# Patient Record
Sex: Female | Born: 1960 | Race: Black or African American | Hispanic: No | Smoking: Former smoker
Health system: Southern US, Community
[De-identification: ages and names within clinical notes are randomized; demographics above are authoritative.]

## PROBLEM LIST (undated history)

## (undated) DIAGNOSIS — C50919 Malignant neoplasm of unspecified site of unspecified female breast: Principal | ICD-10-CM

## (undated) DIAGNOSIS — H539 Unspecified visual disturbance: Secondary | ICD-10-CM

## (undated) DIAGNOSIS — Z923 Personal history of irradiation: Secondary | ICD-10-CM

## (undated) DIAGNOSIS — R05 Cough: Secondary | ICD-10-CM

## (undated) DIAGNOSIS — L309 Dermatitis, unspecified: Secondary | ICD-10-CM

## (undated) DIAGNOSIS — R252 Cramp and spasm: Secondary | ICD-10-CM

## (undated) DIAGNOSIS — R197 Diarrhea, unspecified: Secondary | ICD-10-CM

## (undated) DIAGNOSIS — R002 Palpitations: Secondary | ICD-10-CM

## (undated) DIAGNOSIS — F419 Anxiety disorder, unspecified: Secondary | ICD-10-CM

## (undated) DIAGNOSIS — F329 Major depressive disorder, single episode, unspecified: Secondary | ICD-10-CM

## (undated) DIAGNOSIS — K5790 Diverticulosis of intestine, part unspecified, without perforation or abscess without bleeding: Secondary | ICD-10-CM

## (undated) DIAGNOSIS — R059 Cough, unspecified: Secondary | ICD-10-CM

## (undated) DIAGNOSIS — R062 Wheezing: Secondary | ICD-10-CM

## (undated) DIAGNOSIS — J45909 Unspecified asthma, uncomplicated: Secondary | ICD-10-CM

## (undated) DIAGNOSIS — R7303 Prediabetes: Secondary | ICD-10-CM

## (undated) DIAGNOSIS — F32A Depression, unspecified: Secondary | ICD-10-CM

## (undated) DIAGNOSIS — I10 Essential (primary) hypertension: Secondary | ICD-10-CM

## (undated) HISTORY — DX: Essential (primary) hypertension: I10

## (undated) HISTORY — DX: Malignant neoplasm of unspecified site of unspecified female breast: C50.919

## (undated) HISTORY — DX: Anxiety disorder, unspecified: F41.9

## (undated) HISTORY — DX: Diverticulosis of intestine, part unspecified, without perforation or abscess without bleeding: K57.90

## (undated) HISTORY — DX: Palpitations: R00.2

## (undated) HISTORY — DX: Diarrhea, unspecified: R19.7

## (undated) HISTORY — DX: Cough: R05

## (undated) HISTORY — DX: Unspecified visual disturbance: H53.9

## (undated) HISTORY — DX: Wheezing: R06.2

## (undated) HISTORY — DX: Dermatitis, unspecified: L30.9

## (undated) HISTORY — DX: Cough, unspecified: R05.9

## (undated) HISTORY — DX: Cramp and spasm: R25.2

---

## 2001-08-13 ENCOUNTER — Ambulatory Visit (HOSPITAL_COMMUNITY): Admission: RE | Admit: 2001-08-13 | Discharge: 2001-08-13 | Payer: Self-pay | Admitting: Family Medicine

## 2001-08-13 ENCOUNTER — Encounter: Payer: Self-pay | Admitting: Family Medicine

## 2002-01-13 ENCOUNTER — Encounter: Payer: Self-pay | Admitting: Family Medicine

## 2002-01-13 ENCOUNTER — Ambulatory Visit (HOSPITAL_COMMUNITY): Admission: RE | Admit: 2002-01-13 | Discharge: 2002-01-13 | Payer: Self-pay | Admitting: Family Medicine

## 2003-02-03 ENCOUNTER — Other Ambulatory Visit: Payer: Self-pay

## 2004-08-02 ENCOUNTER — Ambulatory Visit (HOSPITAL_COMMUNITY): Admission: RE | Admit: 2004-08-02 | Discharge: 2004-08-02 | Payer: Self-pay | Admitting: Family Medicine

## 2004-12-12 ENCOUNTER — Ambulatory Visit (HOSPITAL_COMMUNITY): Admission: RE | Admit: 2004-12-12 | Discharge: 2004-12-12 | Payer: Self-pay | Admitting: Family Medicine

## 2004-12-15 ENCOUNTER — Ambulatory Visit (HOSPITAL_COMMUNITY): Admission: RE | Admit: 2004-12-15 | Discharge: 2004-12-15 | Payer: Self-pay | Admitting: Family Medicine

## 2005-01-09 ENCOUNTER — Encounter: Admission: RE | Admit: 2005-01-09 | Discharge: 2005-01-09 | Payer: Self-pay | Admitting: Family Medicine

## 2005-01-14 ENCOUNTER — Encounter: Admission: RE | Admit: 2005-01-14 | Discharge: 2005-01-14 | Payer: Self-pay | Admitting: Interventional Radiology

## 2005-09-05 ENCOUNTER — Inpatient Hospital Stay (HOSPITAL_COMMUNITY): Admission: RE | Admit: 2005-09-05 | Discharge: 2005-09-07 | Payer: Self-pay | Admitting: Obstetrics & Gynecology

## 2005-09-05 ENCOUNTER — Encounter (INDEPENDENT_AMBULATORY_CARE_PROVIDER_SITE_OTHER): Payer: Self-pay | Admitting: *Deleted

## 2005-09-10 HISTORY — PX: ABDOMINAL HYSTERECTOMY: SHX81

## 2006-09-06 ENCOUNTER — Ambulatory Visit (HOSPITAL_COMMUNITY): Admission: RE | Admit: 2006-09-06 | Discharge: 2006-09-06 | Payer: Self-pay | Admitting: Family Medicine

## 2007-03-14 DIAGNOSIS — C50919 Malignant neoplasm of unspecified site of unspecified female breast: Secondary | ICD-10-CM

## 2007-03-14 HISTORY — DX: Malignant neoplasm of unspecified site of unspecified female breast: C50.919

## 2007-07-15 ENCOUNTER — Ambulatory Visit (HOSPITAL_COMMUNITY): Admission: RE | Admit: 2007-07-15 | Discharge: 2007-07-15 | Payer: Self-pay | Admitting: Family Medicine

## 2007-07-29 ENCOUNTER — Encounter: Admission: RE | Admit: 2007-07-29 | Discharge: 2007-07-29 | Payer: Self-pay | Admitting: Family Medicine

## 2007-07-29 ENCOUNTER — Encounter (INDEPENDENT_AMBULATORY_CARE_PROVIDER_SITE_OTHER): Payer: Self-pay | Admitting: Diagnostic Radiology

## 2007-08-14 ENCOUNTER — Encounter: Admission: RE | Admit: 2007-08-14 | Discharge: 2007-08-14 | Payer: Self-pay | Admitting: Family Medicine

## 2007-09-11 HISTORY — PX: BREAST LUMPECTOMY: SHX2

## 2007-09-11 HISTORY — PX: BREAST SURGERY: SHX581

## 2007-09-17 ENCOUNTER — Encounter: Admission: RE | Admit: 2007-09-17 | Discharge: 2007-09-17 | Payer: Self-pay | Admitting: General Surgery

## 2007-09-17 ENCOUNTER — Encounter (INDEPENDENT_AMBULATORY_CARE_PROVIDER_SITE_OTHER): Payer: Self-pay | Admitting: General Surgery

## 2007-09-17 ENCOUNTER — Ambulatory Visit (HOSPITAL_COMMUNITY): Admission: RE | Admit: 2007-09-17 | Discharge: 2007-09-17 | Payer: Self-pay | Admitting: General Surgery

## 2007-10-03 ENCOUNTER — Ambulatory Visit: Payer: Self-pay | Admitting: Oncology

## 2007-10-07 LAB — COMPREHENSIVE METABOLIC PANEL
AST: 22 U/L (ref 0–37)
Albumin: 3.8 g/dL (ref 3.5–5.2)
BUN: 10 mg/dL (ref 6–23)
CO2: 27 mEq/L (ref 19–32)
Calcium: 9.4 mg/dL (ref 8.4–10.5)
Chloride: 100 mEq/L (ref 96–112)
Creatinine, Ser: 0.83 mg/dL (ref 0.40–1.20)
Potassium: 3 mEq/L — ABNORMAL LOW (ref 3.5–5.3)

## 2007-10-07 LAB — CBC WITH DIFFERENTIAL (CANCER CENTER ONLY)
BASO%: 0.5 % (ref 0.0–2.0)
LYMPH%: 39.2 % (ref 14.0–48.0)
MCH: 27.9 pg (ref 26.0–34.0)
MCV: 81 fL (ref 81–101)
MONO#: 0.3 10*3/uL (ref 0.1–0.9)
MONO%: 4.9 % (ref 0.0–13.0)
Platelets: 270 10*3/uL (ref 145–400)
RDW: 13.8 % (ref 10.5–14.6)
WBC: 5.8 10*3/uL (ref 3.9–10.0)

## 2007-10-09 ENCOUNTER — Ambulatory Visit (HOSPITAL_COMMUNITY): Admission: RE | Admit: 2007-10-09 | Discharge: 2007-10-09 | Payer: Self-pay | Admitting: Oncology

## 2007-10-15 ENCOUNTER — Ambulatory Visit: Admission: RE | Admit: 2007-10-15 | Discharge: 2008-01-09 | Payer: Self-pay | Admitting: Radiation Oncology

## 2007-10-29 ENCOUNTER — Ambulatory Visit (HOSPITAL_COMMUNITY): Admission: RE | Admit: 2007-10-29 | Discharge: 2007-10-29 | Payer: Self-pay | Admitting: Family Medicine

## 2007-11-07 LAB — CBC WITH DIFFERENTIAL (CANCER CENTER ONLY)
BASO%: 0.5 % (ref 0.0–2.0)
LYMPH#: 1.8 10*3/uL (ref 0.9–3.3)
MONO#: 0.4 10*3/uL (ref 0.1–0.9)
Platelets: 236 10*3/uL (ref 145–400)
RDW: 13.7 % (ref 10.5–14.6)
WBC: 5.6 10*3/uL (ref 3.9–10.0)

## 2007-11-12 LAB — HYPERCOAGULABLE PANEL, COMPREHENSIVE RET.
AntiThromb III Func: 107 % (ref 76–126)
Anticardiolipin IgA: 10 [APL'U] (ref <13)
Anticardiolipin IgG: <7 [GPL'U] (ref <11)
Anticardiolipin IgM: <7 [MPL'U] (ref <10)
Beta-2 Glyco I IgG: 38 U/mL (ref <20)
DRVVT: 45.9 secs (ref 36.1–47.0)
PTT Lupus Anticoagulant: 70.5 secs — ABNORMAL HIGH (ref 36.3–48.8)
PTTLA 4:1 Mix: 56.2 secs — ABNORMAL HIGH (ref 36.3–48.8)
PTTLA Confirmation: 4.1 secs (ref <8.0)
Protein C Activity: 157 % — ABNORMAL HIGH (ref 75–133)
Protein C, Total: 94 % (ref 70–140)

## 2008-01-20 ENCOUNTER — Ambulatory Visit: Payer: Self-pay | Admitting: Oncology

## 2008-01-21 LAB — CMP (CANCER CENTER ONLY)
ALT(SGPT): 25 U/L (ref 10–47)
BUN, Bld: 13 mg/dL (ref 7–22)
CO2: 32 mEq/L (ref 18–33)
Creat: 0.9 mg/dl (ref 0.6–1.2)
Total Bilirubin: 0.9 mg/dl (ref 0.20–1.60)

## 2008-01-21 LAB — CBC WITH DIFFERENTIAL (CANCER CENTER ONLY)
EOS%: 3.9 % (ref 0.0–7.0)
Eosinophils Absolute: 0.2 10*3/uL (ref 0.0–0.5)
LYMPH%: 33.9 % (ref 14.0–48.0)
MCH: 28 pg (ref 26.0–34.0)
MCHC: 34 g/dL (ref 32.0–36.0)
MCV: 82 fL (ref 81–101)
MONO%: 11 % (ref 0.0–13.0)
Platelets: 266 10*3/uL (ref 145–400)
RBC: 5 10*6/uL (ref 3.70–5.32)

## 2008-01-23 LAB — LUPUS ANTICOAGULANT PANEL
DRVVT 1:1 Mix: 41.9 secs (ref 36.1–47.0)
DRVVT: 73 secs — ABNORMAL HIGH (ref 36.1–47.0)
PTT Lupus Anticoagulant: 106.7 secs — ABNORMAL HIGH (ref 36.3–48.8)
PTTLA Confirmation: 6.3 secs (ref <8.0)

## 2008-01-23 LAB — BETA-2 GLYCOPROTEIN ANTIBODIES: Beta-2 Glyco I IgG: <4 U/mL (ref <20)

## 2008-02-25 LAB — CMP (CANCER CENTER ONLY)
AST: 23 U/L (ref 11–38)
Albumin: 4.1 g/dL (ref 3.3–5.5)
Alkaline Phosphatase: 68 U/L (ref 26–84)
Potassium: 3.6 mEq/L (ref 3.3–4.7)
Sodium: 142 mEq/L (ref 128–145)
Total Bilirubin: 0.7 mg/dl (ref 0.20–1.60)
Total Protein: 7.9 g/dL (ref 6.4–8.1)

## 2008-02-25 LAB — CBC WITH DIFFERENTIAL (CANCER CENTER ONLY)
BASO#: 0 10*3/uL (ref 0.0–0.2)
Eosinophils Absolute: 0.3 10*3/uL (ref 0.0–0.5)
HCT: 39.2 % (ref 34.8–46.6)
HGB: 13.1 g/dL (ref 11.6–15.9)
LYMPH%: 30.1 % (ref 14.0–48.0)
MCH: 27.6 pg (ref 26.0–34.0)
MCHC: 33.5 g/dL (ref 32.0–36.0)
MCV: 82 fL (ref 81–101)
MONO%: 6.9 % (ref 0.0–13.0)
NEUT%: 55.5 % (ref 39.6–80.0)
RBC: 4.76 10*6/uL (ref 3.70–5.32)

## 2008-02-28 ENCOUNTER — Ambulatory Visit: Payer: Self-pay | Admitting: Genetic Counselor

## 2008-03-02 LAB — LUPUS ANTICOAGULANT PANEL
DRVVT: 46.2 secs (ref 36.1–47.0)
PTTLA 4:1 Mix: 59.2 secs — ABNORMAL HIGH (ref 36.3–48.8)
PTTLA Confirmation: 5.3 secs (ref <8.0)

## 2008-03-02 LAB — CARDIOLIPIN ANTIBODIES, IGG, IGM, IGA: Anticardiolipin IgA: 8 [APL'U] (ref <13)

## 2008-03-19 ENCOUNTER — Ambulatory Visit (HOSPITAL_COMMUNITY): Admission: RE | Admit: 2008-03-19 | Discharge: 2008-03-19 | Payer: Self-pay | Admitting: Oncology

## 2008-03-23 ENCOUNTER — Ambulatory Visit: Payer: Self-pay | Admitting: Oncology

## 2008-05-13 ENCOUNTER — Ambulatory Visit: Payer: Self-pay | Admitting: Oncology

## 2008-05-14 LAB — CBC WITH DIFFERENTIAL (CANCER CENTER ONLY)
BASO#: 0 10*3/uL (ref 0.0–0.2)
Eosinophils Absolute: 0.2 10*3/uL (ref 0.0–0.5)
HCT: 39.8 % (ref 34.8–46.6)
HGB: 13.5 g/dL (ref 11.6–15.9)
LYMPH#: 1.5 10*3/uL (ref 0.9–3.3)
NEUT#: 2.8 10*3/uL (ref 1.5–6.5)
NEUT%: 57.8 % (ref 39.6–80.0)
RBC: 4.89 10*6/uL (ref 3.70–5.32)

## 2008-05-14 LAB — CMP (CANCER CENTER ONLY)
Albumin: 3.8 g/dL (ref 3.3–5.5)
CO2: 30 mEq/L (ref 18–33)
Chloride: 99 mEq/L (ref 98–108)
Glucose, Bld: 102 mg/dL (ref 73–118)
Potassium: 3.4 mEq/L (ref 3.3–4.7)
Sodium: 138 mEq/L (ref 128–145)
Total Protein: 8.4 g/dL — ABNORMAL HIGH (ref 6.4–8.1)

## 2008-07-30 ENCOUNTER — Ambulatory Visit (HOSPITAL_COMMUNITY): Admission: RE | Admit: 2008-07-30 | Discharge: 2008-07-30 | Payer: Self-pay | Admitting: Oncology

## 2008-08-21 ENCOUNTER — Emergency Department (HOSPITAL_COMMUNITY): Admission: EM | Admit: 2008-08-21 | Discharge: 2008-08-21 | Payer: Self-pay | Admitting: Emergency Medicine

## 2008-11-10 ENCOUNTER — Ambulatory Visit: Payer: Self-pay | Admitting: Oncology

## 2008-11-18 LAB — CBC WITH DIFFERENTIAL (CANCER CENTER ONLY)
BASO#: 0 10*3/uL (ref 0.0–0.2)
BASO%: 0.8 % (ref 0.0–2.0)
EOS%: 3.9 % (ref 0.0–7.0)
HCT: 39.4 % (ref 34.8–46.6)
HGB: 13.1 g/dL (ref 11.6–15.9)
LYMPH#: 1.9 10*3/uL (ref 0.9–3.3)
MCHC: 33.2 g/dL (ref 32.0–36.0)
MONO#: 0.4 10*3/uL (ref 0.1–0.9)
NEUT#: 2.4 10*3/uL (ref 1.5–6.5)
NEUT%: 49.2 % (ref 39.6–80.0)
RDW: 12.8 % (ref 10.5–14.6)
WBC: 4.8 10*3/uL (ref 3.9–10.0)

## 2008-11-18 LAB — CMP (CANCER CENTER ONLY)
BUN, Bld: 9 mg/dL (ref 7–22)
CO2: 30 mEq/L (ref 18–33)
Creat: 0.8 mg/dl (ref 0.6–1.2)
Glucose, Bld: 96 mg/dL (ref 73–118)
Total Bilirubin: 0.6 mg/dl (ref 0.20–1.60)

## 2009-05-12 ENCOUNTER — Ambulatory Visit: Payer: Self-pay | Admitting: Oncology

## 2009-05-18 LAB — CBC WITH DIFFERENTIAL (CANCER CENTER ONLY)
BASO#: 0 10*3/uL (ref 0.0–0.2)
BASO%: 0.6 % (ref 0.0–2.0)
EOS%: 3 % (ref 0.0–7.0)
HGB: 13.4 g/dL (ref 11.6–15.9)
LYMPH#: 2.2 10*3/uL (ref 0.9–3.3)
MCHC: 33.5 g/dL (ref 32.0–36.0)
NEUT#: 2.3 10*3/uL (ref 1.5–6.5)

## 2009-05-18 LAB — CMP (CANCER CENTER ONLY)
ALT(SGPT): 22 U/L (ref 10–47)
AST: 24 U/L (ref 11–38)
Albumin: 3.7 g/dL (ref 3.3–5.5)
BUN, Bld: 13 mg/dL (ref 7–22)
Calcium: 9 mg/dL (ref 8.0–10.3)
Chloride: 99 mEq/L (ref 98–108)
Potassium: 3.1 mEq/L — ABNORMAL LOW (ref 3.3–4.7)

## 2009-08-04 ENCOUNTER — Ambulatory Visit (HOSPITAL_COMMUNITY): Admission: RE | Admit: 2009-08-04 | Discharge: 2009-08-04 | Payer: Self-pay | Admitting: Family Medicine

## 2009-11-11 ENCOUNTER — Ambulatory Visit: Payer: Self-pay | Admitting: Oncology

## 2009-11-18 LAB — CMP (CANCER CENTER ONLY)
AST: 20 U/L (ref 11–38)
Albumin: 4 g/dL (ref 3.3–5.5)
BUN, Bld: 11 mg/dL (ref 7–22)
CO2: 29 mEq/L (ref 18–33)
Calcium: 9.3 mg/dL (ref 8.0–10.3)
Chloride: 97 mEq/L — ABNORMAL LOW (ref 98–108)
Glucose, Bld: 90 mg/dL (ref 73–118)
Potassium: 3.9 mEq/L (ref 3.3–4.7)

## 2009-11-18 LAB — CBC WITH DIFFERENTIAL (CANCER CENTER ONLY)
BASO#: 0 10*3/uL (ref 0.0–0.2)
Eosinophils Absolute: 0.2 10*3/uL (ref 0.0–0.5)
HGB: 13 g/dL (ref 11.6–15.9)
LYMPH#: 1.9 10*3/uL (ref 0.9–3.3)
MONO#: 0.3 10*3/uL (ref 0.1–0.9)
NEUT#: 2.2 10*3/uL (ref 1.5–6.5)
RBC: 4.56 10*6/uL (ref 3.70–5.32)

## 2009-12-06 ENCOUNTER — Emergency Department (HOSPITAL_COMMUNITY): Admission: EM | Admit: 2009-12-06 | Discharge: 2009-12-06 | Payer: Self-pay | Admitting: Emergency Medicine

## 2009-12-08 ENCOUNTER — Ambulatory Visit (HOSPITAL_COMMUNITY): Admission: RE | Admit: 2009-12-08 | Discharge: 2009-12-08 | Payer: Self-pay | Admitting: Cardiology

## 2009-12-08 ENCOUNTER — Ambulatory Visit: Payer: Self-pay | Admitting: Cardiology

## 2009-12-08 ENCOUNTER — Encounter: Payer: Self-pay | Admitting: Cardiology

## 2009-12-13 ENCOUNTER — Ambulatory Visit: Payer: Self-pay | Admitting: Cardiology

## 2009-12-13 DIAGNOSIS — C50512 Malignant neoplasm of lower-outer quadrant of left female breast: Secondary | ICD-10-CM | POA: Insufficient documentation

## 2009-12-13 DIAGNOSIS — F411 Generalized anxiety disorder: Secondary | ICD-10-CM | POA: Insufficient documentation

## 2009-12-13 DIAGNOSIS — R079 Chest pain, unspecified: Secondary | ICD-10-CM | POA: Insufficient documentation

## 2009-12-13 LAB — CONVERTED CEMR LAB
CO2: 28 meq/L
Calcium: 9.2 mg/dL
Glucose, Bld: 96 mg/dL
Hemoglobin: 13.7 g/dL
MCV: 85 fL
Platelets: 200 10*3/uL
WBC: 5.3 10*3/uL

## 2010-04-03 ENCOUNTER — Encounter: Payer: Self-pay | Admitting: Family Medicine

## 2010-04-12 NOTE — Assessment & Plan Note (Signed)
Summary: post ED visit on 12/06/09/ED MD spoke w/Dr.Rothbart/tg   Visit Type:  Initial Consult Primary Provider:  Dr. John Giovanni   History of Present Illness: [name] is seen at the kind request of Dr. Bebe Shaggy for evaluation of chest discomfort.  She was evaluated in the emergency department approximately one week ago for this problem.  A stress echocardiogram was arranged, and yielded normal findings.  She has not had significant symptoms since.  Onset of chest discomfort occurred in the afternoon while the patient was at her place of employment  and at rest.  She noted no specific stressor at that time, but has experienced substantial anxiety of late, primarily due to marital issues.  She developed moderately severe substernal chest burning and pressure without radiation and without associated dyspnea or diaphoresis.  She had some nausea and difficulty swallowing with a sense of fullness in her throat.   Discomfort was not pleuritic.  Symptoms were unaffected by physical activity or movement of the trunk and upper extremities.  She now reports that symptoms lasted for some time, but this was said to be a 10 minute episode when she was seen in the emergency department.  EKG and cardiac markers were negative  She has no history of hypertension, diabetes, tobacco use or hyperlipidemia.  Current Medications (verified): 1)  Hydrochlorothiazide 50 Mg Tabs (Hydrochlorothiazide) .... Take 1/2 Tab Daily 2)  Klor-Con M20 20 Meq Cr-Tabs (Potassium Chloride Crys Cr) .... Take 1 Tab Daily 3)  Tamoxifen Citrate 10 Mg Tabs (Tamoxifen Citrate) .... Take 1 Tab At Bedtime 4)  Lorazepam 2 Mg Tabs (Lorazepam) .... Take 1/2 To 1 Tab Am Then 1 Tab At Bedtime 5)  Magnesium 300 Mg Caps (Magnesium) .... Take 1 Tab Daily 6)  Venlafaxine Hcl 25 Mg Tabs (Venlafaxine Hcl) .... Take 1/2 of Tab Daily 7)  Biotin 300 Mcg Tabs (Biotin) .... Take 1 Tab Daily 8)  Aspir-Low 81 Mg Tbec (Aspirin) .... Take 1 Tab  Daily  Allergies (verified): 1)  ! Erythromycin 2)  ! Penicillin 3)  ! Tetracycline  Comments:  Nurse/Medical Assistant: patient didn't bring meds or list patient stated meds post ed visit per Dr.Rothbarts talk with ed docter.  Past History:  Past Surgical History: Last updated: 12/13/2009 Partial hysterectomy for benign disease-2009 Excisional left breast biopsy for neoplastic disease-09/17/07  Family History: Last updated: 12/13/2009 Mother: diabetes; history of CHF Father: Alive and well Siblings: Sister with diabetes; one of 2 brothers has a history of MI Positive family history among the uncles of CAD and hypertension  Social History: Last updated: 12/13/2009 Tobacco Use - No.  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 No tracing for comparison.   Family History: Mother: diabetes; history of CHF Father: Alive and well Siblings: Sister with diabetes; one of 2 brothers has a history of MI Positive family history among the uncles of CAD and hypertension  Social History: Tobacco Use - No.  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 Marital: married x 2.5 years; has been recently served 10 days in jail, uses illicit substances, and is unemployed  Review of Systems       Corrective lenses required; partial upper dentures; occasional palpitations; previous episode of  colitis; arthritic discomfort in the knees; history of mild asthma.  All other systems reviewed and are negative.  Vital Signs:  Patient profile:   50 year old female Height:      69 inches Weight:      262 pounds BMI:     38.83 Pulse rate:   94 / minute BP sitting:   142 / 89  (right arm)  Vitals Entered By: Dreama Saa, CNA (December 13, 2009 11:43 AM)  Physical Exam  General:  Obese; well-developed; no acute distress: HEENT-[place]/AT; PERRL; EOM intact; conjunctiva and lids nl:  Neck-No JVD; no carotid bruits: Endocrine-No thyromegaly: Lungs-No tachypnea, clear without rales, rhonchi or wheezes: CV-normal PMI; normal S1 and S2:;  Abdomen-BS normal; soft and non-tender without masses or organomegaly: MS-No deformities, cyanosis or clubbing: Neurologic-Nl cranial nerves; symmetric strength and tone: Skin- Warm, no sig. lesions: Extremities-Nl distal pulses; no edema  Eyes:  cataract OD.     Impression & Recommendations:  Problem # 1:  CHEST PAIN (ICD-786.50) Chest pain is atypical, and risk of coronary disease is extremely low, particularly after a negative stress echocardiogram.  No further diagnostic testing is necessary.  I suggested treatment with lorazepam on a p.r.n. basis, utilizing a dose of 0.5-1 mg.  Problem # 2:  HYPERTENSION (ICD-401.1) Blood pressure control is good.  Current medication will be continued.  Problem # 3:  ANXIETY (ICD-300.00) Anxiety may well be the etiology for her symptoms.  Use of additional lorazepam as needed was suggested.  I also recommended psychologic counseling, either with Dr. Sudie Bailey or behavioral health.  [name] is self-reliant and not inclined to accept this service at the present time.  I would be happy to see her again should she develop problems in the future with which I can assist.  Other Orders: EKG w/ Interpretation (93000)  Patient Instructions: 1)  Your physician recommends that you schedule a follow-up appointment in:  as needed

## 2010-05-26 LAB — CBC
Platelets: 200 10*3/uL (ref 150–400)
RDW: 13.6 % (ref 11.5–15.5)
WBC: 5.3 10*3/uL (ref 4.0–10.5)

## 2010-05-26 LAB — POCT CARDIAC MARKERS
CKMB, poc: <1 ng/mL — ABNORMAL LOW (ref 1.0–8.0)
Myoglobin, poc: 52.3 ng/mL (ref 12–200)
Troponin i, poc: <0.05 ng/mL (ref 0.00–0.09)
Troponin i, poc: <0.05 ng/mL (ref 0.00–0.09)

## 2010-05-26 LAB — DIFFERENTIAL
Basophils Absolute: 0 10*3/uL (ref 0.0–0.1)
Lymphocytes Relative: 38 % (ref 12–46)
Neutro Abs: 2.9 10*3/uL (ref 1.7–7.7)

## 2010-05-26 LAB — BASIC METABOLIC PANEL
Calcium: 9.2 mg/dL (ref 8.4–10.5)
Creatinine, Ser: 0.72 mg/dL (ref 0.4–1.2)
GFR calc non Af Amer: >60 mL/min (ref >60)

## 2010-06-13 ENCOUNTER — Other Ambulatory Visit (HOSPITAL_COMMUNITY): Payer: Self-pay | Admitting: Family Medicine

## 2010-06-13 DIAGNOSIS — H5704 Mydriasis: Secondary | ICD-10-CM

## 2010-06-14 ENCOUNTER — Other Ambulatory Visit (HOSPITAL_COMMUNITY): Payer: Self-pay | Admitting: Family Medicine

## 2010-06-14 ENCOUNTER — Ambulatory Visit (HOSPITAL_COMMUNITY)
Admission: RE | Admit: 2010-06-14 | Discharge: 2010-06-14 | Disposition: A | Discharge status: Home / Self Care | Payer: 59 | Source: Ambulatory Visit | Attending: Family Medicine | Admitting: Family Medicine

## 2010-06-14 DIAGNOSIS — H5460 Unqualified visual loss, one eye, unspecified: Secondary | ICD-10-CM | POA: Insufficient documentation

## 2010-06-14 DIAGNOSIS — R51 Headache: Secondary | ICD-10-CM | POA: Insufficient documentation

## 2010-06-14 DIAGNOSIS — H5704 Mydriasis: Secondary | ICD-10-CM | POA: Insufficient documentation

## 2010-06-14 DIAGNOSIS — Z853 Personal history of malignant neoplasm of breast: Secondary | ICD-10-CM | POA: Insufficient documentation

## 2010-06-14 MED ORDER — GADOBENATE DIMEGLUMINE 529 MG/ML IV SOLN
20.0000 mL | Freq: Once | INTRAVENOUS | Status: AC | PRN
  Administered 2010-06-14: 20 mL via INTRAVENOUS

## 2010-06-15 ENCOUNTER — Other Ambulatory Visit (HOSPITAL_COMMUNITY): Payer: Self-pay | Admitting: Family Medicine

## 2010-06-15 DIAGNOSIS — Z9889 Other specified postprocedural states: Secondary | ICD-10-CM

## 2010-06-20 LAB — POCT CARDIAC MARKERS
CKMB, poc: <1 ng/mL — ABNORMAL LOW (ref 1.0–8.0)
Myoglobin, poc: 60.8 ng/mL (ref 12–200)
Troponin i, poc: <0.05 ng/mL (ref 0.00–0.09)

## 2010-06-20 LAB — DIFFERENTIAL
Basophils Absolute: 0 10*3/uL (ref 0.0–0.1)
Lymphocytes Relative: 37 % (ref 12–46)
Neutro Abs: 2.5 10*3/uL (ref 1.7–7.7)
Neutrophils Relative %: 53 % (ref 43–77)

## 2010-06-20 LAB — CBC
HCT: 39.7 % (ref 36.0–46.0)
Hemoglobin: 13.9 g/dL (ref 12.0–15.0)
MCV: 85.1 fL (ref 78.0–100.0)
Platelets: 227 10*3/uL (ref 150–400)
RBC: 4.67 MIL/uL (ref 3.87–5.11)
RDW: 13.9 % (ref 11.5–15.5)
WBC: 4.8 10*3/uL (ref 4.0–10.5)

## 2010-06-20 LAB — BASIC METABOLIC PANEL
BUN: 10 mg/dL (ref 6–23)
Chloride: 100 mEq/L (ref 96–112)
Creatinine, Ser: 0.75 mg/dL (ref 0.4–1.2)

## 2010-07-26 NOTE — Op Note (Signed)
NAMEESPERANSA, [name] NO.:  [id number]   MEDICAL RECORD NO.:  [id number]          PATIENT TYPE:  AMB   LOCATION:  SDS                          FACILITY:  [facility]   PHYSICIAN:  Lennie Muckle, MD      DATE OF BIRTH:  [birth year]   DATE OF PROCEDURE:  09/17/2007  DATE OF DISCHARGE:  09/17/2007                               OPERATIVE REPORT   PROCEDURE:  Left breast but needle-localized lumpectomy.   PREOPERATIVE DIAGNOSIS:  Ductal carcinoma in situ.   POSTOPERATIVE DIAGNOSIS:  Ductal carcinoma in situ.   SURGEON:  Amber L. Freida Busman, MD   ASSISTANT:  Marina Gravel   INDICATIONS FOR PROCEDURE:  [name] is a 50 year old female who was  found to have calcifications on screening mammogram.  She has had a core  biopsy which revealed high-grade DCIS.  The area measured 3 x 2.  I  discussed with the patient that this need to be excised.  Due to the  area of not being palpable, I have requested to have needle  localization.  Informed consent was obtained prior to procedure.   PROCEDURE IN DETAILS:  [name] was identified in the preoperative  holding area.  She received vancomycin IV.  She was then taken to the  operating room and placed in supine position.  After administration of  general endotracheal anesthesia, her left breast was prepped and draped  in the usual sterile fashion.  A time-out procedure indicating the  patient and procedure were performed.  I had viewed the mammogram with  the wire in place as well as the MRI for identification of  calcifications.  This was noted to be immediately medial and inferior to  the nipple.  I placed an incision just around the vicinity of the wire.  Then using the wire as a guide, I used both blunt dissection as well as  electrocautery and knife in order to gain entry into the deeper tissues.  I was able to find the distal portion of the wire and performed a  lumpectomy around the distal portion of the wire using  electrocautery  and sharp dissection with a #10 blade.  The specimen was kept oriented  during the procedure.  I placed a suture for long lateral, one was short  superior, and the double short was deep suture and the wire marked in  anterior margin.  The specimen was then sent to radiology.  It did  contain the wire and the calcifications.  There was an area superiorly  which was slightly hard and which I had to excise for permanent just to  get a broader, deeper, and superior portion of the tissue.  The area was  irrigated.  Hemostasis was achieved with electrocautery.  I then  reapproximated the breast tissue with 3-0 Vicryl suture.  The dermis was  closed with 3-0 Vicryl suture and skin was  closed with 4-0 Monocryl.  Lidocaine 0.25% was injected in the  subcutaneous tissues and Dermabond was placed for final dressing.  The  patient was extubated and transferred to postanesthesia care unit in  stable condition.  She will follow up with me in approximately 2-3  weeks.  I will call her as soon as I have the pathology results.      Lennie Muckle, MD  Electronically Signed     ALA/MEDQ  D:  09/17/2007  T:  09/18/2007  Job:  [id number]   cc:   Mila Homer. Sudie Bailey, M.D.

## 2010-07-29 NOTE — Op Note (Signed)
[name], [name] NO.:  [id number]   MEDICAL RECORD NO.:  [id number]          PATIENT TYPE:  INP   LOCATION:  9314                          FACILITY:  [facility]   PHYSICIAN:  Ilda Mori, M.D.   DATE OF BIRTH:  Feb 19, 1961   DATE OF PROCEDURE:  09/05/2005  DATE OF DISCHARGE:                                 OPERATIVE REPORT   PREOPERATIVE DIAGNOSIS:  Myoma uteri.   POSTOPERATIVE DIAGNOSIS:  Myoma uteri.   PROCEDURE:  Subtotal abdominal hysterectomy.   SURGEON:  Ilda Mori, M.D.   ASSISTANT:  Malva Limes, M.D.   ANESTHESIA:  General.   ESTIMATED BLOOD LOSS:  250 mL.   SPECIMENS:  Uterus and myoma weighing approximately 250 grams.   FINDINGS:  Uterine myoma.  There were no pelvic adhesions noted.  The  ovaries and tubes looked normal.   COMPLICATIONS:  None.   INDICATIONS:  This is a 50 year old nulligravida female with known myoma.  The patient stated that the fibroids were causing her pain and discomfort  and requested that they be removed.  The options of myomectomy and uterine  artery embolization were discussed with the patient as possible  alternatives.  The decision was reached to perform a subtotal of abdominal  hysterectomy which would minimize her risks of interoperative or  postoperative complications.   PROCEDURE:  The patient was taken to the operating room and placed in the  supine position.  General anesthesia was induced.  She was then placed in  the frog-leg position.  The abdomen, perineum and vagina were prepped and  draped in a sterile fashion.  The bladder was catheterized.  The patient was  then placed back in the supine position and the abdomen was prepped and  draped in a sterile fashion.  A low transverse incision was made and carried  down to the fascia which was extended transversely.  The rectus muscle was  divided from the overlying rectus fascia.  The midline was identified and  the peritoneum was entered by sharp  and blunt dissection. The peritoneal  incision was extended vertically.  The abdomen was explored and no masses  were palpable.  A self-retaining retractor was then placed and the bowel was  packed with lap pad.  The uterus was grasped with a towel clip through a  fibroid and elevated. The round ligament was cauterized and dissected free.  The anterior serosa was then excised and the bladder was displaced  inferiorly.  The coronary artery was clamped and cut and the uterine ovarian  anastomosis was divided.  The uterines were then identified, clamped, and  ligated. At this point, the fundus and myoma were removed sharply.  The  endocervical canal was identified and thoroughly cauterized.  The cervical  stump was then closed with figure-of-eight sutures and hemostasis was noted  present.  The self-retaining retractor and the lap pads were removed.  Sponge count was correct.  The peritoneum was then closed with running 0  Vicryl suture, the rectus muscle was reapposed in the midline with 0 Vicryl  suture, the fascia was closed with running 0  Vicryl suture, the subcutaneous  tissue was greater than 3 cm and closed with interrupted 2-0 plain catgut  suture, the skin was closed with a 4-0 Dexon suture in subcuticular fashion.  The patient tolerated the procedure well and left the operating room in good  condition.      Ilda Mori, M.D.  Electronically Signed     RK/MEDQ  D:  09/05/2005  T:  09/05/2005  Job:  [id number]

## 2010-07-29 NOTE — H&P (Signed)
[name], [name] NO.:  [id number]   MEDICAL RECORD NO.:  [id number]          PATIENT TYPE:  AMB   LOCATION:  SDC                           FACILITY[facility]   PHYSICIAN:  Ilda Mori, M.D.   DATE OF BIRTH:  09/06/1960   DATE OF ADMISSION:  09/05/2005  DATE OF DISCHARGE:                                HISTORY & PHYSICAL   CHIEF COMPLAINT:  Myoma uteri.   This is a 50 year old nulligravid female who presented to the office in  November of 2006, with complaints of abdominal swelling and heaviness.  The  patient had been followed by another physician who diagnosed multiple myoma  on CAT scan and MRI of the pelvis.  The findings were discussed with the  patient and alternative forms of treatment including uterine embolization  were discussed.  The patient presented to my office for a second opinion  where we rediscussed the findings.  The patient requested a hysterectomy be  performed.  She felt this was the best way to decrease the abdominal mass.  She understood that the hysterectomy would prevent her from  having any  children.  The patient elected to cancel the surgery in January of 2007.  She called again in June of 2007, stating that she once again felt that the  symptoms were significant enough that the benefit of surgery in her mind  outweighed the risks.  It was explained to the patient that it could not be  certain that the feelings of swelling and fullness that she had were  necessarily related to the fibroids and that surgery was certainly an option  but not necessary for her.  In addition, because of the patient's concern  over risks, the option of leaving the cervix behind and decreasing potential  bladder and ureteral complications was also discussed.   ALLERGIES:  CECLOR which causes her indigestion.   PAST MEDICAL HISTORY:  1.  She has hypertension for which she is on hydrochlorothiazide and      Anafranil.  2.  She has mild anxiety for which  she takes Lorazepam.   PAST SURGICAL HISTORY:  She has had no previous surgeries.   She has no psychological problems.   SOCIAL HISTORY:  She does not exercise regularly, she does not smoke and  does not drink.  She works in [employer], broadcasting.  She is not married at this  time.   FAMILY HISTORY:  Positive for heart disease in her mother's brothers and  hypertension on her mother's side of the family.  Her mother and sister have  diabetes.  Her paternal grandmother suffered from depression.   REVIEW OF SYSTEMS:  Negative.   PHYSICAL EXAMINATION:  GENERAL APPEARANCE:  She is 5 feet 9 inches tall, 250  pounds.  VITAL SIGNS:  Afebrile.  Her blood pressure is 122/78.  HEENT:  Her ears, nose and throat are normal.  NECK:  Her neck is supple without thyromegaly.  BREASTS:  Without masses.  LUNGS:  Clear to auscultation and percussion.  CARDIOVASCULAR:  Regular sinus rhythm without murmurs or gallops.  ABDOMEN:  Soft.  There was a 14 to 16-week size myoma.  PELVIC:  Vagina and vulva were normal.  Her cervix appeared normal.  Pelvic  exam revealed a 14-week pelvic mass and her adnexa were without enlarged  masses although difficult to palpate secondary to the fibroids.  The risks  and benefits of surgery were discussed with the patient extensively and we  elected to proceed with a abdominal supracervical hysterectomy which was  felt to be the surgery that was least likely to cause complications and most  likely to resolve the patient's issues of the symptoms from her fibroid  tumors.   PLAN:  Proceed with supracervical abdominal hysterectomy.      Ilda Mori, M.D.  Electronically Signed     RK/MEDQ  D:  09/04/2005  T:  09/04/2005  Job:  [id number]

## 2010-08-10 ENCOUNTER — Other Ambulatory Visit: Payer: Self-pay | Admitting: Oncology

## 2010-08-10 ENCOUNTER — Ambulatory Visit (HOSPITAL_COMMUNITY)
Admission: RE | Admit: 2010-08-10 | Discharge: 2010-08-10 | Disposition: A | Discharge status: Home / Self Care | Payer: 59 | Source: Ambulatory Visit | Attending: Family Medicine | Admitting: Family Medicine

## 2010-08-10 ENCOUNTER — Encounter (HOSPITAL_BASED_OUTPATIENT_CLINIC_OR_DEPARTMENT_OTHER): Payer: 59 | Admitting: Oncology

## 2010-08-10 DIAGNOSIS — Z853 Personal history of malignant neoplasm of breast: Secondary | ICD-10-CM | POA: Insufficient documentation

## 2010-08-10 DIAGNOSIS — Z9889 Other specified postprocedural states: Secondary | ICD-10-CM

## 2010-08-10 DIAGNOSIS — C50919 Malignant neoplasm of unspecified site of unspecified female breast: Secondary | ICD-10-CM

## 2010-08-10 DIAGNOSIS — D0590 Unspecified type of carcinoma in situ of unspecified breast: Secondary | ICD-10-CM

## 2010-08-10 LAB — CBC WITH DIFFERENTIAL/PLATELET
BASO%: 0.4 % (ref 0.0–2.0)
EOS%: 2.9 % (ref 0.0–7.0)
HCT: 39.1 % (ref 34.8–46.6)
MCH: 28.6 pg (ref 25.1–34.0)
MCHC: 33.8 g/dL (ref 31.5–36.0)
NEUT%: 49.2 % (ref 38.4–76.8)
RDW: 13.3 % (ref 11.2–14.5)
lymph#: 2.1 10*3/uL (ref 0.9–3.3)

## 2010-08-10 LAB — COMPREHENSIVE METABOLIC PANEL
ALT: 8 U/L (ref 0–35)
AST: 14 U/L (ref 0–37)
Calcium: 9.4 mg/dL (ref 8.4–10.5)
Chloride: 104 mEq/L (ref 96–112)
Creatinine, Ser: 0.76 mg/dL (ref 0.40–1.20)

## 2010-11-23 ENCOUNTER — Other Ambulatory Visit (HOSPITAL_COMMUNITY): Payer: Self-pay | Admitting: Family Medicine

## 2010-11-23 DIAGNOSIS — C50919 Malignant neoplasm of unspecified site of unspecified female breast: Secondary | ICD-10-CM

## 2010-12-07 ENCOUNTER — Other Ambulatory Visit (HOSPITAL_COMMUNITY): Payer: Self-pay | Admitting: Family Medicine

## 2010-12-07 ENCOUNTER — Ambulatory Visit (HOSPITAL_COMMUNITY)
Admission: RE | Admit: 2010-12-07 | Discharge: 2010-12-07 | Disposition: A | Discharge status: Home / Self Care | Payer: 59 | Source: Ambulatory Visit | Attending: Family Medicine | Admitting: Family Medicine

## 2010-12-07 DIAGNOSIS — C50919 Malignant neoplasm of unspecified site of unspecified female breast: Secondary | ICD-10-CM

## 2010-12-07 DIAGNOSIS — N6459 Other signs and symptoms in breast: Secondary | ICD-10-CM | POA: Insufficient documentation

## 2010-12-08 LAB — CBC
HCT: 37.8
Hemoglobin: 13.3
WBC: 5.7

## 2010-12-08 LAB — DIFFERENTIAL
Eosinophils Relative: 3
Lymphocytes Relative: 37
Lymphs Abs: 2.1
Monocytes Absolute: 0.4

## 2010-12-08 LAB — BASIC METABOLIC PANEL
BUN: 10
CO2: 26
Chloride: 100
Creatinine, Ser: 0.7
GFR calc Af Amer: >60

## 2011-02-08 ENCOUNTER — Encounter: Payer: Self-pay | Admitting: Oncology

## 2011-02-08 ENCOUNTER — Ambulatory Visit (HOSPITAL_BASED_OUTPATIENT_CLINIC_OR_DEPARTMENT_OTHER): Payer: BC Managed Care – PPO | Admitting: Oncology

## 2011-02-08 ENCOUNTER — Other Ambulatory Visit: Payer: Self-pay | Admitting: *Deleted

## 2011-02-08 ENCOUNTER — Other Ambulatory Visit: Payer: Self-pay

## 2011-02-08 ENCOUNTER — Other Ambulatory Visit (HOSPITAL_BASED_OUTPATIENT_CLINIC_OR_DEPARTMENT_OTHER): Payer: BC Managed Care – PPO | Admitting: Lab

## 2011-02-08 ENCOUNTER — Telehealth: Payer: Self-pay | Admitting: Oncology

## 2011-02-08 VITALS — BP 136/87 | HR 92 | Temp 98.2°F | Ht 67.5 in | Wt 282.1 lb

## 2011-02-08 DIAGNOSIS — Z170 Estrogen receptor positive status [ER+]: Secondary | ICD-10-CM

## 2011-02-08 DIAGNOSIS — L298 Other pruritus: Secondary | ICD-10-CM

## 2011-02-08 DIAGNOSIS — D0590 Unspecified type of carcinoma in situ of unspecified breast: Secondary | ICD-10-CM

## 2011-02-08 DIAGNOSIS — C50919 Malignant neoplasm of unspecified site of unspecified female breast: Secondary | ICD-10-CM

## 2011-02-08 DIAGNOSIS — Z79899 Other long term (current) drug therapy: Secondary | ICD-10-CM

## 2011-02-08 LAB — LACTATE DEHYDROGENASE: LDH: 162 U/L (ref 94–250)

## 2011-02-08 LAB — CBC WITH DIFFERENTIAL/PLATELET
Basophils Absolute: 0 10*3/uL (ref 0.0–0.1)
Eosinophils Absolute: 0.2 10*3/uL (ref 0.0–0.5)
LYMPH%: 42.6 % (ref 14.0–49.7)
MCV: 84.8 fL (ref 79.5–101.0)
MONO%: 10.7 % (ref 0.0–14.0)
NEUT#: 2.1 10*3/uL (ref 1.5–6.5)
NEUT%: 42.4 % (ref 38.4–76.8)
Platelets: 213 10*3/uL (ref 145–400)
RBC: 4.83 10*6/uL (ref 3.70–5.45)

## 2011-02-08 LAB — COMPREHENSIVE METABOLIC PANEL
ALT: 11 U/L (ref 0–35)
Alkaline Phosphatase: 56 U/L (ref 39–117)
CO2: 27 mEq/L (ref 19–32)
Sodium: 140 mEq/L (ref 135–145)
Total Bilirubin: 0.3 mg/dL (ref 0.3–1.2)
Total Protein: 7 g/dL (ref 6.0–8.3)

## 2011-02-08 NOTE — Telephone Encounter (Signed)
Gv pt appt for [id number].  scheduled pt for appt with Dr. Donell Beers for 03/03/2011 @ 8:30am

## 2011-02-08 NOTE — Progress Notes (Signed)
OFFICE PROGRESS NOTE  CC  Theresa Obey, MD [address] [place] [address]  Dr. Almond Lint  DIAGNOSIS: 50 year old female with history of ductal carcinoma in situ of the left breast status post lumpectomy in July 2009. The tumor was estrogen receptor positive and progesterone receptor positive.  PRIOR THERAPY:  #1 status post left lumpectomy followed by radiation therapy to all remaining left breast tissue from 11/04/2007 through 12/17/2007.  #2 patient has been on tamoxifen 20 mg on a daily basis since 03/26/2008.  CURRENT THERAPY: Tamoxifen 20 mg daily.  INTERVAL HISTORY: [name] 49 y.o. female returns for followup visit today. She was last seen in May 2012. Patient's main concern is left itching of the breast. He is at that is located around the nipple areola complex. She noticed is a rash as well in that area. He has had a mammogram and an ultrasound done in about 6 months ago which was negative. She then tells me the rash went away and has not reappeared. She has not noticed any underlying lumps or bumps or masses. She has not noticed any nipple discharge.  Otherwise patient has been doing well. She does have some form of eye problem going on and she is followed by an ophthalmologist. Her vision is not as good as it had been previously.  MEDICAL HISTORY: Past Medical History  Diagnosis Date  . Breast cancer   . Anxiety     ALLERGIES:  is allergic to ceclor; erythromycin; penicillins; and tetracycline.  MEDICATIONS:  Current Outpatient Prescriptions  Medication Sig Dispense Refill  . hydrochlorothiazide (HYDRODIURIL) 25 MG tablet Take 25 mg by mouth daily.        . potassium chloride (K-DUR,KLOR-CON) 10 MEQ tablet Take 10 mEq by mouth 2 (two) times daily.        . tamoxifen (NOLVADEX) 20 MG tablet Take 20 mg by mouth daily.          SURGICAL HISTORY: History reviewed. No pertinent past surgical history.  REVIEW OF SYSTEMS:   Constitutional: negative Eyes: positive for visual disturbance Ears, nose, mouth, throat, and face: negative Respiratory: negative Cardiovascular: negative Gastrointestinal: negative Genitourinary:negative Integument/breast: positive for pruritus and rash Musculoskeletal:negative Neurological: negative   PHYSICAL EXAMINATION: General appearance: alert, cooperative, appears stated age and no distress Head: Normocephalic, without obvious abnormality, atraumatic Neck: no adenopathy, no carotid bruit, no JVD, supple, symmetrical, trachea midline and thyroid not enlarged, symmetric, no tenderness/mass/nodules Lymph nodes: Cervical, supraclavicular, and axillary nodes normal. Resp: clear to auscultation bilaterally and normal percussion bilaterally Back: symmetric, no curvature. ROM normal. No CVA tenderness. Cardio: regular rate and rhythm, S1, S2 normal, no murmur, click, rub or gallop GI: soft, non-tender; bowel sounds normal; no masses,  no organomegaly Extremities: extremities normal, atraumatic, no cyanosis or edema Neurologic: Alert and oriented X 3, normal strength and tone. Normal symmetric reflexes. Normal coordination and gait Sensory: normal Motor: grossly normal Reflexes: normal 2+ and symmetric Bilateral breasts are examined. Right breast without any masses nipple discharge or skin changes. Left breast does reveal well-healed surgical scar it is smaller than the right breast. She is noted to have a little bit of area of what looks like a eczema, and right off of the nipple area LOC complex. There are no palpable masses underneath this area there is no nipple discharge retraction or other skin changes. There is no palpable skin nodularities.  ECOG PERFORMANCE STATUS: 0 - Asymptomatic  Blood pressure 136/87, pulse 92, temperature  98.2 F (36.8 C), temperature source Oral, height 5' 7.5" (1.715 m), weight 282 lb 1.6 oz (127.96 kg).  LABORATORY DATA: Lab Results  Component Value  Date   WBC 4.9 02/08/2011   HGB 13.9 02/08/2011   HCT 41.0 02/08/2011   MCV 84.8 02/08/2011   PLT 213 02/08/2011      Chemistry      Component Value Date/Time   NA 142 08/10/2010 1422   NA 139 11/18/2009 1321   K 3.9 08/10/2010 1422   K 3.9 11/18/2009 1321   CL 104 08/10/2010 1422   CL 97* 11/18/2009 1321   CO2 26 08/10/2010 1422   CO2 29 11/18/2009 1321   BUN 12 08/10/2010 1422   BUN 11 11/18/2009 1321   CREATININE 0.76 08/10/2010 1422   CREATININE 0.8 11/18/2009 1321      Component Value Date/Time   CALCIUM 9.4 08/10/2010 1422   CALCIUM 9.3 11/18/2009 1321   ALKPHOS 53 08/10/2010 1422   ALKPHOS 68 11/18/2009 1321   AST 14 08/10/2010 1422   AST 20 11/18/2009 1321   ALT 8 08/10/2010 1422   BILITOT 0.3 08/10/2010 1422   BILITOT 0.60 11/18/2009 1321       RADIOGRAPHIC STUDIES:  No results found.  ASSESSMENT: 50 year old female with history of DCIS of the left breast originally diagnosed in July 2009 she underwent a lumpectomy followed by radiation therapy and now she is on tamoxifen 20 mg daily since January 2010. Overall she is tolerating the tamoxifen well without any significant complaints. She denies any vaginal bleeding she has no swelling in her extremities. She however has been experiencing persistent pruritus of the left breast as well as skin changes what looks like a rash. She's had mammograms and ultrasounds performed but in spite of that she continues to have problems.   PLAN: Recommendation at this time is for patient to be seen by Dr. Everardo Beals for evaluation for possibly a biopsy of this region to rule out a local recurrence. She also will continue to receive and take her tamoxifen 20 mg daily.  I will set her up to see me back in 6 months time in the survivor clinic. However she knows to call sooner if need arises.   All questions were answered. The patient knows to call the clinic with any problems, questions or concerns. We can certainly see the patient much sooner if  necessary.  I spent 25 minutes counseling the patient face to face. The total time spent in the appointment was 40 minutes.    Drue Second, MD Medical/Oncology [facility] [phone] (beeper) [phone] (Office)  02/08/2011, 2:09 PM

## 2011-02-22 ENCOUNTER — Encounter: Payer: Self-pay | Admitting: Cardiology

## 2011-03-03 ENCOUNTER — Ambulatory Visit (INDEPENDENT_AMBULATORY_CARE_PROVIDER_SITE_OTHER): Payer: BC Managed Care – PPO | Admitting: General Surgery

## 2011-03-03 ENCOUNTER — Encounter (INDEPENDENT_AMBULATORY_CARE_PROVIDER_SITE_OTHER): Payer: Self-pay | Admitting: General Surgery

## 2011-03-03 ENCOUNTER — Encounter (INDEPENDENT_AMBULATORY_CARE_PROVIDER_SITE_OTHER): Payer: Self-pay

## 2011-03-03 VITALS — BP 136/90 | HR 86 | Temp 98.2°F | Resp 18 | Ht 69.0 in | Wt 279.4 lb

## 2011-03-03 DIAGNOSIS — D0590 Unspecified type of carcinoma in situ of unspecified breast: Secondary | ICD-10-CM

## 2011-03-03 DIAGNOSIS — C50919 Malignant neoplasm of unspecified site of unspecified female breast: Secondary | ICD-10-CM

## 2011-03-03 DIAGNOSIS — D0510 Intraductal carcinoma in situ of unspecified breast: Secondary | ICD-10-CM | POA: Insufficient documentation

## 2011-03-03 NOTE — Assessment & Plan Note (Signed)
Punch biopsy of L breast near nipple. Follow up in 2 weeks for suture removal.   If Bx negative, could try prednisone cream or antifungal cream.

## 2011-03-03 NOTE — Patient Instructions (Signed)
Keep wound dry for 24 hours.   Follow up for stitch removal.

## 2011-03-03 NOTE — Progress Notes (Signed)
Chief Complaint  Patient presents with  . 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  Her mammograms have been negative, but she developed an area of scaling near her left nipple.  She has been placing Nivea lotion on it, and it has started to look and feel better.  She states that the iching has been horrible.  The skin was also reddened before, but this has eased off.  She denies feeling any masses in her breast.  She is on her Tamoxifen, and has not had any blood clots.  She had significant hot flashes when she first started it, but these are better.    Past Medical History  Diagnosis Date  . Breast cancer   . Anxiety   . Hypertension   . Cough   . Visual disturbance   . Wheezing   . Palpitation   . Diarrhea   . Cramps, muscle, general     Past Surgical History  Procedure Date  . Abdominal hysterectomy 09/2005  . Breast surgery 09/2007    lumpectomy    Current Outpatient Prescriptions  Medication Sig Dispense Refill  . aspirin 81 MG tablet Take 81 mg by mouth daily.        . fish oil-omega-3 fatty acids 1000 MG capsule Take 2 g by mouth daily.        . hydrochlorothiazide (HYDRODIURIL) 25 MG tablet Take 25 mg by mouth daily.        Marland Kitchen LORazepam (ATIVAN) 0.5 MG tablet Take 0.5 mg by mouth every 8 (eight) hours.        . magnesium 30 MG tablet Take 100 mg by mouth once.        . potassium chloride (K-DUR,KLOR-CON) 10 MEQ tablet Take 10 mEq by mouth 2 (two) times daily.        . potassium chloride (KLOR-CON) 10 MEQ CR tablet Take 10 mEq by mouth daily.        . tamoxifen (NOLVADEX) 20 MG tablet Take 20 mg by mouth daily.        Marland Kitchen venlafaxine (EFFEXOR) 50 MG tablet Take 50 mg by mouth 2 (two) times daily.           Allergies  Allergen Reactions  . Ceclor (Cefaclor) Other (See Comments)    Causes pain in esophagus  . Erythromycin Hives and Itching  . Penicillins Hives and Itching  . Tetracycline Hives and Itching     Family  History  Problem Relation Age of Onset  . Diabetes Mother   . Stroke Mother   . Hypertension Mother   . Diabetes Father   . Hypertension Sister   . Hypertension Brother   . Cancer Paternal Aunt     ovarian  . Cancer Cousin     colon     History   Social History  . Marital Status: Married    Spouse Name: N/A    Number of Children: N/A  . Years of Education: N/A   Social History Main Topics  . Smoking status: Former Smoker    Quit date: 03/02/1990  . Smokeless tobacco: Never Used  . Alcohol Use: No  . Drug Use: No  . Sexually Active: Yes   Other Topics Concern  . None   Social History Narrative  . None     REVIEW OF SYSTEMS - PERTINENT POSITIVES ONLY: 12 point review of systems negative other than HPI and PMH  except for cough, wheezing, palpitations, diarrhea, pupil changes.    EXAMCeasar Mons Vitals:   03/03/11 0918  BP: 136/90  Pulse: 86  Temp: 98.2 F (36.8 C)  Resp: 18    Gen:  No acute distress.  Well nourished and well groomed.   Neurological: Alert and oriented to person, place, and time. Coordination normal.  Head: Normocephalic and atraumatic.  Eyes: Conjunctivae are normal. Pupils are equal, round, and reactive to light. No scleral icterus.  Neck: Normal range of motion. Neck supple. No tracheal deviation or thyromegaly present.  Cardiovascular: Normal rate, regular rhythm, normal heart sounds and intact distal pulses.  Exam reveals no gallop and no friction rub.  No murmur heard. Respiratory: Effort normal.  No respiratory distress. No chest wall tenderness. Breath sounds normal.  No wheezes, rales or rhonchi.  Breast exam:  No masses or lymphadenopathy bilaterally.  No nipple retraction or skin dimpling.  The left breast has an area of scaling at 1 o'clock around 1-2 cm.  This is anesthetized with 1% lidocaine and sodium bicarbonate.  A 3 mm punch biopsy is taken in the scaling part.   GI: Soft. Bowel sounds are normal. The abdomen is soft and  nontender.  There is no rebound and no guarding.  Musculoskeletal: Normal range of motion. Extremities are nontender.  Lymphadenopathy: No cervical, preauricular, postauricular or axillary adenopathy is present Skin: Skin is warm and dry. No rash noted. No diaphoresis. No erythema. No pallor. No clubbing, cyanosis, or edema.   Psychiatric: Normal mood and affect. Behavior is normal. Judgment and thought content normal.    RADIOLOGY RESULTS: Mammo and ultrasound from September and October are negative for concerning areas of malignancy.      ASSESSMENT AND PLAN: NEOPLASM, MALIGNANT, LEFT BREAST Punch biopsy of L breast near nipple. Follow up in 2 weeks for suture removal.   If Bx negative, could try prednisone cream or antifungal cream.       Maudry Diego MD Surgical Oncology, General and Endocrine Surgery [facility]   Visit Diagnoses: 1. Breast cancer   2. DCIS (ductal carcinoma in situ)   3. NEOPLASM, MALIGNANT, LEFT BREAST     Primary Care Physician: Milana Obey, MD

## 2011-03-08 ENCOUNTER — Telehealth (INDEPENDENT_AMBULATORY_CARE_PROVIDER_SITE_OTHER): Payer: Self-pay

## 2011-03-08 NOTE — Telephone Encounter (Signed)
Pt called in for pathology results which I gave her.  Will ask a physician here to write a Rx for antifungal/prednisone cream (per Dr. Arita Miss request), for her to start using until her f/u appt January 14th.

## 2011-03-09 ENCOUNTER — Telehealth (INDEPENDENT_AMBULATORY_CARE_PROVIDER_SITE_OTHER): Payer: Self-pay

## 2011-03-09 NOTE — Telephone Encounter (Signed)
Pt returning call.  Told her per Dr. Biagio Quint that she could use Hydrocortisone cream .1% OTC on her breast.  Will re-evaluate Rx when she comes in to see Dr. Donell Beers in January.

## 2011-03-16 ENCOUNTER — Other Ambulatory Visit: Payer: Self-pay | Admitting: Oncology

## 2011-03-16 DIAGNOSIS — C50919 Malignant neoplasm of unspecified site of unspecified female breast: Secondary | ICD-10-CM

## 2011-03-27 ENCOUNTER — Encounter (INDEPENDENT_AMBULATORY_CARE_PROVIDER_SITE_OTHER): Payer: BC Managed Care – PPO | Admitting: General Surgery

## 2011-06-14 ENCOUNTER — Telehealth: Payer: Self-pay | Admitting: Oncology

## 2011-06-14 NOTE — Telephone Encounter (Signed)
lmonvm adviisng the pt of her r/s appt time on 08/10/2011 from 3:15pm to 2:30pm

## 2011-07-10 ENCOUNTER — Other Ambulatory Visit: Payer: Self-pay | Admitting: Oncology

## 2011-07-10 DIAGNOSIS — Z9889 Other specified postprocedural states: Secondary | ICD-10-CM

## 2011-08-08 ENCOUNTER — Telehealth: Payer: Self-pay | Admitting: *Deleted

## 2011-08-08 NOTE — Telephone Encounter (Signed)
Pt called to r/s appt  thurs 5/30 with [facility]. Transferred call to scheduling per pt request.

## 2011-08-10 ENCOUNTER — Ambulatory Visit: Payer: BC Managed Care – PPO | Admitting: Family

## 2011-08-10 ENCOUNTER — Ambulatory Visit (HOSPITAL_BASED_OUTPATIENT_CLINIC_OR_DEPARTMENT_OTHER): Payer: BC Managed Care – PPO | Admitting: Family

## 2011-08-10 ENCOUNTER — Ambulatory Visit: Payer: BC Managed Care – PPO

## 2011-08-10 ENCOUNTER — Telehealth: Payer: Self-pay | Admitting: *Deleted

## 2011-08-10 VITALS — BP 138/88 | HR 92 | Temp 98.5°F | Ht 69.0 in | Wt 285.4 lb

## 2011-08-10 DIAGNOSIS — F3289 Other specified depressive episodes: Secondary | ICD-10-CM

## 2011-08-10 DIAGNOSIS — D0590 Unspecified type of carcinoma in situ of unspecified breast: Secondary | ICD-10-CM

## 2011-08-10 DIAGNOSIS — F329 Major depressive disorder, single episode, unspecified: Secondary | ICD-10-CM

## 2011-08-10 DIAGNOSIS — C50919 Malignant neoplasm of unspecified site of unspecified female breast: Secondary | ICD-10-CM

## 2011-08-10 LAB — COMPREHENSIVE METABOLIC PANEL
Albumin: 3.6 g/dL (ref 3.5–5.2)
BUN: 11 mg/dL (ref 6–23)
Calcium: 9.1 mg/dL (ref 8.4–10.5)
Chloride: 100 mEq/L (ref 96–112)
Creatinine, Ser: 0.82 mg/dL (ref 0.50–1.10)
Glucose, Bld: 91 mg/dL (ref 70–99)
Potassium: 3.5 mEq/L (ref 3.5–5.3)

## 2011-08-10 MED ORDER — CITALOPRAM HYDROBROMIDE 20 MG PO TABS: 40.0000 mg | ORAL_TABLET | Freq: Every day | ORAL | Status: DC

## 2011-08-10 NOTE — Telephone Encounter (Signed)
gave patient appointment for 02-12-2012 starting at 3:00pm with labs and md  printed out calendar and gave to the patient sent patient back to the lab on 08-10-2011

## 2011-08-10 NOTE — Patient Instructions (Signed)
Take 1/2 dose of Effexor for 7 days, then begin Celexa daily.

## 2011-08-11 ENCOUNTER — Ambulatory Visit: Payer: BC Managed Care – PPO | Admitting: Family

## 2011-08-11 ENCOUNTER — Encounter: Payer: Self-pay | Admitting: Family

## 2011-08-11 NOTE — Progress Notes (Signed)
[facility]  Name: [name]                  DATE: 08/11/2011 MRN: [id number]                      DOB: 12-Feb-1961  REFERRING PHYSICIAN: Milana Obey, MD  DIAGNOSIS: Patient Active Problem List  Diagnoses Date Noted  . 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 NEOPLASM, MALIGNANT, LEFT BREAST 12/13/2009  . ANXIETY 12/13/2009  . CHEST PAIN 12/13/2009     Encounter Diagnosis  Name Primary?  . Breast cancer Yes  Left DCIS, July 2009  PREVIOUS THERAPY:  1. Left lumpectomy and radiation therapy from 8/09 - 10/09.  CURRENT THERAPY: Tamoxifen since 03/26/2008.   INTERIM HISTORY:  Overall, doing well. Chief complaint is leg cramps at night. Also experiencing depression, has been on Effexor for years. She wonders if it is no longer effective. No self-detected breast complaints.   PHYSICAL EXAM: BP 138/88  Pulse 92  Temp(Src) 98.5 F (36.9 C) (Oral)  Ht 5\' 9"  (1.753 m)  Wt 285 lb 6.4 oz (129.457 kg)  BMI 42.15 kg/m2 General: Well developed, well nourished, in no acute distress.  EENT: No ocular or oral lesions. No stomatitis.  Respiratory: Lungs are clear to auscultation bilaterally with normal respiratory movement and no accessory muscle use. Cardiac: No murmur, rub or tachycardia. No upper or lower extremity edema.  GI: Abdomen is soft, no palpable hepatosplenomegaly. No fluid wave. No tenderness. Musculoskeletal: No kyphosis, no tenderness over the spine, ribs or hips. Lymph: No cervical, infraclavicular, axillary or inguinal adenopathy. Neuro: No focal neurological deficits. Psych: Alert and oriented X 3, appropriate mood and affect.  BREAST EXAM: In the supine position, with the right arm over the head, the right nipple is everted. No periareolar edema or nipple discharge. No mass in any quadrant or subareolar region. No redness of the skin. No right axillary adenopathy. With the left arm over the head, the left nipple is everted. No periareolar  edema or nipple discharge. No mass in any quadrant or subareolar region. No redness of the skin. No left axillary adenopathy. Breasts are large in size and diameter. Glandular and difficult to examine.  LABORATORY STUDIES:   Results for orders placed in visit on 02/08/11  COMPREHENSIVE METABOLIC PANEL      Component Value Range   Sodium 140  135 - 145 (mEq/L)   Potassium 3.9  3.5 - 5.3 (mEq/L)   Chloride 103  96 - 112 (mEq/L)   CO2 27  19 - 32 (mEq/L)   Glucose, Bld 91  70 - 99 (mg/dL)   BUN 17  6 - 23 (mg/dL)   Creatinine, Ser 8.29  0.50 - 1.10 (mg/dL)   Total Bilirubin 0.3  0.3 - 1.2 (mg/dL)   Alkaline Phosphatase 56  39 - 117 (U/L)   AST 17  0 - 37 (U/L)   ALT 11  0 - 35 (U/L)   Total Protein 7.0  6.0 - 8.3 (g/dL)   Albumin 4.1  3.5 - 5.2 (g/dL)   Calcium 9.3  8.4 - 56.2 (mg/dL)  LACTATE DEHYDROGENASE      Component Value Range   LDH 162  94 - 250 (U/L)   SELF-ASSESSMENT CONCERNS:  Physical: Weight gain, Pain, (elbows to fingers), takes herbs, legs cramps.  Practical: Trouble at work Relationship: Trouble with relationship with partner Emotional: Depression  IMPRESSION:  51 y/o female with: 1.  History of left DCIS, no evidence of recurrence.  2. Depression, while on Effexor 3. Leg cramps   PLAN:   1. Check CMET 2. Discontinue Effexor, begin Celexa.  3. Return in 6 months to see Dr. Welton Flakes.  4. Vit D level pending.   DISCUSSION:

## 2011-08-16 ENCOUNTER — Ambulatory Visit (HOSPITAL_COMMUNITY)
Admission: RE | Admit: 2011-08-16 | Discharge: 2011-08-16 | Disposition: A | Discharge status: Home / Self Care | Payer: BC Managed Care – PPO | Source: Ambulatory Visit | Attending: Oncology | Admitting: Oncology

## 2011-08-16 DIAGNOSIS — Z853 Personal history of malignant neoplasm of breast: Secondary | ICD-10-CM | POA: Insufficient documentation

## 2011-08-16 DIAGNOSIS — Z9889 Other specified postprocedural states: Secondary | ICD-10-CM

## 2012-02-13 ENCOUNTER — Telehealth: Payer: Self-pay | Admitting: *Deleted

## 2012-02-13 NOTE — Telephone Encounter (Signed)
per pt request. Please r/s 02/14/12 lab/md appt. Please call pt with new date/time.  Left voice message to inform the patient of the new date and time on 02-21-2012 starting at 9:30am

## 2012-02-13 NOTE — Telephone Encounter (Signed)
Pt called LMOVM states " I'm not going to make it to my appt tomorrow 02/14/12 lab/MD.I need to reschedule." Returned pt's call lmovm message to r/s 12/4 appt received and to expect a call from scheduling with a new appt date/time. Pt call back with any concerns. Onc Tx Sent

## 2012-02-14 ENCOUNTER — Other Ambulatory Visit: Payer: BC Managed Care – PPO | Admitting: Lab

## 2012-02-14 ENCOUNTER — Ambulatory Visit: Payer: BC Managed Care – PPO | Admitting: Oncology

## 2012-02-21 ENCOUNTER — Other Ambulatory Visit (HOSPITAL_BASED_OUTPATIENT_CLINIC_OR_DEPARTMENT_OTHER): Payer: BC Managed Care – PPO | Admitting: Lab

## 2012-02-21 ENCOUNTER — Telehealth: Payer: Self-pay | Admitting: Oncology

## 2012-02-21 ENCOUNTER — Ambulatory Visit (HOSPITAL_BASED_OUTPATIENT_CLINIC_OR_DEPARTMENT_OTHER): Payer: BC Managed Care – PPO | Admitting: Oncology

## 2012-02-21 ENCOUNTER — Encounter: Payer: Self-pay | Admitting: Oncology

## 2012-02-21 VITALS — BP 147/97 | HR 111 | Temp 98.5°F | Resp 20 | Ht 69.0 in | Wt 292.4 lb

## 2012-02-21 DIAGNOSIS — D0510 Intraductal carcinoma in situ of unspecified breast: Secondary | ICD-10-CM

## 2012-02-21 DIAGNOSIS — C50919 Malignant neoplasm of unspecified site of unspecified female breast: Secondary | ICD-10-CM

## 2012-02-21 DIAGNOSIS — D0590 Unspecified type of carcinoma in situ of unspecified breast: Secondary | ICD-10-CM

## 2012-02-21 DIAGNOSIS — Z170 Estrogen receptor positive status [ER+]: Secondary | ICD-10-CM

## 2012-02-21 NOTE — Progress Notes (Signed)
OFFICE PROGRESS NOTE  CC  Milana Obey, MD [address] [place] [address]  Dr. Almond Lint  DIAGNOSIS: 51 year old female with history of ductal carcinoma in situ of the left breast status post lumpectomy in July 2009. The tumor was estrogen receptor positive and progesterone receptor positive.  PRIOR THERAPY:  #1 status post left lumpectomy followed by radiation therapy to all remaining left breast tissue from 11/04/2007 through 12/17/2007.  #2 patient has been on tamoxifen 20 mg on a daily basis since 03/26/2008.  CURRENT THERAPY: Tamoxifen 20 mg daily.  INTERVAL HISTORY: [name] 51 y.o. female returns for followup visit today. Clinically she seems to be doing well she is tolerating the tamoxifen well. She has minimal side effects from it only hot flashes no vaginal bleeding or discharge. She has had a hysterectomy but has not been seen by her gynecologist for about 2-3 years and I have recommended that she be seen by them soon as she does need a cervical exams should she does have her cervix. Remainder of the 10 point review of systems is negative.   MEDICAL HISTORY: Past Medical History  Diagnosis Date  . Breast cancer   . Anxiety   . Hypertension   . Cough   . Visual disturbance   . Wheezing   . Palpitation   . Diarrhea   . Cramps, muscle, general     ALLERGIES:  is allergic to ceclor; erythromycin; penicillins; and tetracycline.  MEDICATIONS:  Current Outpatient Prescriptions  Medication Sig Dispense Refill  . aspirin 81 MG tablet Take 81 mg by mouth daily.        . B COMPLEX-BIOTIN-FA ER PO Take 1 tablet by mouth daily. Takes Complete B-Compled/M.S.M/ Silicon (healthy hair skin and nails)      . Cholecalciferol (VITAMIN D-3) 1000 UNITS CAPS Take 1 Units by mouth daily.      . Chromium-Cinnamon (CINNAMON PLUS CHROMIUM) [phone] MCG-MG CAPS Take 1 tablet by mouth daily.      . hydrochlorothiazide (HYDRODIURIL) 25 MG tablet  Take 25 mg by mouth daily.        Marland Kitchen LORazepam (ATIVAN) 0.5 MG tablet Take 0.5 mg by mouth every 8 (eight) hours.        . Multiple Vitamin (MULTIVITAMIN) capsule Take 1 capsule by mouth daily.      . potassium chloride (K-DUR,KLOR-CON) 10 MEQ tablet Take 10 mEq by mouth 2 (two) times daily.        . tamoxifen (NOLVADEX) 20 MG tablet TAKE ONE TABLET BY MOUTH EVERY DAY  30 tablet  12  . venlafaxine (EFFEXOR) 50 MG tablet Take 50 mg by mouth 2 (two) times daily.        . citalopram (CELEXA) 20 MG tablet Take 2 tablets (40 mg total) by mouth daily. Take one pill by mouth daily for 7 days, then increase to 2 pills daily.  60 tablet  3  . fish oil-omega-3 fatty acids 1000 MG capsule Take 2 g by mouth daily.        . magnesium 30 MG tablet Take 100 mg by mouth once.        . potassium chloride (KLOR-CON) 10 MEQ CR tablet Take 10 mEq by mouth daily.          SURGICAL HISTORY:  Past Surgical History  Procedure Date  . Abdominal hysterectomy 09/2005  . Breast surgery 09/2007    lumpectomy    REVIEW OF SYSTEMS:  Constitutional: negative  Eyes: positive for visual disturbance Ears, nose, mouth, throat, and face: negative Respiratory: negative Cardiovascular: negative Gastrointestinal: negative Genitourinary:negative Integument/breast: positive for pruritus and rash Musculoskeletal:negative Neurological: negative   PHYSICAL EXAMINATION: General appearance: alert, cooperative, appears stated age and no distress Head: Normocephalic, without obvious abnormality, atraumatic Neck: no adenopathy, no carotid bruit, no JVD, supple, symmetrical, trachea midline and thyroid not enlarged, symmetric, no tenderness/mass/nodules Lymph nodes: Cervical, supraclavicular, and axillary nodes normal. Resp: clear to auscultation bilaterally and normal percussion bilaterally Back: symmetric, no curvature. ROM normal. No CVA tenderness. Cardio: regular rate and rhythm, S1, S2 normal, no murmur, click, rub or  gallop GI: soft, non-tender; bowel sounds normal; no masses,  no organomegaly Extremities: extremities normal, atraumatic, no cyanosis or edema Neurologic: Alert and oriented X 3, normal strength and tone. Normal symmetric reflexes. Normal coordination and gait Sensory: normal Motor: grossly normal Reflexes: normal 2+ and symmetric Bilateral breasts are examined. Right breast without any masses nipple discharge or skin changes. Left breast does reveal well-healed surgical scar it is smaller than the right breast. She is noted to have a little bit of area of what looks like a eczema, and right off of the nipple area LOC complex. There are no palpable masses underneath this area there is no nipple discharge retraction or other skin changes. There is no palpable skin nodularities.  ECOG PERFORMANCE STATUS: 0 - Asymptomatic  Blood pressure 147/97, pulse 111, temperature 98.5 F (36.9 C), resp. rate 20, height 5\' 9"  (1.753 m), weight 292 lb 6.4 oz (132.632 kg).  LABORATORY DATA: Lab Results  Component Value Date   WBC 4.9 02/08/2011   HGB 13.9 02/08/2011   HCT 41.0 02/08/2011   MCV 84.8 02/08/2011   PLT 213 02/08/2011      Chemistry      Component Value Date/Time   NA 142 08/10/2011 1608   NA 139 11/18/2009 1321   K 3.5 08/10/2011 1608   K 3.9 11/18/2009 1321   CL 100 08/10/2011 1608   CL 97* 11/18/2009 1321   CO2 32 08/10/2011 1608   CO2 29 11/18/2009 1321   BUN 11 08/10/2011 1608   BUN 11 11/18/2009 1321   CREATININE 0.82 08/10/2011 1608   CREATININE 0.8 11/18/2009 1321      Component Value Date/Time   CALCIUM 9.1 08/10/2011 1608   CALCIUM 9.3 11/18/2009 1321   ALKPHOS 58 08/10/2011 1608   ALKPHOS 68 11/18/2009 1321   AST 14 08/10/2011 1608   AST 20 11/18/2009 1321   ALT 12 08/10/2011 1608   BILITOT 0.3 08/10/2011 1608   BILITOT 0.60 11/18/2009 1321       RADIOGRAPHIC STUDIES:  No results found.  ASSESSMENT: 51 year old female with history of DCIS of the left breast originally diagnosed in  July 2009 she underwent a lumpectomy followed by radiation therapy and now she is on tamoxifen 20 mg daily since January 2010. Overall she is tolerating the tamoxifen well without any significant complaints.    PLAN:   #1 patient will continue tamoxifen 20 mg on a daily basis.  #2 she will return in one years time for followup visit.  All questions were answered. The patient knows to call the clinic with any problems, questions or concerns. We can certainly see the patient much sooner if necessary.  I spent 25 minutes counseling the patient face to face. The total time spent in the appointment was 40 minutes.    Drue Second, MD Medical/Oncology [facility] [phone] (beeper) [phone] (Office)  02/21/2012, 11:58 AM

## 2012-02-21 NOTE — Patient Instructions (Addendum)
Continue to take tamoxifen daily.  I will see you back in 1 year for follow up

## 2012-02-21 NOTE — Telephone Encounter (Signed)
gve the pt her dec 2014 appt calendar °

## 2012-02-22 ENCOUNTER — Telehealth: Payer: Self-pay | Admitting: Medical Oncology

## 2012-02-22 ENCOUNTER — Encounter: Payer: Self-pay | Admitting: Oncology

## 2012-02-22 NOTE — Telephone Encounter (Signed)
Patient LVMOM asking for the lab results drawn yesterday. Will review with MD. Last app with lab/MD 02/21/12.

## 2012-02-22 NOTE — Telephone Encounter (Signed)
Attempted to call patient regarding her lab results. No voicemail option available.

## 2012-02-22 NOTE — Telephone Encounter (Signed)
vitamin D level is excellent

## 2012-02-23 ENCOUNTER — Telehealth: Payer: Self-pay | Admitting: Medical Oncology

## 2012-02-23 NOTE — Telephone Encounter (Signed)
Error

## 2012-02-23 NOTE — Telephone Encounter (Signed)
Attempted to reach patient again regarding Vit D lab results. Called pt's work number left message for patient to call back at her convience.

## 2012-04-14 ENCOUNTER — Other Ambulatory Visit: Payer: Self-pay | Admitting: Oncology

## 2012-04-27 ENCOUNTER — Other Ambulatory Visit: Payer: Self-pay

## 2012-06-21 ENCOUNTER — Other Ambulatory Visit: Payer: Self-pay | Admitting: Oncology

## 2012-06-21 ENCOUNTER — Other Ambulatory Visit (HOSPITAL_COMMUNITY): Payer: Self-pay | Admitting: *Deleted

## 2012-06-21 DIAGNOSIS — Z9889 Other specified postprocedural states: Secondary | ICD-10-CM

## 2012-08-12 ENCOUNTER — Encounter: Payer: Self-pay | Admitting: Oncology

## 2012-08-12 NOTE — Progress Notes (Signed)
Tried calling the patient to advise her that an EPP was sent to her. Per recording I could not leave a message, because vmail had not been set up.

## 2012-08-21 ENCOUNTER — Ambulatory Visit (HOSPITAL_COMMUNITY)
Admission: RE | Admit: 2012-08-21 | Discharge: 2012-08-21 | Disposition: A | Discharge status: Home / Self Care | Payer: BC Managed Care – PPO | Source: Ambulatory Visit | Attending: Oncology | Admitting: Oncology

## 2012-08-21 DIAGNOSIS — Z9889 Other specified postprocedural states: Secondary | ICD-10-CM

## 2012-08-21 DIAGNOSIS — Z853 Personal history of malignant neoplasm of breast: Secondary | ICD-10-CM | POA: Insufficient documentation

## 2012-12-25 ENCOUNTER — Emergency Department (HOSPITAL_COMMUNITY)
Admission: EM | Admit: 2012-12-25 | Discharge: 2012-12-26 | Disposition: A | Discharge status: Home / Self Care | Payer: BC Managed Care – PPO | Attending: Emergency Medicine | Admitting: Emergency Medicine

## 2012-12-25 ENCOUNTER — Encounter (HOSPITAL_COMMUNITY): Payer: Self-pay | Admitting: Emergency Medicine

## 2012-12-25 ENCOUNTER — Emergency Department (HOSPITAL_COMMUNITY): Payer: BC Managed Care – PPO

## 2012-12-25 DIAGNOSIS — Z79899 Other long term (current) drug therapy: Secondary | ICD-10-CM | POA: Insufficient documentation

## 2012-12-25 DIAGNOSIS — Z880 Allergy status to penicillin: Secondary | ICD-10-CM | POA: Insufficient documentation

## 2012-12-25 DIAGNOSIS — Z8739 Personal history of other diseases of the musculoskeletal system and connective tissue: Secondary | ICD-10-CM | POA: Insufficient documentation

## 2012-12-25 DIAGNOSIS — R000 Tachycardia, unspecified: Secondary | ICD-10-CM | POA: Insufficient documentation

## 2012-12-25 DIAGNOSIS — I10 Essential (primary) hypertension: Secondary | ICD-10-CM | POA: Insufficient documentation

## 2012-12-25 DIAGNOSIS — R509 Fever, unspecified: Secondary | ICD-10-CM | POA: Insufficient documentation

## 2012-12-25 DIAGNOSIS — F411 Generalized anxiety disorder: Secondary | ICD-10-CM | POA: Insufficient documentation

## 2012-12-25 DIAGNOSIS — R059 Cough, unspecified: Secondary | ICD-10-CM | POA: Insufficient documentation

## 2012-12-25 DIAGNOSIS — R05 Cough: Secondary | ICD-10-CM | POA: Insufficient documentation

## 2012-12-25 DIAGNOSIS — Z8669 Personal history of other diseases of the nervous system and sense organs: Secondary | ICD-10-CM | POA: Insufficient documentation

## 2012-12-25 DIAGNOSIS — Z853 Personal history of malignant neoplasm of breast: Secondary | ICD-10-CM | POA: Insufficient documentation

## 2012-12-25 DIAGNOSIS — Z7982 Long term (current) use of aspirin: Secondary | ICD-10-CM | POA: Insufficient documentation

## 2012-12-25 DIAGNOSIS — Z87891 Personal history of nicotine dependence: Secondary | ICD-10-CM | POA: Insufficient documentation

## 2012-12-25 DIAGNOSIS — R062 Wheezing: Secondary | ICD-10-CM | POA: Insufficient documentation

## 2012-12-25 MED ORDER — SODIUM CHLORIDE 0.9 % IV SOLN
INTRAVENOUS | Status: DC
  Administered 2012-12-26: 01:00:00 via INTRAVENOUS

## 2012-12-26 LAB — BASIC METABOLIC PANEL
BUN: 11 mg/dL (ref 6–23)
CO2: 26 mEq/L (ref 19–32)
Chloride: 99 mEq/L (ref 96–112)
GFR calc non Af Amer: >90 mL/min (ref >90)
Glucose, Bld: 143 mg/dL — ABNORMAL HIGH (ref 70–99)
Potassium: 3.6 mEq/L (ref 3.5–5.1)
Sodium: 133 mEq/L — ABNORMAL LOW (ref 135–145)

## 2012-12-26 LAB — CBC WITH DIFFERENTIAL/PLATELET
Basophils Absolute: 0 10*3/uL (ref 0.0–0.1)
Eosinophils Absolute: 0.1 10*3/uL (ref 0.0–0.7)
HCT: 38 % (ref 36.0–46.0)
Lymphocytes Relative: 35 % (ref 12–46)
Lymphs Abs: 1.7 10*3/uL (ref 0.7–4.0)
MCH: 28.3 pg (ref 26.0–34.0)
Neutro Abs: 2.4 10*3/uL (ref 1.7–7.7)
Neutrophils Relative %: 49 % (ref 43–77)
Platelets: 191 10*3/uL (ref 150–400)
RBC: 4.56 MIL/uL (ref 3.87–5.11)
WBC: 4.8 10*3/uL (ref 4.0–10.5)

## 2012-12-26 LAB — CG4 I-STAT (LACTIC ACID): Lactic Acid, Venous: 1.43 mmol/L (ref 0.5–2.2)

## 2012-12-26 LAB — URINE MICROSCOPIC-ADD ON

## 2012-12-26 LAB — URINALYSIS, ROUTINE W REFLEX MICROSCOPIC
Glucose, UA: NEGATIVE mg/dL
Leukocytes, UA: NEGATIVE
Specific Gravity, Urine: 1.023 (ref 1.005–1.030)
Urobilinogen, UA: 0.2 mg/dL (ref 0.0–1.0)

## 2012-12-26 MED ORDER — ACETAMINOPHEN 325 MG PO TABS
650.0000 mg | ORAL_TABLET | Freq: Once | ORAL | Status: AC
  Administered 2012-12-26: 650 mg via ORAL
  Filled 2012-12-26: qty 2

## 2012-12-26 MED ORDER — LEVOFLOXACIN 500 MG PO TABS
750.0000 mg | ORAL_TABLET | Freq: Every day | ORAL | Status: DC
  Administered 2012-12-26: 750 mg via ORAL
  Filled 2012-12-26: qty 2

## 2012-12-26 MED ORDER — LEVOFLOXACIN 750 MG PO TABS: 750.0000 mg | ORAL_TABLET | Freq: Every day | ORAL | Status: DC

## 2012-12-26 NOTE — ED Provider Notes (Signed)
CSN: [id number]     Arrival date & time 12/25/12  2330 History   First MD Initiated Contact with Patient 12/25/12 2358     Chief Complaint  Patient presents with  . Fever  . Cough   (Consider location/radiation/quality/duration/timing/severity/associated sxs/prior Treatment) Patient is a 52 y.o. female presenting with fever and cough. The history is provided by the patient and a relative.  Fever Associated symptoms: cough   Cough Associated symptoms: fever    patient complains of two-day history of fever and cough. No vomiting or diarrhea. Notes possible dysuria without vaginal bleeding or discharge. Denies abdominal pain. Denies any chest pain or chest pressure. Symptoms have been persistent and she is not using medications prior to arrival. Cough has been productive of green sputum and associated with the fever. Denies any rashes to her body.  Past Medical History  Diagnosis Date  . Breast cancer   . Anxiety   . Hypertension   . Cough   . Visual disturbance   . Wheezing   . Palpitation   . Diarrhea   . Cramps, muscle, general    Past Surgical History  Procedure Laterality Date  . Abdominal hysterectomy  09/2005  . Breast surgery  09/2007    lumpectomy   Family History  Problem Relation Age of Onset  . Diabetes Mother   . Stroke Mother   . Hypertension Mother   . Diabetes Father   . Hypertension Sister   . Hypertension Brother   . Cancer Paternal Aunt     ovarian  . Cancer Cousin     colon   History  Substance Use Topics  . Smoking status: Former Smoker    Quit date: 03/02/1990  . Smokeless tobacco: Never Used  . Alcohol Use: No   OB History   Grav Para Term Preterm Abortions TAB SAB Ect Mult Living                 Review of Systems  Constitutional: Positive for fever.  Respiratory: Positive for cough.   All other systems reviewed and are negative.    Allergies  Ceclor; Erythromycin; Penicillins; and Tetracycline  Home Medications   Current  Outpatient Rx  Name  Route  Sig  Dispense  Refill  . aspirin 81 MG tablet   Oral   Take 81 mg by mouth daily.           . B COMPLEX-BIOTIN-FA ER PO   Oral   Take 1 tablet by mouth daily. Takes Complete B-Compled/M.S.M/ Silicon (healthy hair skin and nails)         . Cholecalciferol (VITAMIN D-3) 1000 UNITS CAPS   Oral   Take 1 Units by mouth daily.         . Chromium-Cinnamon (CINNAMON PLUS CHROMIUM) [phone] MCG-MG CAPS   Oral   Take 1 tablet by mouth daily.         Marland Kitchen EXPIRED: citalopram (CELEXA) 20 MG tablet   Oral   Take 2 tablets (40 mg total) by mouth daily. Take one pill by mouth daily for 7 days, then increase to 2 pills daily.   60 tablet   3   . fish oil-omega-3 fatty acids 1000 MG capsule   Oral   Take 2 g by mouth daily.           . hydrochlorothiazide (HYDRODIURIL) 25 MG tablet   Oral   Take 25 mg by mouth daily.           Marland Kitchen  LORazepam (ATIVAN) 0.5 MG tablet   Oral   Take 0.5 mg by mouth every 8 (eight) hours.           . magnesium 30 MG tablet   Oral   Take 100 mg by mouth once.           . Multiple Vitamin (MULTIVITAMIN) capsule   Oral   Take 1 capsule by mouth daily.         . potassium chloride (K-DUR,KLOR-CON) 10 MEQ tablet   Oral   Take 10 mEq by mouth 2 (two) times daily.           . potassium chloride (KLOR-CON) 10 MEQ CR tablet   Oral   Take 10 mEq by mouth daily.           . tamoxifen (NOLVADEX) 20 MG tablet      TAKE ONE TABLET BY MOUTH EVERY DAY   30 tablet   6   . venlafaxine (EFFEXOR) 50 MG tablet   Oral   Take 50 mg by mouth 2 (two) times daily.            BP 132/77  Pulse 105  Temp(Src) 101.2 F (38.4 C) (Oral)  Resp 20  Ht 5\' 9"  (1.753 m)  Wt 250 lb (113.399 kg)  BMI 36.9 kg/m2  SpO2 97% Physical Exam  Nursing note and vitals reviewed. Constitutional: She is oriented to person, place, and time. She appears well-developed and well-nourished.  Non-toxic appearance. No distress.  HENT:  Head:  Normocephalic and atraumatic.  Eyes: Conjunctivae, EOM and lids are normal. Pupils are equal, round, and reactive to light.  Neck: Normal range of motion. Neck supple. No tracheal deviation present. No mass present.  Cardiovascular: Regular rhythm and normal heart sounds.  Tachycardia present.  Exam reveals no gallop.   No murmur heard. Pulmonary/Chest: Effort normal. No stridor. No respiratory distress. She has decreased breath sounds. She has wheezes. She has no rhonchi. She has no rales.  Abdominal: Soft. Normal appearance and bowel sounds are normal. She exhibits no distension. There is no tenderness. There is no rebound and no CVA tenderness.  Musculoskeletal: Normal range of motion. She exhibits no edema and no tenderness.  Neurological: She is alert and oriented to person, place, and time. She has normal strength. No cranial nerve deficit or sensory deficit. GCS eye subscore is 4. GCS verbal subscore is 5. GCS motor subscore is 6.  Skin: Skin is warm and dry. No abrasion and no rash noted.  Psychiatric: She has a normal mood and affect. Her speech is normal and behavior is normal.    ED Course  Procedures (including critical care time) Labs Review Labs Reviewed  CBC WITH DIFFERENTIAL - Abnormal; Notable for the following:    Monocytes Relative 13 (*)    All other components within normal limits  CULTURE, BLOOD (ROUTINE X 2)  CULTURE, BLOOD (ROUTINE X 2)  BASIC METABOLIC PANEL  URINALYSIS, ROUTINE W REFLEX MICROSCOPIC  CG4 I-STAT (LACTIC ACID)   Imaging Review No results found.  EKG Interpretation   None       MDM  No diagnosis found. Patient to be treated empirically for pneumonia. Given Levaquin here by mouth and will be placed on same    Toy Baker, MD 12/26/12 0403

## 2013-01-01 LAB — CULTURE, BLOOD (ROUTINE X 2)
Culture: NO GROWTH
Culture: NO GROWTH

## 2013-01-16 ENCOUNTER — Other Ambulatory Visit: Payer: Self-pay

## 2013-02-21 ENCOUNTER — Other Ambulatory Visit (HOSPITAL_BASED_OUTPATIENT_CLINIC_OR_DEPARTMENT_OTHER): Payer: BC Managed Care – PPO

## 2013-02-21 ENCOUNTER — Ambulatory Visit (HOSPITAL_BASED_OUTPATIENT_CLINIC_OR_DEPARTMENT_OTHER): Payer: BC Managed Care – PPO | Admitting: Oncology

## 2013-02-21 ENCOUNTER — Telehealth: Payer: Self-pay | Admitting: Oncology

## 2013-02-21 ENCOUNTER — Encounter: Payer: Self-pay | Admitting: Oncology

## 2013-02-21 VITALS — BP 113/83 | HR 112 | Temp 98.8°F | Resp 19 | Ht 69.0 in | Wt 264.5 lb

## 2013-02-21 DIAGNOSIS — D0590 Unspecified type of carcinoma in situ of unspecified breast: Secondary | ICD-10-CM

## 2013-02-21 DIAGNOSIS — Z170 Estrogen receptor positive status [ER+]: Secondary | ICD-10-CM

## 2013-02-21 DIAGNOSIS — C50919 Malignant neoplasm of unspecified site of unspecified female breast: Secondary | ICD-10-CM

## 2013-02-21 DIAGNOSIS — D0510 Intraductal carcinoma in situ of unspecified breast: Secondary | ICD-10-CM

## 2013-02-21 DIAGNOSIS — D0512 Intraductal carcinoma in situ of left breast: Secondary | ICD-10-CM

## 2013-02-21 LAB — COMPREHENSIVE METABOLIC PANEL (CC13)
ALT: 10 U/L (ref 0–55)
AST: 16 U/L (ref 5–34)
Albumin: 3.7 g/dL (ref 3.5–5.0)
Alkaline Phosphatase: 68 U/L (ref 40–150)
Anion Gap: 14 mEq/L — ABNORMAL HIGH (ref 3–11)
CO2: 26 mEq/L (ref 22–29)
Chloride: 106 mEq/L (ref 98–109)
Creatinine: 0.8 mg/dL (ref 0.6–1.1)
Potassium: 3.3 mEq/L — ABNORMAL LOW (ref 3.5–5.1)
Sodium: 145 mEq/L (ref 136–145)
Total Bilirubin: 0.26 mg/dL (ref 0.20–1.20)
Total Protein: 7.7 g/dL (ref 6.4–8.3)

## 2013-02-21 LAB — CBC WITH DIFFERENTIAL/PLATELET
BASO%: 1.5 % (ref 0.0–2.0)
EOS%: 3.5 % (ref 0.0–7.0)
Eosinophils Absolute: 0.2 10*3/uL (ref 0.0–0.5)
HCT: 39.7 % (ref 34.8–46.6)
HGB: 13.3 g/dL (ref 11.6–15.9)
LYMPH%: 32.9 % (ref 14.0–49.7)
MCH: 28.4 pg (ref 25.1–34.0)
MCHC: 33.6 g/dL (ref 31.5–36.0)
MCV: 84.7 fL (ref 79.5–101.0)
MONO#: 0.6 10*3/uL (ref 0.1–0.9)
MONO%: 9.9 % (ref 0.0–14.0)
NEUT%: 52.2 % (ref 38.4–76.8)
Platelets: 233 10*3/uL (ref 145–400)
RBC: 4.68 10*6/uL (ref 3.70–5.45)
WBC: 6 10*3/uL (ref 3.9–10.3)

## 2013-02-21 NOTE — Progress Notes (Signed)
OFFICE PROGRESS NOTE  CC  Theresa Obey, MD [address] [place] [address]  Dr. 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 The tumor was estrogen receptor positive and progesterone receptor positive.  PRIOR THERAPY:  #1 status post left lumpectomy followed by radiation therapy to all remaining left breast tissue from 11/04/2007 through 12/17/2007.  #2 patientwill complete tamoxifen 20 mg on a daily basis since 03/26/2008.-to 03/27/13  CURRENT THERAPY: Tamoxifen 20 mg daily.complete in Jan 2015  INTERVAL HISTORY: [name] 52 y.o. female returns for followup visit today. Clinically she seems to be doing well she is tolerating the tamoxifen well. She has minimal side effects from it only hot flashes no vaginal bleeding or discharge. She has had a hysterectomy but has not been seen by her gynecologist for about 2-3 years and I have recommended that she be seen by them soon as she does need a cervical exams should she does have her cervix. Remainder of the 10 point review of systems is negative.   MEDICAL HISTORY: Past Medical History  Diagnosis Date  . Breast cancer   . Anxiety   . Hypertension   . Cough   . Visual disturbance   . Wheezing   . Palpitation   . Diarrhea   . Cramps, muscle, general     ALLERGIES:  is allergic to ceclor; erythromycin; penicillins; and tetracycline.  MEDICATIONS:  Current Outpatient Prescriptions  Medication Sig Dispense Refill  . aspirin 81 MG tablet Take 81 mg by mouth daily.        . Cholecalciferol (VITAMIN D-3) 1000 UNITS CAPS Take 1,000 Units by mouth daily.       . Chromium-Cinnamon (CINNAMON PLUS CHROMIUM) [phone] MCG-MG CAPS Take 1 tablet by mouth daily.      . hydrochlorothiazide (HYDRODIURIL) 25 MG tablet Take 25 mg by mouth daily.        Marland Kitchen levofloxacin (LEVAQUIN) 750 MG tablet Take 1 tablet (750 mg  total) by mouth daily.  6 tablet  0  . LORazepam (ATIVAN) 0.5 MG tablet Take 0.5 mg by mouth every 8 (eight) hours as needed for anxiety.       . Multiple Vitamin (MULTIVITAMIN) capsule Take 1 capsule by mouth daily.      . potassium chloride SA (K-DUR,KLOR-CON) 20 MEQ tablet Take 20 mEq by mouth daily.      . tamoxifen (NOLVADEX) 20 MG tablet TAKE ONE TABLET BY MOUTH EVERY DAY  30 tablet  6  . venlafaxine (EFFEXOR) 50 MG tablet Take 50 mg by mouth 2 (two) times daily.         No current facility-administered medications for this visit.    SURGICAL HISTORY:  Past Surgical History  Procedure Laterality Date  . Abdominal hysterectomy  09/2005  . Breast surgery  09/2007    lumpectomy    REVIEW OF SYSTEMS:  Constitutional: negative Eyes: positive for visual disturbance Ears, nose, mouth, throat, and face: negative Respiratory: negative Cardiovascular: negative Gastrointestinal: negative Genitourinary:negative Integument/breast: positive for pruritus and rash Musculoskeletal:negative Neurological: negative   PHYSICAL EXAMINATION: General appearance: alert, cooperative, appears stated age and no distress Head: Normocephalic, without obvious abnormality, atraumatic Neck: no adenopathy, no carotid bruit, no JVD, supple, symmetrical, trachea midline and thyroid not enlarged, symmetric, no tenderness/mass/nodules Lymph nodes: Cervical, supraclavicular, and axillary nodes normal. Resp: clear to auscultation bilaterally and normal percussion bilaterally Back: symmetric,  no curvature. ROM normal. No CVA tenderness. Cardio: regular rate and rhythm, S1, S2 normal, no murmur, click, rub or gallop GI: soft, non-tender; bowel sounds normal; no masses,  no organomegaly Extremities: extremities normal, atraumatic, no cyanosis or edema Neurologic: Alert and oriented X 3, normal strength and tone. Normal symmetric reflexes. Normal coordination and gait Sensory: normal Motor: grossly normal Reflexes:  normal 2+ and symmetric Bilateral breasts are examined. Right breast without any masses nipple discharge or skin changes. Left breast does reveal well-healed surgical scar it is smaller than the right breast. She is noted to have a little bit of area of what looks like a eczema, and right off of the nipple area LOC complex. There are no palpable masses underneath this area there is no nipple discharge retraction or other skin changes. There is no palpable skin nodularities.  ECOG PERFORMANCE STATUS: 0 - Asymptomatic  Blood pressure 113/83, pulse 112, temperature 98.8 F (37.1 C), temperature source Oral, resp. rate 19, height 5\' 9"  (1.753 m), weight 264 lb 8 oz (119.976 kg).  LABORATORY DATA: Lab Results  Component Value Date   WBC 6.0 02/21/2013   HGB 13.3 02/21/2013   HCT 39.7 02/21/2013   MCV 84.7 02/21/2013   PLT 233 02/21/2013      Chemistry      Component Value Date/Time   NA 145 02/21/2013 1425   NA 133* 12/25/2012 2357   NA 139 11/18/2009 1321   K 3.3* 02/21/2013 1425   K 3.6 12/25/2012 2357   K 3.9 11/18/2009 1321   CL 99 12/25/2012 2357   CL 97* 11/18/2009 1321   CO2 26 02/21/2013 1425   CO2 26 12/25/2012 2357   CO2 29 11/18/2009 1321   BUN 16.4 02/21/2013 1425   BUN 11 12/25/2012 2357   BUN 11 11/18/2009 1321   CREATININE 0.8 02/21/2013 1425   CREATININE 0.71 12/25/2012 2357   CREATININE 0.8 11/18/2009 1321      Component Value Date/Time   CALCIUM 9.8 02/21/2013 1425   CALCIUM 8.9 12/25/2012 2357   CALCIUM 9.3 11/18/2009 1321   ALKPHOS 68 02/21/2013 1425   ALKPHOS 58 08/10/2011 1608   ALKPHOS 68 11/18/2009 1321   AST 16 02/21/2013 1425   AST 14 08/10/2011 1608   AST 20 11/18/2009 1321   ALT 10 02/21/2013 1425   ALT 12 08/10/2011 1608   ALT 13 11/18/2009 1321   BILITOT 0.26 02/21/2013 1425   BILITOT 0.3 08/10/2011 1608   BILITOT 0.60 11/18/2009 1321       RADIOGRAPHIC STUDIES:  No results found.  ASSESSMENT: 52 year old female with history of DCIS of the left breast  originally diagnosed in July 2009 she underwent a lumpectomy followed by radiation therapy and now she is on tamoxifen 20 mg daily since January 2010. Overall she is tolerating the tamoxifen well without any significant complaints.    PLAN:   #1 will complete 5 years of tamoxifen in jan 2015  #2 she will return in one years time for followup visit.  All questions were answered. The patient knows to call the clinic with any problems, questions or concerns. We can certainly see the patient much sooner if necessary.  I spent 25 minutes counseling the patient face to face. The total time spent in the appointment was 40 minutes.    Drue Second, MD Medical/Oncology [facility] [phone] (beeper) [phone] (Office)  02/21/2013, 3:12 PM

## 2013-03-20 ENCOUNTER — Ambulatory Visit
Admission: RE | Admit: 2013-03-20 | Discharge: 2013-03-20 | Disposition: A | Discharge status: Home / Self Care | Payer: BC Managed Care – PPO | Source: Ambulatory Visit | Attending: Oncology | Admitting: Oncology

## 2013-03-20 DIAGNOSIS — D0512 Intraductal carcinoma in situ of left breast: Secondary | ICD-10-CM

## 2013-03-21 ENCOUNTER — Telehealth: Payer: Self-pay | Admitting: *Deleted

## 2013-03-21 NOTE — Telephone Encounter (Signed)
Received call from patient that she needs a letter stating she saw Dr. Humphrey Rolls on 02/21/2013. Please fax to [phone]. This RN faxed a letter to the listed number and patient is aware.

## 2013-05-09 ENCOUNTER — Other Ambulatory Visit (HOSPITAL_COMMUNITY): Payer: Self-pay | Admitting: Family Medicine

## 2013-05-09 DIAGNOSIS — R10813 Right lower quadrant abdominal tenderness: Secondary | ICD-10-CM

## 2013-05-12 ENCOUNTER — Ambulatory Visit (HOSPITAL_COMMUNITY)
Admission: RE | Admit: 2013-05-12 | Discharge: 2013-05-12 | Disposition: A | Discharge status: Home / Self Care | Payer: BC Managed Care – PPO | Source: Ambulatory Visit | Attending: Family Medicine | Admitting: Family Medicine

## 2013-05-12 DIAGNOSIS — R1031 Right lower quadrant pain: Secondary | ICD-10-CM | POA: Insufficient documentation

## 2013-05-12 DIAGNOSIS — R10813 Right lower quadrant abdominal tenderness: Secondary | ICD-10-CM

## 2013-05-12 DIAGNOSIS — Z90710 Acquired absence of both cervix and uterus: Secondary | ICD-10-CM | POA: Insufficient documentation

## 2013-05-14 ENCOUNTER — Encounter: Payer: Self-pay | Admitting: Oncology

## 2013-05-15 ENCOUNTER — Telehealth: Payer: Self-pay | Admitting: *Deleted

## 2013-05-15 NOTE — Telephone Encounter (Signed)
Error

## 2013-06-09 ENCOUNTER — Other Ambulatory Visit (HOSPITAL_COMMUNITY): Payer: Self-pay | Admitting: Family Medicine

## 2013-06-09 DIAGNOSIS — R1031 Right lower quadrant pain: Secondary | ICD-10-CM

## 2013-06-11 ENCOUNTER — Ambulatory Visit (HOSPITAL_COMMUNITY): Payer: BC Managed Care – PPO

## 2013-06-12 ENCOUNTER — Ambulatory Visit (HOSPITAL_COMMUNITY): Payer: BC Managed Care – PPO

## 2013-06-17 ENCOUNTER — Telehealth: Payer: Self-pay | Admitting: Oncology

## 2013-06-17 NOTE — Telephone Encounter (Signed)
Faxed pt medical records to [place]

## 2013-06-19 ENCOUNTER — Ambulatory Visit (HOSPITAL_COMMUNITY): Payer: BC Managed Care – PPO

## 2013-07-16 ENCOUNTER — Other Ambulatory Visit: Payer: Self-pay | Admitting: Oncology

## 2013-07-16 DIAGNOSIS — R928 Other abnormal and inconclusive findings on diagnostic imaging of breast: Secondary | ICD-10-CM

## 2013-08-20 ENCOUNTER — Ambulatory Visit (HOSPITAL_COMMUNITY)
Admission: RE | Admit: 2013-08-20 | Discharge: 2013-08-20 | Disposition: A | Discharge status: Home / Self Care | Payer: BC Managed Care – PPO | Source: Ambulatory Visit | Attending: Oncology | Admitting: Oncology

## 2013-08-20 DIAGNOSIS — Z923 Personal history of irradiation: Secondary | ICD-10-CM | POA: Insufficient documentation

## 2013-08-20 DIAGNOSIS — Z853 Personal history of malignant neoplasm of breast: Secondary | ICD-10-CM | POA: Insufficient documentation

## 2013-08-20 DIAGNOSIS — R928 Other abnormal and inconclusive findings on diagnostic imaging of breast: Secondary | ICD-10-CM

## 2013-10-25 ENCOUNTER — Telehealth: Payer: Self-pay | Admitting: Hematology and Oncology

## 2013-10-25 NOTE — Telephone Encounter (Signed)
, °

## 2013-12-18 ENCOUNTER — Encounter (INDEPENDENT_AMBULATORY_CARE_PROVIDER_SITE_OTHER): Payer: Self-pay | Admitting: *Deleted

## 2013-12-26 ENCOUNTER — Telehealth: Payer: Self-pay | Admitting: Hematology and Oncology

## 2013-12-26 ENCOUNTER — Other Ambulatory Visit: Payer: Self-pay

## 2013-12-26 NOTE — Telephone Encounter (Signed)
pt cld to r/s appt-r/s & gave pt new time & date

## 2014-01-12 ENCOUNTER — Encounter (INDEPENDENT_AMBULATORY_CARE_PROVIDER_SITE_OTHER): Payer: Self-pay | Admitting: Internal Medicine

## 2014-01-19 ENCOUNTER — Ambulatory Visit (INDEPENDENT_AMBULATORY_CARE_PROVIDER_SITE_OTHER): Payer: BC Managed Care – PPO | Admitting: Internal Medicine

## 2014-01-22 ENCOUNTER — Ambulatory Visit: Payer: BC Managed Care – PPO | Admitting: Hematology and Oncology

## 2014-01-22 NOTE — Assessment & Plan Note (Signed)
Left breast DCIS ER/PR positive status post lumpectomy and radiation and completed 5 years of tamoxifen by 03/27/2013  Surveillance: Breast exam today does not reveal any abnormalities. Mammograms done on 08/20/2013 were normal. Patient is asymptomatic and there is no clinical suspicion for disease relapse.  Survivorship: Discussed the importance of physical exercise in decreasing the likelihood of breast cancer recurrence. Recommended 30 mins daily 6 days a week of either brisk walking or cycling or swimming. Encouraged patient to eat more fruits and vegetables and decrease red meat.   Return to clinic once a year for physical exam and followup.

## 2014-01-26 ENCOUNTER — Telehealth: Payer: Self-pay | Admitting: Hematology and Oncology

## 2014-01-26 NOTE — Telephone Encounter (Signed)
, °

## 2014-02-03 ENCOUNTER — Ambulatory Visit: Payer: BC Managed Care – PPO | Admitting: Hematology and Oncology

## 2014-02-04 ENCOUNTER — Telehealth: Payer: Self-pay | Admitting: Hematology and Oncology

## 2014-02-04 NOTE — Telephone Encounter (Signed)
, °

## 2014-02-18 ENCOUNTER — Ambulatory Visit: Payer: BC Managed Care – PPO | Admitting: Hematology and Oncology

## 2014-02-18 ENCOUNTER — Other Ambulatory Visit: Payer: BC Managed Care – PPO

## 2014-02-20 ENCOUNTER — Ambulatory Visit: Payer: BC Managed Care – PPO | Admitting: Oncology

## 2014-02-20 ENCOUNTER — Other Ambulatory Visit: Payer: BC Managed Care – PPO

## 2014-03-02 ENCOUNTER — Telehealth: Payer: Self-pay | Admitting: Oncology

## 2014-03-02 ENCOUNTER — Ambulatory Visit (HOSPITAL_BASED_OUTPATIENT_CLINIC_OR_DEPARTMENT_OTHER): Payer: BC Managed Care – PPO | Admitting: Hematology and Oncology

## 2014-03-02 VITALS — BP 135/98 | HR 97 | Temp 98.1°F | Resp 20 | Ht 69.0 in | Wt 271.3 lb

## 2014-03-02 DIAGNOSIS — Z853 Personal history of malignant neoplasm of breast: Secondary | ICD-10-CM

## 2014-03-02 DIAGNOSIS — C50512 Malignant neoplasm of lower-outer quadrant of left female breast: Secondary | ICD-10-CM

## 2014-03-02 NOTE — Assessment & Plan Note (Addendum)
Left breast DCIS status post lumpectomy July 2009 ER positive PR positive status post lumpectomy followed by radiation therapy from 11/04/2007 through 12/17/2007, currently on tamoxifen 20 mg daily started January 2015.  Breast cancer surveillance: 1. Breast exam done 03/02/2014 normal 2.  Mammogram 08/20/2013 normal  Return to clinic once a year with mammograms and follow-up into the survivorship clinic.  Survivorship: Discussed the importance of physical exercise in decreasing the likelihood of breast cancer recurrence. Recommended 30 mins daily 6 days a week of either brisk walking or cycling or swimming. Encouraged patient to eat more fruits and vegetables and decrease red meat.

## 2014-03-02 NOTE — Progress Notes (Signed)
Patient Care Team: Robert Bellow, MD as PCP - General (Family Medicine)  DIAGNOSIS: Breast cancer of lower-outer quadrant of left female breast   Staging form: Breast, AJCC 7th Edition     Clinical: Stage 0 (Tis (DCIS), N0, M0) - Signed by Rulon Eisenmenger, MD on 03/02/2014   SUMMARY OF ONCOLOGIC HISTORY:   Breast cancer of lower-outer quadrant of left female breast   09/25/2007 Surgery Left breast lumpectomy: DCIS ER/PR positive   11/04/2007 - 12/17/2007 Radiation Therapy Adjuvant radiation therapy   03/26/2008 - 03/27/2013 Anti-estrogen oral therapy Tamoxifen 20 mg daily    CHIEF COMPLIANT: Follow-up on tamoxifen therapy  INTERVAL HISTORY: [name] is a 53 year old African-American lady with above-mentioned history of DCIS treated with lumpectomy followed by adjuvant radiation therapy and completed tamoxifen therapy as of February 2015. She denies any new problems or concerns. She has episodes where she does not have much energy. But she is doing a lot of work at home and at work. She is taking care of her mother and father as well as staying very busy at work place. She is a case Engineer, production.  REVIEW OF SYSTEMS:   Constitutional: Denies fevers, chills or abnormal weight loss Eyes: Denies blurriness of vision Ears, nose, mouth, throat, and face: Denies mucositis or sore throat Respiratory: Denies cough, dyspnea or wheezes Cardiovascular: Denies palpitation, chest discomfort or lower extremity swelling Gastrointestinal:  Denies nausea, heartburn or change in bowel habits Skin: Denies abnormal skin rashes Lymphatics: Denies new lymphadenopathy or easy bruising Neurological:Denies numbness, tingling or new weaknesses Behavioral/Psych: Mood is stable, no new changes  Breast:  denies any pain or lumps or nodules in either breasts All other systems were reviewed with the patient and are negative.  I have reviewed the past medical history, past surgical history,  social history and family history with the patient and they are unchanged from previous note.  ALLERGIES:  is allergic to ceclor; erythromycin; penicillins; and tetracycline.  MEDICATIONS:  Current Outpatient Prescriptions  Medication Sig Dispense Refill  . Cholecalciferol (VITAMIN D-3) 1000 UNITS CAPS Take 1,000 Units by mouth daily.     . Chromium-Cinnamon (CINNAMON PLUS CHROMIUM) [phone] MCG-MG CAPS Take 1 tablet by mouth daily.    . hydrochlorothiazide (HYDRODIURIL) 25 MG tablet Take 25 mg by mouth daily.      Marland Kitchen LORazepam (ATIVAN) 0.5 MG tablet Take 0.5 mg by mouth every 8 (eight) hours as needed for anxiety.     . Methylsulfonylmethane (MSM PO) Take 4,500 mg by mouth daily.    . Nutritional Supplements (GRAPESEED EXTRACT PO) Take by mouth daily.    . potassium chloride SA (K-DUR,KLOR-CON) 20 MEQ tablet Take 20 mEq by mouth daily.    Marland Kitchen venlafaxine (EFFEXOR) 50 MG tablet Take 50 mg by mouth 2 (two) times daily.       No current facility-administered medications for this visit.    PHYSICAL EXAMINATION: ECOG PERFORMANCE STATUS: 0 - Asymptomatic  Filed Vitals:   03/02/14 1557  BP: 135/98  Pulse: 97  Temp: 98.1 F (36.7 C)  Resp: 20   Filed Weights   03/02/14 1557  Weight: 271 lb 4.8 oz (123.061 kg)    GENERAL:alert, no distress and comfortable SKIN: skin color, texture, turgor are normal, no rashes or significant lesions EYES: normal, Conjunctiva are pink and non-injected, sclera clear OROPHARYNX:no exudate, no erythema and lips, buccal mucosa, and tongue normal  NECK: supple, thyroid normal size, non-tender, without nodularity LYMPH:  no palpable lymphadenopathy  in the cervical, axillary or inguinal LUNGS: clear to auscultation and percussion with normal breathing effort HEART: regular rate & rhythm and no murmurs and no lower extremity edema ABDOMEN:abdomen soft, non-tender and normal bowel sounds Musculoskeletal:no cyanosis of digits and no clubbing  NEURO: alert &  oriented x 3 with fluent speech, no focal motor/sensory deficits BREAST: No palpable masses or nodules in either right or left breasts. No palpable axillary supraclavicular or infraclavicular adenopathy no breast tenderness or nipple discharge. Slightly tender breasts   LABORATORY DATA:  I have reviewed the data as listed   Chemistry      Component Value Date/Time   NA 145 02/21/2013 1425   NA 133* 12/25/2012 2357   NA 139 11/18/2009 1321   K 3.3* 02/21/2013 1425   K 3.6 12/25/2012 2357   K 3.9 11/18/2009 1321   CL 99 12/25/2012 2357   CL 97* 11/18/2009 1321   CO2 26 02/21/2013 1425   CO2 26 12/25/2012 2357   CO2 29 11/18/2009 1321   BUN 16.4 02/21/2013 1425   BUN 11 12/25/2012 2357   BUN 11 11/18/2009 1321   CREATININE 0.8 02/21/2013 1425   CREATININE 0.71 12/25/2012 2357   CREATININE 0.8 11/18/2009 1321      Component Value Date/Time   CALCIUM 9.8 02/21/2013 1425   CALCIUM 8.9 12/25/2012 2357   CALCIUM 9.3 11/18/2009 1321   ALKPHOS 68 02/21/2013 1425   ALKPHOS 58 08/10/2011 1608   ALKPHOS 68 11/18/2009 1321   AST 16 02/21/2013 1425   AST 14 08/10/2011 1608   AST 20 11/18/2009 1321   ALT 10 02/21/2013 1425   ALT 12 08/10/2011 1608   ALT 13 11/18/2009 1321   BILITOT 0.26 02/21/2013 1425   BILITOT 0.3 08/10/2011 1608   BILITOT 0.60 11/18/2009 1321       Lab Results  Component Value Date   WBC 6.0 02/21/2013   HGB 13.3 02/21/2013   HCT 39.7 02/21/2013   MCV 84.7 02/21/2013   PLT 233 02/21/2013   NEUTROABS 3.1 02/21/2013   ASSESSMENT & PLAN:  Breast cancer of lower-outer quadrant of left female breast Left breast DCIS status post lumpectomy July 2009 ER positive PR positive status post lumpectomy followed by radiation therapy from 11/04/2007 through 12/17/2007, currently on tamoxifen 20 mg daily started January 2015.  Breast cancer surveillance: 1. Breast exam done 03/02/2014 normal 2.  Mammogram 08/20/2013 normal  Return to clinic once a year with  mammograms and follow-up into the survivorship clinic.  Survivorship: Discussed the importance of physical exercise in decreasing the likelihood of breast cancer recurrence. Recommended 30 mins daily 6 days a week of either brisk walking or cycling or swimming. Encouraged patient to eat more fruits and vegetables and decrease red meat.        No orders of the defined types were placed in this encounter.   The patient has a good understanding of the overall plan. she agrees with it. She will call with any problems that may develop before her next visit here.   Rulon Eisenmenger, MD 03/02/2014 4:54 PM

## 2014-03-02 NOTE — Telephone Encounter (Signed)
per pof to sch pt appt-gave pt copy of sch °

## 2014-06-30 ENCOUNTER — Other Ambulatory Visit (HOSPITAL_COMMUNITY): Payer: Self-pay | Admitting: Family Medicine

## 2014-06-30 DIAGNOSIS — R1031 Right lower quadrant pain: Secondary | ICD-10-CM

## 2014-07-07 ENCOUNTER — Encounter (HOSPITAL_COMMUNITY): Payer: Self-pay

## 2014-07-07 ENCOUNTER — Ambulatory Visit (HOSPITAL_COMMUNITY)
Admission: RE | Admit: 2014-07-07 | Discharge: 2014-07-07 | Disposition: A | Discharge status: Home / Self Care | Payer: BLUE CROSS/BLUE SHIELD | Source: Ambulatory Visit | Attending: Family Medicine | Admitting: Family Medicine

## 2014-07-07 DIAGNOSIS — R1031 Right lower quadrant pain: Secondary | ICD-10-CM | POA: Diagnosis not present

## 2014-07-07 MED ORDER — IOHEXOL 300 MG/ML  SOLN
100.0000 mL | Freq: Once | INTRAMUSCULAR | Status: AC | PRN
  Administered 2014-07-07: 100 mL via INTRAVENOUS

## 2014-08-11 ENCOUNTER — Other Ambulatory Visit (HOSPITAL_COMMUNITY): Payer: Self-pay | Admitting: Family Medicine

## 2014-08-11 DIAGNOSIS — R921 Mammographic calcification found on diagnostic imaging of breast: Secondary | ICD-10-CM

## 2014-08-12 ENCOUNTER — Telehealth: Payer: Self-pay

## 2014-08-12 NOTE — Telephone Encounter (Signed)
Pt was referred by Dr. Karie Kirks for screening colonoscopy. She said her last colonoscopy was done 10-12 years ago by Dr. Vira Agar at [facility] in [place]. Her father had lots of polyps and some precancerous polyps was the reason for her early colonoscopy. She is having some weird feelings in her stomach and some bloating along with change in BM's, with more loose bowels. Scheduled for OV with Laban Emperor, NP, on 08/21/2014 at 8:30 AM.

## 2014-08-21 ENCOUNTER — Ambulatory Visit (INDEPENDENT_AMBULATORY_CARE_PROVIDER_SITE_OTHER): Payer: BLUE CROSS/BLUE SHIELD | Admitting: Gastroenterology

## 2014-08-21 ENCOUNTER — Encounter: Payer: Self-pay | Admitting: Gastroenterology

## 2014-08-21 ENCOUNTER — Other Ambulatory Visit: Payer: Self-pay

## 2014-08-21 VITALS — BP 155/97 | HR 92 | Temp 97.2°F | Ht 69.0 in | Wt 269.8 lb

## 2014-08-21 DIAGNOSIS — Z1211 Encounter for screening for malignant neoplasm of colon: Secondary | ICD-10-CM | POA: Insufficient documentation

## 2014-08-21 DIAGNOSIS — R197 Diarrhea, unspecified: Secondary | ICD-10-CM | POA: Insufficient documentation

## 2014-08-21 MED ORDER — HYOSCYAMINE SULFATE 0.125 MG SL SUBL: 0.1250 mg | SUBLINGUAL_TABLET | SUBLINGUAL | Status: DC | PRN

## 2014-08-21 MED ORDER — PEG-KCL-NACL-NASULF-NA ASC-C 100 G PO SOLR: 1.0000 | ORAL | Status: DC

## 2014-08-21 NOTE — Patient Instructions (Signed)
You may use Levsin as needed for abdominal cramping and loose stool.  We have scheduled you for a colonoscopy with Dr. Oneida Alar in the near future!

## 2014-08-21 NOTE — Progress Notes (Signed)
Referring Provider: Lemmie Evens, MD Primary Care Physician:  Robert Bellow, MD Primary Gastroenterologist:  Dr. Oneida Alar   Chief Complaint  Patient presents with  . check up TCS    HPI:   [name] is a 54 y.o. female presenting today at the request of Lemmie Evens, MD secondary to need for colonoscopy.   At least 10 years ago colonoscopy at [facility]. Father with history of multiple adenomas in the past. States foul smelling stool. Abdominal bloating. Lower abdominal cramping almost like a "bad period". Lower GI symptoms X 3 months with loose stool, abdominal cramping, intermittent. No rectal bleeding.    Has been stricter with diet, infused water with sliced cucumbers, tumeric powder, celery, sliced ginger root. Lost 4 lbs purposefully. Feels improved with abdominal symptoms. BMs have improved since doing this. Some "normal". Pain is episodic. Loose stool now 60% of the time. Difficulty in targeting any triggers. Lactose intolerance. Stays away from milk.   Stool studies completed through PCP with negative Cdiff, stool culture, and Giardia.   Past Medical History  Diagnosis Date  . Breast cancer 2009    Tamoxifen  . Anxiety   . Hypertension   . Cough   . Visual disturbance   . Wheezing   . Palpitation   . Diarrhea   . Cramps, muscle, general     Past Surgical History  Procedure Laterality Date  . Abdominal hysterectomy  09/2005    still have ovaries  . Breast surgery  09/2007    lumpectomy    Current Outpatient Prescriptions  Medication Sig Dispense Refill  . Cholecalciferol (VITAMIN D-3) 1000 UNITS CAPS Take 1,000 Units by mouth daily.     . Chromium-Cinnamon (CINNAMON PLUS CHROMIUM) [phone] MCG-MG CAPS Take 1 tablet by mouth daily.    . hydrochlorothiazide (HYDRODIURIL) 25 MG tablet Take 25 mg by mouth daily.      Marland Kitchen LORazepam (ATIVAN) 0.5 MG tablet Take 0.5 mg by mouth every 8 (eight) hours as needed for anxiety.     . Methylsulfonylmethane  (MSM PO) Take 4,500 mg by mouth daily.    . Nutritional Supplements (GRAPESEED EXTRACT PO) Take by mouth daily.    . potassium chloride SA (K-DUR,KLOR-CON) 20 MEQ tablet Take 20 mEq by mouth daily.    Marland Kitchen venlafaxine (EFFEXOR) 50 MG tablet Take 50 mg by mouth 2 (two) times daily.       No current facility-administered medications for this visit.    Allergies as of 08/21/2014 - Review Complete 08/21/2014  Allergen Reaction Noted  . Ceclor [cefaclor] Other (See Comments) 02/08/2011  . Erythromycin Hives and Itching 12/10/2009  . Penicillins Hives and Itching 12/10/2009  . Tetracycline Hives and Itching 12/10/2009  . Other Itching 08/21/2014    Family History  Problem Relation Age of Onset  . Diabetes Mother   . Stroke Mother   . Hypertension Mother   . Diabetes Father   . Hypertension Sister   . Hypertension Brother   . Cancer Paternal Aunt     ovarian  . Cancer Cousin     colon  . Colon cancer      no first degree relatives    History   Social History  . Marital Status: Divorced    Spouse Name: N/A  . Number of Children: N/A  . Years of Education: N/A   Occupational History  . Social Services    Social History Main Topics  . Smoking status: Former Smoker  Quit date: 03/02/1990  . Smokeless tobacco: Never Used  . Alcohol Use: No  . Drug Use: No  . Sexual Activity: Yes   Other Topics Concern  . Not on file   Social History Narrative    Review of Systems: Gen: Denies any fever, chills, loss of appetite, fatigue, weight loss. CV: +palpitations Resp: Denies shortness of breath with rest, cough, wheezing GI: see HPI GU : Denies urinary burning, urinary frequency, urinary incontinence.  MS: Denies joint pain, muscle weakness, cramps, limited movement Derm: Denies rash, itching, dry skin Psych: Denies depression, anxiety, confusion or memory loss  Heme: Denies bruising, bleeding, and enlarged lymph nodes.  Physical Exam: BP 155/97 mmHg  Pulse 92   Temp(Src) 97.2 F (36.2 C)  Ht 5\' 9"  (1.753 m)  Wt 269 lb 12.8 oz (122.38 kg)  BMI 39.82 kg/m2 General:   Alert and oriented. Well-developed, well-nourished, pleasant and cooperative. Head:  Normocephalic and atraumatic. Eyes:  Conjunctiva pink, sclera clear, no icterus.   Conjunctiva pink. Ears:  Normal auditory acuity. Nose:  No deformity, discharge,  or lesions. Lungs:  Clear to auscultation bilaterally, without wheezing, rales, or rhonchi.  Heart:  S1, S2 present without murmurs noted.  Abdomen:  +BS, soft, non-tender and non-distended. Without mass or HSM. No rebound or guarding. No hernias noted. Rectal:  Deferred  Msk:  Symmetrical without gross deformities. Normal posture. Extremities:  Without  edema. Neurologic:  Alert and  oriented x4;  grossly normal neurologically. Skin:  Intact, warm and dry without significant lesions or rashes Psych:  Alert and cooperative. Normal mood and affect.

## 2014-08-24 NOTE — Assessment & Plan Note (Signed)
54 year old female with recent change in bowel habits to include abdominal bloating, cramping, and intermittent loose stool. Stool studies from PCP normal (negative Cdiff, Giardia, and cultures). Improvement noted with diet. Query element of IBS, dietary and behavior as large contributors. As she has loose stool only "60%" of the time, will hold on Viberzi for now and trial Levsin prn. If colonoscopy negative and persistent/worsening loose stool/cramping, would consider Viberzi. As of note, at time of consultation, symptoms significantly improved with dietary modification.

## 2014-08-24 NOTE — Assessment & Plan Note (Signed)
Needs routine screening colonoscopy, with last colonoscopy in remote past at [place]. No family history of colon cancer but father with history of multiple adenomas.   Proceed with colonoscopy with Dr. Oneida Alar in the near future. The risks, benefits, and alternatives have been discussed in detail with the patient. They state understanding and desire to proceed.  Propofol due to polypharmacy

## 2014-08-25 ENCOUNTER — Encounter (HOSPITAL_COMMUNITY): Payer: BLUE CROSS/BLUE SHIELD

## 2014-08-26 NOTE — Progress Notes (Signed)
cc'd to pcp 

## 2014-08-31 NOTE — Patient Instructions (Signed)
Your procedure is scheduled on:  09/08/14  Report to [facility] at  7:15 AM.  Call this number if you have problems the morning of surgery: [phone]   Remember:   May have clear liquids:until Midnight .  Clear liquids include soda, tea, black coffee, apple or grape juice, broth.  Take these medicines the morning of surgery with A SIP OF WATER: Venlafaxine. You make take your Ativan if needed.   Do not wear jewelry, make-up or nail polish.  Do not wear lotions, powders, or perfumes. You may wear deodorant.  Do not bring valuables to the hospital.  Contacts, dentures or bridgework may not be worn into surgery.  Leave suitcase in the car. After surgery it may be brought to your room.  For patients admitted to the hospital, checkout time is 11:00 AM the day of discharge.   Patients discharged the day of surgery will not be allowed to drive home.  Special Instructions: Use your bowel prep as directed by your doctor.   Please read over the following fact sheets that you were given: Pain Booklet, Anesthesia Post-op Instructions and Care and Recovery After Surgery    Colonoscopy A colonoscopy is an exam to evaluate your entire colon. In this exam, your colon is cleansed. A long fiberoptic tube is inserted through your rectum and into your colon. The fiberoptic scope (endoscope) is a long bundle of enclosed and very flexible fibers. These fibers transmit light to the area examined and send images from that area to your caregiver. Discomfort is usually minimal. You may be given a drug to help you sleep (sedative) during or prior to the procedure. This exam helps to detect lumps (tumors), polyps, inflammation, and areas of bleeding. Your caregiver may also take a small piece of tissue (biopsy) that will be examined under a microscope. LET YOUR CAREGIVER KNOW ABOUT:   Allergies to food or medicine.   Medicines taken, including vitamins, herbs, eyedrops, over-the-counter medicines, and creams.    Use of steroids (by mouth or creams).   Previous problems with anesthetics or numbing medicines.   History of bleeding problems or blood clots.   Previous surgery.   Other health problems, including diabetes and kidney problems.   Possibility of pregnancy, if this applies.  BEFORE THE PROCEDURE   A clear liquid diet may be required for 2 days before the exam.   Ask your caregiver about changing or stopping your regular medications.   Liquid injections (enemas) or laxatives may be required.   A large amount of electrolyte solution may be given to you to drink over a short period of time. This solution is used to clean out your colon.   You should be present 60 minutes prior to your procedure or as directed by your caregiver.  AFTER THE PROCEDURE   If you received a sedative or pain relieving medication, you will need to arrange for someone to drive you home.   Occasionally, there is a little blood passed with the first bowel movement. Do not be concerned.  FINDING OUT THE RESULTS OF YOUR TEST Not all test results are available during your visit. If your test results are not back during the visit, make an appointment with your caregiver to find out the results. Do not assume everything is normal if you have not heard from your caregiver or the medical facility. It is important for you to follow up on all of your test results. HOME CARE INSTRUCTIONS   It is not unusual  to pass moderate amounts of gas and experience mild abdominal cramping following the procedure. This is due to air being used to inflate your colon during the exam. Walking or a warm pack on your belly (abdomen) may help.   You may resume all normal meals and activities after sedatives and medicines have worn off.   Only take over-the-counter or prescription medicines for pain, discomfort, or fever as directed by your caregiver. Do not use aspirin or blood thinners if a biopsy was taken. Consult your caregiver for  medicine usage if biopsies were taken.  SEEK IMMEDIATE MEDICAL CARE IF:   You have a fever.   You pass large blood clots or fill a toilet with blood following the procedure. This may also occur 10 to 14 days following the procedure. This is more likely if a biopsy was taken.   You develop abdominal pain that keeps getting worse and cannot be relieved with medicine.  Document Released: 02/25/2000 Document Revised: 02/16/2011 Document Reviewed: 10/10/2007 [facility] Patient Information 2012 [place].   PATIENT INSTRUCTIONS POST-ANESTHESIA  IMMEDIATELY FOLLOWING SURGERY:  Do not drive or operate machinery for the first twenty four hours after surgery.  Do not make any important decisions for twenty four hours after surgery or while taking narcotic pain medications or sedatives.  If you develop intractable nausea and vomiting or a severe headache please notify your doctor immediately.  FOLLOW-UP:  Please make an appointment with your surgeon as instructed. You do not need to follow up with anesthesia unless specifically instructed to do so.  WOUND CARE INSTRUCTIONS (if applicable):  Keep a dry clean dressing on the anesthesia/puncture wound site if there is drainage.  Once the wound has quit draining you may leave it open to air.  Generally you should leave the bandage intact for twenty four hours unless there is drainage.  If the epidural site drains for more than 36-48 hours please call the anesthesia department.  QUESTIONS?:  Please feel free to call your physician or the hospital operator if you have any questions, and they will be happy to assist you.

## 2014-09-03 NOTE — Patient Instructions (Signed)
[name]  09/03/2014     @PREFPERIOPPHARMACY @   Your procedure is scheduled on 09/08/2014  Report to [facility] at  70  A.M.  Call this number if you have problems the morning of surgery:  [phone]   Remember:  Do not eat food or drink liquids after midnight.  Take these medicines the morning of surgery with A SIP OF WATER  Hydrodiuril, ativan, effexor.   Do not wear jewelry, make-up or nail polish.  Do not wear lotions, powders, or perfumes.    Do not shave 48 hours prior to surgery.  Men may shave face and neck.  Do not bring valuables to the hospital.  [facility] is not responsible for any belongings or valuables.  Contacts, dentures or bridgework may not be worn into surgery.  Leave your suitcase in the car.  After surgery it may be brought to your room.  For patients admitted to the hospital, discharge time will be determined by your treatment team.  Patients discharged the day of surgery will not be allowed to drive home.   Name and phone number of your driver:  family Special instructions: Follow your prep and diet instructions from Dr Oneida Alar office.  Please read over the following fact sheets that you were given. Pain Booklet, Coughing and Deep Breathing, Surgical Site Infection Prevention, Anesthesia Post-op Instructions and Care and Recovery After Surgery      Colonoscopy A colonoscopy is an exam to look at the entire large intestine (colon). This exam can help find problems such as tumors, polyps, inflammation, and areas of bleeding. The exam takes about 1 hour.  LET [facility] CARE PROVIDER KNOW ABOUT:   Any allergies you have.  All medicines you are taking, including vitamins, herbs, eye drops, creams, and over-the-counter medicines.  Previous problems you or members of your family have had with the use of anesthetics.  Any blood disorders you have.  Previous surgeries you have had.  Medical conditions you have. RISKS AND  COMPLICATIONS  Generally, this is a safe procedure. However, as with any procedure, complications can occur. Possible complications include:  Bleeding.  Tearing or rupture of the colon wall.  Reaction to medicines given during the exam.  Infection (rare). BEFORE THE PROCEDURE   Ask your health care provider about changing or stopping your regular medicines.  You may be prescribed an oral bowel prep. This involves drinking a large amount of medicated liquid, starting the day before your procedure. The liquid will cause you to have multiple loose stools until your stool is almost clear or light green. This cleans out your colon in preparation for the procedure.  Do not eat or drink anything else once you have started the bowel prep, unless your health care provider tells you it is safe to do so.  Arrange for someone to drive you home after the procedure. PROCEDURE   You will be given medicine to help you relax (sedative).  You will lie on your side with your knees bent.  A long, flexible tube with a light and camera on the end (colonoscope) will be inserted through the rectum and into the colon. The camera sends video back to a computer screen as it moves through the colon. The colonoscope also releases carbon dioxide gas to inflate the colon. This helps your health care provider see the area better.  During the exam, your health care provider may take a small tissue sample (biopsy) to be  examined under a microscope if any abnormalities are found.  The exam is finished when the entire colon has been viewed. AFTER THE PROCEDURE   Do not drive for 24 hours after the exam.  You may have a small amount of blood in your stool.  You may pass moderate amounts of gas and have mild abdominal cramping or bloating. This is caused by the gas used to inflate your colon during the exam.  Ask when your test results will be ready and how you will get your results. Make sure you get your test  results. Document Released: 02/25/2000 Document Revised: 12/18/2012 Document Reviewed: 11/04/2012 [facility] Patient Information 2015 [place], [place]. This information is not intended to replace advice given to you by your health care provider. Make sure you discuss any questions you have with your health care provider. PATIENT INSTRUCTIONS POST-ANESTHESIA  IMMEDIATELY FOLLOWING SURGERY:  Do not drive or operate machinery for the first twenty four hours after surgery.  Do not make any important decisions for twenty four hours after surgery or while taking narcotic pain medications or sedatives.  If you develop intractable nausea and vomiting or a severe headache please notify your doctor immediately.  FOLLOW-UP:  Please make an appointment with your surgeon as instructed. You do not need to follow up with anesthesia unless specifically instructed to do so.  WOUND CARE INSTRUCTIONS (if applicable):  Keep a dry clean dressing on the anesthesia/puncture wound site if there is drainage.  Once the wound has quit draining you may leave it open to air.  Generally you should leave the bandage intact for twenty four hours unless there is drainage.  If the epidural site drains for more than 36-48 hours please call the anesthesia department.  QUESTIONS?:  Please feel free to call your physician or the hospital operator if you have any questions, and they will be happy to assist you.

## 2014-09-04 ENCOUNTER — Encounter (HOSPITAL_COMMUNITY)
Admission: RE | Admit: 2014-09-04 | Discharge: 2014-09-04 | Disposition: A | Discharge status: Home / Self Care | Payer: BLUE CROSS/BLUE SHIELD | Source: Ambulatory Visit | Attending: Gastroenterology | Admitting: Gastroenterology

## 2014-09-04 ENCOUNTER — Encounter (HOSPITAL_COMMUNITY): Payer: Self-pay

## 2014-09-04 DIAGNOSIS — Z01818 Encounter for other preprocedural examination: Secondary | ICD-10-CM | POA: Diagnosis not present

## 2014-09-04 HISTORY — DX: Depression, unspecified: F32.A

## 2014-09-04 HISTORY — DX: Major depressive disorder, single episode, unspecified: F32.9

## 2014-09-04 HISTORY — DX: Unspecified asthma, uncomplicated: J45.909

## 2014-09-04 LAB — CBC
HCT: 41.6 % (ref 36.0–46.0)
Hemoglobin: 13.8 g/dL (ref 12.0–15.0)
MCH: 28.5 pg (ref 26.0–34.0)
MCHC: 33.2 g/dL (ref 30.0–36.0)
MCV: 86 fL (ref 78.0–100.0)
PLATELETS: 228 10*3/uL (ref 150–400)
RBC: 4.84 MIL/uL (ref 3.87–5.11)
RDW: 13.4 % (ref 11.5–15.5)
WBC: 5.7 10*3/uL (ref 4.0–10.5)

## 2014-09-04 LAB — BASIC METABOLIC PANEL
Anion gap: 11 (ref 5–15)
BUN: 11 mg/dL (ref 6–20)
CO2: 29 mmol/L (ref 22–32)
Calcium: 9 mg/dL (ref 8.9–10.3)
Chloride: 102 mmol/L (ref 101–111)
Creatinine, Ser: 0.7 mg/dL (ref 0.44–1.00)
GFR calc Af Amer: >60 mL/min (ref >60)
GFR calc non Af Amer: >60 mL/min (ref >60)
Glucose, Bld: 94 mg/dL (ref 65–99)
POTASSIUM: 4 mmol/L (ref 3.5–5.1)
Sodium: 142 mmol/L (ref 135–145)

## 2014-09-04 NOTE — Progress Notes (Signed)
   09/04/14 1027  [place]  Have you ever been diagnosed with sleep apnea through a sleep study? No  Do you snore loudly (loud enough to be heard through closed doors)?  1  Do you often feel tired, fatigued, or sleepy during the daytime? 1  Has anyone observed you stop breathing during your sleep? 0  Do you have, or are you being treated for high blood pressure? 1  BMI more than 35 kg/m2? 1  Age over 52 years old? 1  Neck circumference greater than 40 cm/16 inches? 0  Gender: 0

## 2014-09-07 ENCOUNTER — Other Ambulatory Visit: Payer: Self-pay

## 2014-09-07 ENCOUNTER — Telehealth: Payer: Self-pay | Admitting: Gastroenterology

## 2014-09-07 MED ORDER — PEG-KCL-NACL-NASULF-NA ASC-C 100 G PO SOLR: 1.0000 | ORAL | Status: DC

## 2014-09-07 NOTE — Telephone Encounter (Signed)
Patient called to say that she is having her procedure in the morning and her prep prescription needs to be sent to [facility] instead (I believe she said it had been sent to [facility]).

## 2014-09-07 NOTE — Telephone Encounter (Signed)
I have sent it to the correct drug store for her. She is aware

## 2014-09-08 ENCOUNTER — Ambulatory Visit (HOSPITAL_COMMUNITY): Payer: BLUE CROSS/BLUE SHIELD | Admitting: Anesthesiology

## 2014-09-08 ENCOUNTER — Ambulatory Visit (HOSPITAL_COMMUNITY)
Admission: RE | Admit: 2014-09-08 | Discharge: 2014-09-08 | Disposition: A | Discharge status: Home / Self Care | Payer: BLUE CROSS/BLUE SHIELD | Source: Ambulatory Visit | Attending: Gastroenterology | Admitting: Gastroenterology

## 2014-09-08 ENCOUNTER — Encounter (HOSPITAL_COMMUNITY): Payer: Self-pay | Admitting: *Deleted

## 2014-09-08 ENCOUNTER — Ambulatory Visit (HOSPITAL_COMMUNITY)
Admission: RE | Admit: 2014-09-08 | Discharge: 2014-09-08 | Disposition: A | Discharge status: Home / Self Care | Payer: BLUE CROSS/BLUE SHIELD | Source: Ambulatory Visit | Attending: Family Medicine | Admitting: Family Medicine

## 2014-09-08 ENCOUNTER — Encounter (HOSPITAL_COMMUNITY)
Admission: RE | Disposition: A | Discharge status: Home / Self Care | Payer: Self-pay | Source: Ambulatory Visit | Attending: Gastroenterology

## 2014-09-08 DIAGNOSIS — Z853 Personal history of malignant neoplasm of breast: Secondary | ICD-10-CM | POA: Insufficient documentation

## 2014-09-08 DIAGNOSIS — K648 Other hemorrhoids: Secondary | ICD-10-CM | POA: Insufficient documentation

## 2014-09-08 DIAGNOSIS — R921 Mammographic calcification found on diagnostic imaging of breast: Secondary | ICD-10-CM

## 2014-09-08 DIAGNOSIS — R194 Change in bowel habit: Secondary | ICD-10-CM | POA: Diagnosis not present

## 2014-09-08 DIAGNOSIS — K5730 Diverticulosis of large intestine without perforation or abscess without bleeding: Secondary | ICD-10-CM | POA: Diagnosis not present

## 2014-09-08 DIAGNOSIS — Z1231 Encounter for screening mammogram for malignant neoplasm of breast: Secondary | ICD-10-CM | POA: Insufficient documentation

## 2014-09-08 DIAGNOSIS — Z923 Personal history of irradiation: Secondary | ICD-10-CM | POA: Insufficient documentation

## 2014-09-08 HISTORY — PX: BIOPSY: SHX5522

## 2014-09-08 HISTORY — PX: COLONOSCOPY WITH PROPOFOL: SHX5780

## 2014-09-08 SURGERY — COLONOSCOPY WITH PROPOFOL
Anesthesia: Monitor Anesthesia Care | Site: Rectum

## 2014-09-08 MED ORDER — FENTANYL CITRATE (PF) 100 MCG/2ML IJ SOLN
INTRAMUSCULAR | Status: AC
  Filled 2014-09-08: qty 2

## 2014-09-08 MED ORDER — LACTATED RINGERS IV SOLN
INTRAVENOUS | Status: DC
  Administered 2014-09-08: 08:00:00 via INTRAVENOUS

## 2014-09-08 MED ORDER — MIDAZOLAM HCL 2 MG/2ML IJ SOLN
1.0000 mg | INTRAMUSCULAR | Status: DC | PRN
  Administered 2014-09-08 (×2): 1 mg via INTRAVENOUS

## 2014-09-08 MED ORDER — STERILE WATER FOR IRRIGATION IR SOLN
Status: DC | PRN
  Administered 2014-09-08: 1000 mL

## 2014-09-08 MED ORDER — PROPOFOL 10 MG/ML IV BOLUS
INTRAVENOUS | Status: AC
  Filled 2014-09-08: qty 20

## 2014-09-08 MED ORDER — ONDANSETRON HCL 4 MG/2ML IJ SOLN
INTRAMUSCULAR | Status: AC
  Filled 2014-09-08: qty 2

## 2014-09-08 MED ORDER — PROPOFOL INFUSION 10 MG/ML OPTIME
INTRAVENOUS | Status: DC | PRN
  Administered 2014-09-08: 110 ug/kg/min via INTRAVENOUS
  Administered 2014-09-08: 90 ug/kg/min via INTRAVENOUS
  Administered 2014-09-08: 125 ug/kg/min via INTRAVENOUS

## 2014-09-08 MED ORDER — MIDAZOLAM HCL 2 MG/2ML IJ SOLN
INTRAMUSCULAR | Status: AC
  Filled 2014-09-08: qty 2

## 2014-09-08 MED ORDER — ONDANSETRON HCL 4 MG/2ML IJ SOLN: 4.0000 mg | Freq: Once | INTRAMUSCULAR | Status: DC | PRN

## 2014-09-08 MED ORDER — LIDOCAINE HCL (PF) 1 % IJ SOLN
INTRAMUSCULAR | Status: AC
  Filled 2014-09-08: qty 5

## 2014-09-08 MED ORDER — ONDANSETRON HCL 4 MG/2ML IJ SOLN
4.0000 mg | Freq: Once | INTRAMUSCULAR | Status: AC
  Administered 2014-09-08: 4 mg via INTRAVENOUS

## 2014-09-08 MED ORDER — WATER FOR IRRIGATION, STERILE IR SOLN
Status: DC | PRN
  Administered 2014-09-08: 1000 mL

## 2014-09-08 MED ORDER — LIDOCAINE HCL (CARDIAC) 10 MG/ML IV SOLN
INTRAVENOUS | Status: DC | PRN
  Administered 2014-09-08: 50 mg via INTRAVENOUS

## 2014-09-08 MED ORDER — FENTANYL CITRATE (PF) 100 MCG/2ML IJ SOLN: 25.0000 ug | INTRAMUSCULAR | Status: DC | PRN

## 2014-09-08 MED ORDER — FENTANYL CITRATE (PF) 100 MCG/2ML IJ SOLN
25.0000 ug | INTRAMUSCULAR | Status: AC
  Administered 2014-09-08 (×2): 25 ug via INTRAVENOUS

## 2014-09-08 SURGICAL SUPPLY — 22 items
ELECT REM PT RETURN 9FT ADLT (ELECTROSURGICAL)
ELECTRODE REM PT RTRN 9FT ADLT (ELECTROSURGICAL) IMPLANT
FCP BXJMBJMB 240X2.8X (CUTTING FORCEPS)
FLOOR PAD 36X40 (MISCELLANEOUS) ×4
FORCEPS BIOP RAD 4 LRG CAP 4 (CUTTING FORCEPS) ×2 IMPLANT
FORCEPS BIOP RJ4 240 W/NDL (CUTTING FORCEPS)
FORCEPS BXJMBJMB 240X2.8X (CUTTING FORCEPS) IMPLANT
FORMALIN 10 PREFIL 20ML (MISCELLANEOUS) ×2 IMPLANT
INJECTOR/SNARE I SNARE (MISCELLANEOUS) IMPLANT
KIT ENDO PROCEDURE PEN (KITS) ×4 IMPLANT
MANIFOLD NEPTUNE II (INSTRUMENTS) ×2 IMPLANT
NDL SCLEROTHERAPY 25GX240 (NEEDLE) IMPLANT
NEEDLE SCLEROTHERAPY 25GX240 (NEEDLE) IMPLANT
OVERTUBE ENDOCUFF GREEN (MISCELLANEOUS) ×2 IMPLANT
PAD FLOOR 36X40 (MISCELLANEOUS) ×2 IMPLANT
PROBE APC STR FIRE (PROBE) IMPLANT
PROBE INJECTION GOLD (MISCELLANEOUS)
PROBE INJECTION GOLD 7FR (MISCELLANEOUS) IMPLANT
SNARE SHORT THROW 13M SML OVAL (MISCELLANEOUS) ×4 IMPLANT
TRAP SPECIMEN MUCOUS 40CC (MISCELLANEOUS) IMPLANT
TUBING IRRIGATION ENDOGATOR (MISCELLANEOUS) ×2 IMPLANT
WATER STERILE IRR 1000ML POUR (IV SOLUTION) ×2 IMPLANT

## 2014-09-08 NOTE — Discharge Instructions (Signed)
YOU DID NOT HAVE ANY POLYPS. You have small internal hemorrhoids.  YOU HAVE diverticulosis IN YOUR LEFT COLON.  I BIOPSIED YOUR COLON.    CONTINUE YOUR WEIGHT LOSS EFFORTS. OBESITY IS ASSOCIATED WITH AN INCREASE RISK OF ALL CANCERS, ESPECIALLY COLON CANCER.  Follow a HIGH FIBER/LOW FAT DIET. AVOID ITEMS THAT CAUSE BLOATING. See info below.  USE PREPARATION H FOUR TIMES A DAY FOR 7 DAYS IF YOU HAVE RECTAL BLEEDING OR PAIN.   YOUR BIOPSY RESULTS WILL BE AVAILABLE IN MY CHART AFTER JUN 30 AND MY OFFICE WILL CONTACT YOU IN 10-14 DAYS WITH YOUR RESULTS.   Next colonoscopy in 10 years.  Colonoscopy Care After Read the instructions outlined below and refer to this sheet in the next week. These discharge instructions provide you with general information on caring for yourself after you leave the hospital. While your treatment has been planned according to the most current medical practices available, unavoidable complications occasionally occur. If you have any problems or questions after discharge, call DR. Elita Dame, [phone].  ACTIVITY  You may resume your regular activity, but move at a slower pace for the next 24 hours.   Take frequent rest periods for the next 24 hours.   Walking will help get rid of the air and reduce the bloated feeling in your belly (abdomen).   No driving for 24 hours (because of the medicine (anesthesia) used during the test).   You may shower.   Do not sign any important legal documents or operate any machinery for 24 hours (because of the anesthesia used during the test).    NUTRITION  Drink plenty of fluids.   You may resume your normal diet as instructed by your doctor.   Begin with a light meal and progress to your normal diet. Heavy or fried foods are harder to digest and may make you feel sick to your stomach (nauseated).   Avoid alcoholic beverages for 24 hours or as instructed.    MEDICATIONS  You may resume your normal  medications.   WHAT YOU CAN EXPECT TODAY  Some feelings of bloating in the abdomen.   Passage of more gas than usual.   Spotting of blood in your stool or on the toilet paper  .  IF YOU HAD POLYPS REMOVED DURING THE COLONOSCOPY:  Eat a soft diet IF YOU HAVE NAUSEA, BLOATING, ABDOMINAL PAIN, OR VOMITING.    FINDING OUT THE RESULTS OF YOUR TEST Not all test results are available during your visit. DR. Oneida Alar WILL CALL YOU WITHIN 14 DAYS OF YOUR PROCEDUE WITH YOUR RESULTS. Do not assume everything is normal if you have not heard from DR. Callahan Wild, CALL HER OFFICE AT [phone].  SEEK IMMEDIATE MEDICAL ATTENTION AND CALL THE OFFICE: [phone] IF:  You have more than a spotting of blood in your stool.   Your belly is swollen (abdominal distention).   You are nauseated or vomiting.   You have a temperature over 101F.   You have abdominal pain or discomfort that is severe or gets worse throughout the day.  High-Fiber Diet A high-fiber diet changes your normal diet to include more whole grains, legumes, fruits, and vegetables. Changes in the diet involve replacing refined carbohydrates with unrefined foods. The calorie level of the diet is essentially unchanged. The Dietary Reference Intake (recommended amount) for adult males is 38 grams per day. For adult females, it is 25 grams per day. Pregnant and lactating women should consume 28 grams of fiber per day. Fiber is  the intact part of a plant that is not broken down during digestion. Functional fiber is fiber that has been isolated from the plant to provide a beneficial effect in the body. PURPOSE  Increase stool bulk.   Ease and regulate bowel movements.   Lower cholesterol.  INDICATIONS THAT YOU NEED MORE FIBER  Constipation and hemorrhoids.   Uncomplicated diverticulosis (intestine condition) and irritable bowel syndrome.   Weight management.   As a protective measure against hardening of the arteries  (atherosclerosis), diabetes, and cancer.   GUIDELINES FOR INCREASING FIBER IN THE DIET  Start adding fiber to the diet slowly. A gradual increase of about 5 more grams (2 slices of whole-wheat bread, 2 servings of most fruits or vegetables, or 1 bowl of high-fiber cereal) per day is best. Too rapid an increase in fiber may result in constipation, flatulence, and bloating.   Drink enough water and fluids to keep your urine clear or pale yellow. Water, juice, or caffeine-free drinks are recommended. Not drinking enough fluid may cause constipation.   Eat a variety of high-fiber foods rather than one type of fiber.   Try to increase your intake of fiber through using high-fiber foods rather than fiber pills or supplements that contain small amounts of fiber.   The goal is to change the types of food eaten. Do not supplement your present diet with high-fiber foods, but replace foods in your present diet.   INCLUDE A VARIETY OF FIBER SOURCES  Replace refined and processed grains with whole grains, canned fruits with fresh fruits, and incorporate other fiber sources. White rice, white breads, and most bakery goods contain little or no fiber.   Brown whole-grain rice, buckwheat oats, and many fruits and vegetables are all good sources of fiber. These include: broccoli, Brussels sprouts, cabbage, cauliflower, beets, sweet potatoes, white potatoes (skin on), carrots, tomatoes, eggplant, squash, berries, fresh fruits, and dried fruits.   Cereals appear to be the richest source of fiber. Cereal fiber is found in whole grains and bran. Bran is the fiber-rich outer coat of cereal grain, which is largely removed in refining. In whole-grain cereals, the bran remains. In breakfast cereals, the largest amount of fiber is found in those with "bran" in their names. The fiber content is sometimes indicated on the label.   You may need to include additional fruits and vegetables each day.   In baking, for 1 cup  white flour, you may use the following substitutions:   1 cup whole-wheat flour minus 2 tablespoons.   1/2 cup white flour plus 1/2 cup whole-wheat flour.   Low-Fat Diet BREADS, CEREALS, PASTA, RICE, DRIED PEAS, AND BEANS These products are high in carbohydrates and most are low in fat. Therefore, they can be increased in the diet as substitutes for fatty foods. They too, however, contain calories and should not be eaten in excess. Cereals can be eaten for snacks as well as for breakfast.   FRUITS AND VEGETABLES It is good to eat fruits and vegetables. Besides being sources of fiber, both are rich in vitamins and some minerals. They help you get the daily allowances of these nutrients. Fruits and vegetables can be used for snacks and desserts.  MEATS Limit lean meat, chicken, [place], and fish to no more than 6 ounces per day. Beef, Pork, and Lamb Use lean cuts of beef, pork, and lamb. Lean cuts include:  Extra-lean ground beef.  Arm roast.  Sirloin tip.  Center-cut ham.  Round steak.  Loin chops.  Rump roast.  Tenderloin.  Trim all fat off the outside of meats before cooking. It is not necessary to severely decrease the intake of red meat, but lean choices should be made. Lean meat is rich in protein and contains a highly absorbable form of iron. Premenopausal women, in particular, should avoid reducing lean red meat because this could increase the risk for low red blood cells (iron-deficiency anemia).  Chicken and [place] These are good sources of protein. The fat of poultry can be reduced by removing the skin and underlying fat layers before cooking. Chicken and [place] can be substituted for lean red meat in the diet. Poultry should not be fried or covered with high-fat sauces. Fish and Shellfish Fish is a good source of protein. Shellfish contain cholesterol, but they usually are low in saturated fatty acids. The preparation of fish is important. Like chicken and [place], they should  not be fried or covered with high-fat sauces. EGGS Egg whites contain no fat or cholesterol. They can be eaten often. Try 1 to 2 egg whites instead of whole eggs in recipes or use egg substitutes that do not contain yolk. MILK AND DAIRY PRODUCTS Use skim or 1% milk instead of 2% or whole milk. Decrease whole milk, natural, and processed cheeses. Use nonfat or low-fat (2%) cottage cheese or low-fat cheeses made from vegetable oils. Choose nonfat or low-fat (1 to 2%) yogurt. Experiment with evaporated skim milk in recipes that call for heavy cream. Substitute low-fat yogurt or low-fat cottage cheese for sour cream in dips and salad dressings. Have at least 2 servings of low-fat dairy products, such as 2 glasses of skim (or 1%) milk each day to help get your daily calcium intake. FATS AND OILS Reduce the total intake of fats, especially saturated fat. Butterfat, lard, and beef fats are high in saturated fat and cholesterol. These should be avoided as much as possible. Vegetable fats do not contain cholesterol, but certain vegetable fats, such as coconut oil, palm oil, and palm kernel oil are very high in saturated fats. These should be limited. These fats are often used in bakery goods, processed foods, popcorn, oils, and nondairy creamers. Vegetable shortenings and some peanut butters contain hydrogenated oils, which are also saturated fats. Read the labels on these foods and check for saturated vegetable oils. Unsaturated vegetable oils and fats do not raise blood cholesterol. However, they should be limited because they are fats and are high in calories. Total fat should still be limited to 30% of your daily caloric intake. Desirable liquid vegetable oils are corn oil, cottonseed oil, olive oil, canola oil, safflower oil, soybean oil, and sunflower oil. Peanut oil is not as good, but small amounts are acceptable. Buy a heart-healthy tub margarine that has no partially hydrogenated oils in the ingredients.  Mayonnaise and salad dressings often are made from unsaturated fats, but they should also be limited because of their high calorie and fat content. Seeds, nuts, peanut butter, olives, and avocados are high in fat, but the fat is mainly the unsaturated type. These foods should be limited mainly to avoid excess calories and fat. OTHER EATING TIPS Snacks  Most sweets should be limited as snacks. They tend to be rich in calories and fats, and their caloric content outweighs their nutritional value. Some good choices in snacks are graham crackers, melba toast, soda crackers, bagels (no egg), English muffins, fruits, and vegetables. These snacks are preferable to snack crackers, [place] fries, TORTILLA CHIPS, and POTATO chips. Popcorn  should be air-popped or cooked in small amounts of liquid vegetable oil. Desserts Eat fruit, low-fat yogurt, and fruit ices instead of pastries, cake, and cookies. Sherbet, angel food cake, gelatin dessert, frozen low-fat yogurt, or other frozen products that do not contain saturated fat (pure fruit juice bars, frozen ice pops) are also acceptable.  COOKING METHODS Choose those methods that use little or no fat. They include: Poaching.  Braising.  Steaming.  Grilling.  Baking.  Stir-frying.  Broiling.  Microwaving.  Foods can be cooked in a nonstick pan without added fat, or use a nonfat cooking spray in regular cookware. Limit fried foods and avoid frying in saturated fat. Add moisture to lean meats by using water, broth, cooking wines, and other nonfat or low-fat sauces along with the cooking methods mentioned above. Soups and stews should be chilled after cooking. The fat that forms on top after a few hours in the refrigerator should be skimmed off. When preparing meals, avoid using excess salt. Salt can contribute to raising blood pressure in some people.  EATING AWAY FROM HOME Order entres, potatoes, and vegetables without sauces or butter. When meat exceeds the  size of a deck of cards (3 to 4 ounces), the rest can be taken home for another meal. Choose vegetable or fruit salads and ask for low-calorie salad dressings to be served on the side. Use dressings sparingly. Limit high-fat toppings, such as bacon, crumbled eggs, cheese, sunflower seeds, and olives. Ask for heart-healthy tub margarine instead of butter.   Diverticulosis Diverticulosis is a common condition that develops when small pouches (diverticula) form in the wall of the colon. The risk of diverticulosis increases with age. It happens more often in people who eat a low-fiber diet. Most individuals with diverticulosis have no symptoms. Those individuals with symptoms usually experience belly (abdominal) pain, constipation, or loose stools (diarrhea).  HOME CARE INSTRUCTIONS  Increase the amount of fiber in your diet as directed by your caregiver or dietician. This may reduce symptoms of diverticulosis.   Drink at least 6 to 8 glasses of water each day to prevent constipation.   Try not to strain when you have a bowel movement.   Avoiding nuts and seeds to prevent complications is NOT NECESSARY.   FOODS HAVING HIGH FIBER CONTENT INCLUDE:  Fruits. Apple, peach, pear, tangerine, raisins, prunes.   Vegetables. Brussels sprouts, asparagus, broccoli, cabbage, carrot, cauliflower, romaine lettuce, spinach, summer squash, tomato, winter squash, zucchini.   Starchy Vegetables. Baked beans, kidney beans, lima beans, split peas, lentils, potatoes (with skin).   Grains. Whole wheat bread, brown rice, bran flake cereal, plain oatmeal, white rice, shredded wheat, bran muffins.    Hemorrhoids Hemorrhoids are dilated (enlarged) veins around the rectum. Sometimes clots will form in the veins. This makes them swollen and painful. These are called thrombosed hemorrhoids. Causes of hemorrhoids include:  Constipation.   Straining to have a bowel movement.   HEAVY LIFTING  HOME CARE  INSTRUCTIONS  Eat a well balanced diet and drink 6 to 8 glasses of water every day to avoid constipation. You may also use a bulk laxative.   Avoid straining to have bowel movements.   Keep anal area dry and clean.   Do not use a donut shaped pillow or sit on the toilet for long periods. This increases blood pooling and pain.   Move your bowels when your body has the urge; this will require less straining and will decrease pain and pressure.

## 2014-09-08 NOTE — Anesthesia Procedure Notes (Signed)
Procedure Name: MAC Date/Time: 09/08/2014 8:44 AM Performed by: Vista Deck Pre-anesthesia Checklist: Patient identified, Emergency Drugs available, Suction available, Timeout performed and Patient being monitored Patient Re-evaluated:Patient Re-evaluated prior to inductionOxygen Delivery Method: Non-rebreather mask

## 2014-09-08 NOTE — Op Note (Signed)
[place], [id number]   COLONOSCOPY PROCEDURE REPORT  PATIENT: [name], [name]  MR#: [id number] BIRTHDATE: 21-Sep-1960 , 53  yrs. old GENDER: female ENDOSCOPIST: Danie Binder, MD REFERRED NA:TFTDD Karie Kirks, M.D. PROCEDURE DATE:  09-24-14 PROCEDURE:   Colonoscopy with biopsy INDICATIONS:change in bowel habits. MEDICATIONS: Monitored anesthesia care  DESCRIPTION OF PROCEDURE:    Physical exam was performed.  Informed consent was obtained from the patient after explaining the benefits, risks, and alternatives to procedure.  The patient was connected to monitor and placed in left lateral position. Continuous oxygen was provided by nasal cannula and IV medicine administered through an indwelling cannula.  After administration of sedation and rectal exam, the patients rectum was intubated and the     colonoscope was advanced under direct visualization to the ileum.  The scope was removed slowly by carefully examining the color, texture, anatomy, and integrity mucosa on the way out.  The patient was recovered in endoscopy and discharged home in satisfactory condition. Estimated blood loss is zero unless otherwise noted in this procedure report.    COLON FINDINGS: The examined terminal ileum appeared to be normal. , The colon was redundant.  , There was mild diverticulosis noted in the sigmoid colon and descending colon with associated tortuosity.  , and Small internal hemorrhoids were found.  PREP QUALITY: excellent.  CECAL W/D TIME: 13       minutes COMPLICATIONS: None  ENDOSCOPIC IMPRESSION: 1.   NO OBVIOUS SOURCE FOR LOOSE STOOLS IDENTIFIED 2.   The LEFT colon IS SLIGHTLY redundant 3.   Mild diverticulosis in the sigmoid colon and descending colon 4.   Small internal hemorrhoids  RECOMMENDATIONS: CONTINUE WEIGHT LOSS EFFORTS. Follow a HIGH FIBER/LOW FAT DIET. USE PREPARATION H FOUR TIMES A DAY IF needed for RECTAL BLEEDING  OR PAIN. AWAIT BIOPSY. Next colonoscopy in 10 years.    eSigned:  Danie Binder, MD Sep 24, 2014 9:40 AM   CPT CODES: ICD CODES:  The ICD and CPT codes recommended by this software are interpretations from the data that the clinical staff has captured with the software.  The verification of the translation of this report to the ICD and CPT codes and modifiers is the sole responsibility of the health care institution and practicing physician where this report was generated.  [employer] will not be held responsible for the validity of the ICD and CPT codes included on this report.  AMA assumes no liability for data contained or not contained herein. [employer] is a Designer, television/film set of the [employer].

## 2014-09-08 NOTE — Anesthesia Postprocedure Evaluation (Signed)
  Anesthesia Post-op Note  Patient: [name]  Procedure(s) Performed: Procedure(s): COLONOSCOPY WITH PROPOFOL; in cecum at 0904; withdrawal time 16 minutes (N/A) BIOPSY random colon (N/A)  Patient Location: PACU  Anesthesia Type:MAC  Level of Consciousness: awake and alert   Airway and Oxygen Therapy: Patient Spontanous Breathing and Patient connected to nasal cannula oxygen  Post-op Pain: none  Post-op Assessment: Post-op Vital signs reviewed, Patient's Cardiovascular Status Stable, Respiratory Function Stable and Patent Airway              Post-op Vital Signs: Reviewed and stable   Complications: No apparent anesthesia complications

## 2014-09-08 NOTE — Progress Notes (Signed)
REVIEWED-NO ADDITIONAL RECOMMENDATIONS. 

## 2014-09-08 NOTE — Anesthesia Preprocedure Evaluation (Signed)
Anesthesia Evaluation  Patient identified by MRN, date of birth, ID band Patient awake    Reviewed: Allergy & Precautions, NPO status , Patient's Chart, lab work & pertinent test results  Airway Mallampati: II  TM Distance: >3 FB     Dental  (+) Teeth Intact, Partial Upper   Pulmonary asthma , former smoker,  breath sounds clear to auscultation        Cardiovascular hypertension, Pt. on medications Rhythm:Regular Rate:Normal     Neuro/Psych PSYCHIATRIC DISORDERS Anxiety Depression    GI/Hepatic negative GI ROS,   Endo/Other    Renal/GU      Musculoskeletal   Abdominal   Peds  Hematology   Anesthesia Other Findings   Reproductive/Obstetrics                             Anesthesia Physical Anesthesia Plan  ASA: III  Anesthesia Plan: MAC   Post-op Pain Management:    Induction: Intravenous  Airway Management Planned: Simple Face Mask  Additional Equipment:   Intra-op Plan:   Post-operative Plan:   Informed Consent: I have reviewed the patients History and Physical, chart, labs and discussed the procedure including the risks, benefits and alternatives for the proposed anesthesia with the patient or authorized representative who has indicated his/her understanding and acceptance.     Plan Discussed with:   Anesthesia Plan Comments:         Anesthesia Quick Evaluation

## 2014-09-08 NOTE — H&P (Signed)
Primary Care Physician:  Robert Bellow, MD Primary Gastroenterologist:  Dr. Oneida Alar  Pre-Procedure History & Physical: HPI:  [name] is a 54 y.o. female here for Change in bowel habits.  Past Medical History  Diagnosis Date  . Anxiety   . Hypertension   . Cough   . Visual disturbance   . Wheezing   . Palpitation   . Diarrhea   . Cramps, muscle, general   . Breast cancer 2009    Tamoxifen, left lumpectomy  . Asthma   . Depression     Past Surgical History  Procedure Laterality Date  . Abdominal hysterectomy  09/2005    still have ovaries  . Breast surgery Left 09/2007    lumpectomy    Prior to Admission medications   Medication Sig Start Date End Date Taking? Authorizing Provider  Cholecalciferol (VITAMIN D-3) 1000 UNITS CAPS Take 1,000 Units by mouth daily.    Yes Historical Provider, MD  Chromium-Cinnamon (CINNAMON PLUS CHROMIUM) [id number] MCG-MG CAPS Take 1 tablet by mouth daily.   Yes Historical Provider, MD  hydrochlorothiazide (HYDRODIURIL) 25 MG tablet Take 25 mg by mouth daily.     Yes Historical Provider, MD  hyoscyamine (LEVSIN SL) 0.125 MG SL tablet Place 1 tablet (0.125 mg total) under the tongue every 4 (four) hours as needed. 08/21/14  Yes Orvil Feil, NP  LORazepam (ATIVAN) 0.5 MG tablet Take 0.5 mg by mouth every 8 (eight) hours as needed for anxiety.    Yes Historical Provider, MD  Methylsulfonylmethane (MSM PO) Take 4,500 mg by mouth daily.   Yes Historical Provider, MD  Nutritional Supplements (GRAPESEED EXTRACT PO) Take by mouth daily.   Yes Historical Provider, MD  Omega 3 1000 MG CAPS Take 1 capsule by mouth daily.   Yes Historical Provider, MD  peg 3350 powder (MOVIPREP) 100 G SOLR Take 1 kit (200 g total) by mouth as directed. 09/07/14  Yes Daneil Dolin, MD  potassium chloride SA (K-DUR,KLOR-CON) 20 MEQ tablet Take 20 mEq by mouth daily.   Yes Historical Provider, MD  venlafaxine (EFFEXOR) 50 MG tablet Take 50 mg by mouth 2 (two) times  daily.     Yes Historical Provider, MD  vitamin B-12 (CYANOCOBALAMIN) 1000 MCG tablet Take 1,000 mcg by mouth daily.   Yes Historical Provider, MD    Allergies as of 08/21/2014 - Review Complete 08/21/2014  Allergen Reaction Noted  . Ceclor [cefaclor] Other (See Comments) 02/08/2011  . Erythromycin Hives and Itching 12/10/2009  . Penicillins Hives and Itching 12/10/2009  . Tetracycline Hives and Itching 12/10/2009  . Other Itching 08/21/2014    Family History  Problem Relation Age of Onset  . Diabetes Mother   . Stroke Mother   . Hypertension Mother   . Diabetes Father   . Hypertension Sister   . Hypertension Brother   . Cancer Paternal Aunt     ovarian  . Cancer Cousin     colon  . Colon cancer      no first degree relatives    History   Social History  . Marital Status: Divorced    Spouse Name: N/A  . Number of Children: N/A  . Years of Education: N/A   Occupational History  . Social Services    Social History Main Topics  . Smoking status: Former Smoker    Quit date: 03/02/1990  . Smokeless tobacco: Never Used  . Alcohol Use: No  . Drug Use: No  . Sexual Activity: Yes  Other Topics Concern  . Not on file   Social History Narrative    Review of Systems: See HPI, otherwise negative ROS   Physical Exam: BP 130/83 mmHg  Pulse 84  Temp(Src) 98.2 F (36.8 C) (Oral)  Resp 19  Ht '5\' 9"'  (1.753 m)  Wt 269 lb (122.018 kg)  BMI 39.71 kg/m2  SpO2 96% General:   Alert,  pleasant and cooperative in NAD Head:  Normocephalic and atraumatic. Neck:  Supple; Lungs:  Clear throughout to auscultation.    Heart:  Regular rate and rhythm. Abdomen:  Soft, nontender and nondistended. Normal bowel sounds, without guarding, and without rebound.   Neurologic:  Alert and  oriented x4;  grossly normal neurologically.  Impression/Plan:   Change in bowel habits  PLAN:  1. TCS bX TODAY

## 2014-09-08 NOTE — Transfer of Care (Signed)
Immediate Anesthesia Transfer of Care Note  Patient: [name]  Procedure(s) Performed: Procedure(s): COLONOSCOPY WITH PROPOFOL; in cecum at 0904; withdrawal time 16 minutes (N/A) BIOPSY random colon (N/A)  Patient Location: PACU  Anesthesia Type:MAC  Level of Consciousness: awake and patient cooperative  Airway & Oxygen Therapy: Patient Spontanous Breathing and Patient connected to nasal cannula oxygen  Post-op Assessment: Report given to RN, Post -op Vital signs reviewed and stable and Patient moving all extremities  Post vital signs: Reviewed and stable    Complications: No apparent anesthesia complications

## 2014-09-09 ENCOUNTER — Encounter (HOSPITAL_COMMUNITY): Payer: Self-pay | Admitting: Gastroenterology

## 2014-09-24 ENCOUNTER — Telehealth: Payer: Self-pay | Admitting: Gastroenterology

## 2014-09-24 NOTE — Telephone Encounter (Signed)
Called and VM not set up. Mailing a letter to call.

## 2014-09-24 NOTE — Telephone Encounter (Signed)
REMINDERS IN EPIC °

## 2014-09-24 NOTE — Telephone Encounter (Signed)
Please call pt. Her colon bowel biopsies are normal.    CONTINUE YOUR WEIGHT LOSS EFFORTS. OBESITY IS ASSOCIATED WITH AN INCREASE RISK OF ALL CANCERS, ESPECIALLY COLON CANCER.  Follow a HIGH FIBER/LOW FAT DIET. AVOID ITEMS THAT CAUSE BLOATING. USE PREPARATION H FOUR TIMES A DAY FOR 7 DAYS IF YOU HAVE RECTAL BLEEDING OR PAIN.   FOLLOW UP IN 6 MOS IF LOOSE STOOLS CONTINUE.   Next colonoscopy in 10 years.

## 2014-09-24 NOTE — Telephone Encounter (Signed)
Pt called office back and she is aware of results

## 2015-01-08 ENCOUNTER — Telehealth: Payer: Self-pay | Admitting: Hematology and Oncology

## 2015-01-08 NOTE — Telephone Encounter (Signed)
s.w. pt and confirmed appt....pt ok and aware °

## 2015-02-24 ENCOUNTER — Encounter: Payer: Self-pay | Admitting: Gastroenterology

## 2015-03-04 ENCOUNTER — Ambulatory Visit: Payer: BC Managed Care – PPO | Admitting: Hematology and Oncology

## 2015-03-04 NOTE — Assessment & Plan Note (Deleted)
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  Breast cancer surveillance: 1. Breast exam done 03/04/2015 no palpable lumps or nodules. 2. Mammogram 09/08/2014 postsurgical changes. Breast density category C  Return to clinic once a year with mammograms and follow-up with survivorship clinic.

## 2015-08-19 ENCOUNTER — Other Ambulatory Visit (HOSPITAL_COMMUNITY): Payer: Self-pay | Admitting: Family Medicine

## 2015-08-19 DIAGNOSIS — Z1231 Encounter for screening mammogram for malignant neoplasm of breast: Secondary | ICD-10-CM

## 2015-09-06 ENCOUNTER — Other Ambulatory Visit (HOSPITAL_COMMUNITY): Payer: Self-pay | Admitting: Family Medicine

## 2015-09-06 DIAGNOSIS — R1011 Right upper quadrant pain: Secondary | ICD-10-CM

## 2015-09-10 ENCOUNTER — Ambulatory Visit (HOSPITAL_COMMUNITY)
Admission: RE | Admit: 2015-09-10 | Discharge: 2015-09-10 | Disposition: A | Discharge status: Home / Self Care | Payer: BLUE CROSS/BLUE SHIELD | Source: Ambulatory Visit | Attending: Family Medicine | Admitting: Family Medicine

## 2015-09-10 DIAGNOSIS — Z1231 Encounter for screening mammogram for malignant neoplasm of breast: Secondary | ICD-10-CM | POA: Diagnosis present

## 2015-09-16 ENCOUNTER — Ambulatory Visit (HOSPITAL_COMMUNITY)
Admission: RE | Admit: 2015-09-16 | Discharge: 2015-09-16 | Disposition: A | Discharge status: Home / Self Care | Payer: BLUE CROSS/BLUE SHIELD | Source: Ambulatory Visit | Attending: Family Medicine | Admitting: Family Medicine

## 2015-09-16 DIAGNOSIS — R1031 Right lower quadrant pain: Secondary | ICD-10-CM | POA: Insufficient documentation

## 2015-09-16 DIAGNOSIS — R160 Hepatomegaly, not elsewhere classified: Secondary | ICD-10-CM | POA: Diagnosis not present

## 2015-09-16 DIAGNOSIS — R1011 Right upper quadrant pain: Secondary | ICD-10-CM

## 2016-10-06 ENCOUNTER — Other Ambulatory Visit (HOSPITAL_COMMUNITY): Payer: Self-pay | Admitting: Family Medicine

## 2016-10-06 DIAGNOSIS — Z1231 Encounter for screening mammogram for malignant neoplasm of breast: Secondary | ICD-10-CM

## 2016-10-18 ENCOUNTER — Ambulatory Visit (HOSPITAL_COMMUNITY): Payer: BLUE CROSS/BLUE SHIELD

## 2016-10-23 ENCOUNTER — Ambulatory Visit (HOSPITAL_COMMUNITY)
Admission: RE | Admit: 2016-10-23 | Discharge: 2016-10-23 | Disposition: A | Discharge status: Home / Self Care | Payer: Commercial Managed Care - PPO | Source: Ambulatory Visit | Attending: Family Medicine | Admitting: Family Medicine

## 2016-10-23 DIAGNOSIS — Z1231 Encounter for screening mammogram for malignant neoplasm of breast: Secondary | ICD-10-CM | POA: Insufficient documentation

## 2016-11-02 ENCOUNTER — Encounter: Payer: Self-pay | Admitting: Cardiology

## 2016-11-15 ENCOUNTER — Encounter: Payer: Self-pay | Admitting: Cardiology

## 2016-12-07 ENCOUNTER — Ambulatory Visit: Payer: BLUE CROSS/BLUE SHIELD | Admitting: Cardiovascular Disease

## 2016-12-15 NOTE — Progress Notes (Deleted)
Cardiology Office Note   Date:  12/15/2016   ID:  [name], [name], [name]  PCP:  Lemmie Evens, MD  Cardiologist:   Jenkins Rouge, MD   No chief complaint on file.     History of Present Illness: [name] is a 56 y.o. female who presents for consultation regarding chest pain. Referred by Dr Karie Kirks History of HTN, Aniety, palpitations and Asthma Quit smoking in 1991 ***    Past Medical History:  Diagnosis Date  . Anxiety   . Asthma   . Breast cancer 2009   Tamoxifen, left lumpectomy  . Cough   . Cramps, muscle, general   . Depression   . Diarrhea   . Hypertension   . Palpitation   . Visual disturbance   . Wheezing     Past Surgical History:  Procedure Laterality Date  . ABDOMINAL HYSTERECTOMY  09/2005   still have ovaries  . BIOPSY N/A 09/08/2014   Procedure: BIOPSY random colon;  Surgeon: Danie Binder, MD;  Location: [facility];  Service: Endoscopy;  Laterality: N/A;  . BREAST SURGERY Left 09/2007   lumpectomy  . COLONOSCOPY WITH PROPOFOL N/A 09/08/2014   Procedure: COLONOSCOPY WITH PROPOFOL; in cecum at 0904; withdrawal time 16 minutes;  Surgeon: Danie Binder, MD;  Location: [facility];  Service: Endoscopy;  Laterality: N/A;     Current Outpatient Prescriptions  Medication Sig Dispense Refill  . Cholecalciferol (VITAMIN D-3) 1000 UNITS CAPS Take 1,000 Units by mouth daily.     . Chromium-Cinnamon (CINNAMON PLUS CHROMIUM) [phone] MCG-MG CAPS Take 1 tablet by mouth daily.    . hydrochlorothiazide (HYDRODIURIL) 25 MG tablet Take 25 mg by mouth daily.      . hyoscyamine (LEVSIN SL) 0.125 MG SL tablet Place 1 tablet (0.125 mg total) under the tongue every 4 (four) hours as needed. 30 tablet 3  . LORazepam (ATIVAN) 0.5 MG tablet Take 0.5 mg by mouth every 8 (eight) hours as needed for anxiety.     . Methylsulfonylmethane (MSM PO) Take 4,500 mg by mouth daily.    . Nutritional Supplements (GRAPESEED EXTRACT PO) Take by mouth daily.     . Omega 3 1000 MG CAPS Take 1 capsule by mouth daily.    . peg 3350 powder (MOVIPREP) 100 G SOLR Take 1 kit (200 g total) by mouth as directed. 1 kit 0  . potassium chloride SA (K-DUR,KLOR-CON) 20 MEQ tablet Take 20 mEq by mouth daily.    Marland Kitchen venlafaxine (EFFEXOR) 50 MG tablet Take 50 mg by mouth 2 (two) times daily.      . vitamin B-12 (CYANOCOBALAMIN) 1000 MCG tablet Take 1,000 mcg by mouth daily.     No current facility-administered medications for this visit.     Allergies:   Ceclor [cefaclor]; Erythromycin; Latex; Penicillins; Tetracycline; Other; and Pineapple    Social History:  The patient  reports that she quit smoking about 26 years ago. She has never used smokeless tobacco. She reports that she does not drink alcohol or use drugs.   Family History:  The patient's family history includes Cancer in her cousin and paternal aunt; Colon cancer in her unknown relative; Diabetes in her father and mother; Hypertension in her brother, mother, and sister; Stroke in her mother.    ROS:  Please see the history of present illness.   Otherwise, review of systems are positive for {NONE DEFAULTED:18576::"none"}.   All other systems are reviewed and negative.  PHYSICAL EXAM: VS:  There were no vitals taken for this visit. , BMI There is no height or weight on file to calculate BMI. Affect appropriate Healthy:  appears stated age 22: normal Neck supple with no adenopathy JVP normal no bruits no thyromegaly Lungs clear with no wheezing and good diaphragmatic motion Heart:  S1/S2 no murmur, no rub, gallop or click PMI normal Abdomen: benighn, BS positve, no tenderness, no AAA no bruit.  No HSM or HJR Distal pulses intact with no bruits No edema Neuro non-focal Skin warm and dry No muscular weakness    EKG:  2011 SR rate 84 poor R wave progression otherwise normal    Recent Labs: No results found for requested labs within last 8760 hours.    Lipid Panel No results found  for: CHOL, TRIG, HDL, CHOLHDL, VLDL, LDLCALC, LDLDIRECT    Wt Readings from Last 3 Encounters:  09/08/14 269 lb (122 kg)  09/04/14 269 lb (122 kg)  08/21/14 269 lb 12.8 oz (122.4 kg)      Other studies Reviewed: Additional studies/ records that were reviewed today include: ***.    ASSESSMENT AND PLAN:  1.  Chest Pain 2. HTN 3. ***   Current medicines are reviewed at length with the patient today.  The patient {ACTIONS; HAS/DOES NOT HAVE:19233} concerns regarding medicines.  The following changes have been made:  {PLAN; NO CHANGE:13088:s}  Labs/ tests ordered today include: *** No orders of the defined types were placed in this encounter.    Disposition:   FU with ***     Signed, Jenkins Rouge, MD  12/15/2016 1:41 PM    [facility] [address], [place], [place]  [address] Phone: [phone]; Fax: [phone]

## 2016-12-19 ENCOUNTER — Ambulatory Visit: Payer: BLUE CROSS/BLUE SHIELD | Admitting: Cardiovascular Disease

## 2016-12-21 NOTE — Progress Notes (Deleted)
Cardiology Office Note   Date:  12/21/2016   ID:  Ahja, Martello June 07, 1960, MRN [id number]  PCP:  Lemmie Evens, MD  Cardiologist:   Jenkins Rouge, MD   No chief complaint on file.     History of Present Illness: [name] is a 56 y.o. female who presents for consultation regarding chest pain. Referred by Dr Karie Kirks. She takes a diuretic for HTN.  Reviewed notes from [facility] 10/25/16 Described one episode of SSCP At rest in August. Was note exerting herself Reading Pain in middle of chest to back. Radiated to left arm and felt throat Being squeezed Called 911 and paramedics did not seem alarmed. Did not go to ER Brother had MI in his 55's    Past Medical History:  Diagnosis Date  . Anxiety   . Asthma   . Breast cancer 2009   Tamoxifen, left lumpectomy  . Cough   . Cramps, muscle, general   . Depression   . Diarrhea   . Hypertension   . Palpitation   . Visual disturbance   . Wheezing     Past Surgical History:  Procedure Laterality Date  . ABDOMINAL HYSTERECTOMY  09/2005   still have ovaries  . BIOPSY N/A 09/08/2014   Procedure: BIOPSY random colon;  Surgeon: Danie Binder, MD;  Location: [facility];  Service: Endoscopy;  Laterality: N/A;  . BREAST SURGERY Left 09/2007   lumpectomy  . COLONOSCOPY WITH PROPOFOL N/A 09/08/2014   Procedure: COLONOSCOPY WITH PROPOFOL; in cecum at 0904; withdrawal time 16 minutes;  Surgeon: Danie Binder, MD;  Location: [facility];  Service: Endoscopy;  Laterality: N/A;     Current Outpatient Prescriptions  Medication Sig Dispense Refill  . Cholecalciferol (VITAMIN D-3) 1000 UNITS CAPS Take 1,000 Units by mouth daily.     . Chromium-Cinnamon (CINNAMON PLUS CHROMIUM) [phone] MCG-MG CAPS Take 1 tablet by mouth daily.    . hydrochlorothiazide (HYDRODIURIL) 25 MG tablet Take 25 mg by mouth daily.      . hyoscyamine (LEVSIN SL) 0.125 MG SL tablet Place 1 tablet (0.125 mg total) under the tongue every 4 (four) hours  as needed. 30 tablet 3  . LORazepam (ATIVAN) 0.5 MG tablet Take 0.5 mg by mouth every 8 (eight) hours as needed for anxiety.     . Methylsulfonylmethane (MSM PO) Take 4,500 mg by mouth daily.    . Nutritional Supplements (GRAPESEED EXTRACT PO) Take by mouth daily.    . Omega 3 1000 MG CAPS Take 1 capsule by mouth daily.    . peg 3350 powder (MOVIPREP) 100 G SOLR Take 1 kit (200 g total) by mouth as directed. 1 kit 0  . potassium chloride SA (K-DUR,KLOR-CON) 20 MEQ tablet Take 20 mEq by mouth daily.    Marland Kitchen venlafaxine (EFFEXOR) 50 MG tablet Take 50 mg by mouth 2 (two) times daily.      . vitamin B-12 (CYANOCOBALAMIN) 1000 MCG tablet Take 1,000 mcg by mouth daily.     No current facility-administered medications for this visit.     Allergies:   Ceclor [cefaclor]; Erythromycin; Latex; Penicillins; Tetracycline; Other; and Pineapple    Social History:  The patient  reports that she quit smoking about 26 years ago. She has never used smokeless tobacco. She reports that she does not drink alcohol or use drugs.   Family History:  The patient's family history includes Cancer in her cousin and paternal aunt; Colon cancer in her unknown relative;  Diabetes in her father and mother; Hypertension in her brother, mother, and sister; Stroke in her mother.    ROS:  Please see the history of present illness.   Otherwise, review of systems are positive for none.   All other systems are reviewed and negative.    PHYSICAL EXAM: VS:  There were no vitals taken for this visit. , BMI There is no height or weight on file to calculate BMI. Affect appropriate Healthy:  appears stated age HEENT: normal Neck supple with no adenopathy JVP normal no bruits no thyromegaly Lungs clear with no wheezing and good diaphragmatic motion Heart:  S1/S2 no murmur, no rub, gallop or click PMI normal Abdomen: benighn, BS positve, no tenderness, no AAA no bruit.  No HSM or HJR Distal pulses intact with no bruits No  edema Neuro non-focal Skin warm and dry No muscular weakness    EKG:  12/13/09 SR rate 64 normal    Recent Labs: No results found for requested labs within last 8760 hours.    Lipid Panel No results found for: CHOL, TRIG, HDL, CHOLHDL, VLDL, LDLCALC, LDLDIRECT    Wt Readings from Last 3 Encounters:  09/08/14 269 lb (122 kg)  09/04/14 269 lb (122 kg)  08/21/14 269 lb 12.8 oz (122.4 kg)      Other studies Reviewed: Additional studies/ records that were reviewed today include: Notes [facility] .    ASSESSMENT AND PLAN:  1.  Chest Pain 2. HTN 3. Obesity   Current medicines are reviewed at length with the patient today.  The patient does not have concerns regarding medicines.  The following changes have been made:  no change  Labs/ tests ordered today include: *** No orders of the defined types were placed in this encounter.    Disposition:   FU with cardiology PRN      Signed, Peter Nishan, MD  12/21/2016 8:40 AM    [facility] [address], [place], [place]  [address] Phone: [phone]; Fax: [phone]  

## 2016-12-22 ENCOUNTER — Ambulatory Visit: Payer: BLUE CROSS/BLUE SHIELD | Admitting: Cardiovascular Disease

## 2016-12-22 ENCOUNTER — Encounter: Payer: Self-pay | Admitting: Cardiovascular Disease

## 2019-09-10 ENCOUNTER — Ambulatory Visit
Admission: EM | Admit: 2019-09-10 | Discharge: 2019-09-10 | Disposition: A | Discharge status: Home / Self Care | Payer: BLUE CROSS/BLUE SHIELD | Source: Home / Self Care

## 2019-09-10 ENCOUNTER — Encounter (HOSPITAL_COMMUNITY): Payer: Self-pay

## 2019-09-10 ENCOUNTER — Emergency Department (HOSPITAL_COMMUNITY): Payer: BLUE CROSS/BLUE SHIELD

## 2019-09-10 ENCOUNTER — Emergency Department (HOSPITAL_COMMUNITY)
Admission: EM | Admit: 2019-09-10 | Discharge: 2019-09-10 | Disposition: A | Discharge status: Home / Self Care | Payer: BLUE CROSS/BLUE SHIELD | Attending: Emergency Medicine | Admitting: Emergency Medicine

## 2019-09-10 ENCOUNTER — Other Ambulatory Visit: Payer: Self-pay

## 2019-09-10 DIAGNOSIS — Z87891 Personal history of nicotine dependence: Secondary | ICD-10-CM | POA: Diagnosis not present

## 2019-09-10 DIAGNOSIS — I10 Essential (primary) hypertension: Secondary | ICD-10-CM | POA: Insufficient documentation

## 2019-09-10 DIAGNOSIS — J45909 Unspecified asthma, uncomplicated: Secondary | ICD-10-CM | POA: Insufficient documentation

## 2019-09-10 DIAGNOSIS — R110 Nausea: Secondary | ICD-10-CM | POA: Insufficient documentation

## 2019-09-10 DIAGNOSIS — R1032 Left lower quadrant pain: Secondary | ICD-10-CM | POA: Insufficient documentation

## 2019-09-10 DIAGNOSIS — Z91040 Latex allergy status: Secondary | ICD-10-CM | POA: Insufficient documentation

## 2019-09-10 DIAGNOSIS — Z800 Family history of malignant neoplasm of digestive organs: Secondary | ICD-10-CM | POA: Insufficient documentation

## 2019-09-10 DIAGNOSIS — Z79899 Other long term (current) drug therapy: Secondary | ICD-10-CM | POA: Diagnosis not present

## 2019-09-10 DIAGNOSIS — C50512 Malignant neoplasm of lower-outer quadrant of left female breast: Secondary | ICD-10-CM | POA: Insufficient documentation

## 2019-09-10 DIAGNOSIS — R109 Unspecified abdominal pain: Secondary | ICD-10-CM | POA: Diagnosis present

## 2019-09-10 DIAGNOSIS — K5792 Diverticulitis of intestine, part unspecified, without perforation or abscess without bleeding: Secondary | ICD-10-CM

## 2019-09-10 DIAGNOSIS — R509 Fever, unspecified: Secondary | ICD-10-CM | POA: Insufficient documentation

## 2019-09-10 DIAGNOSIS — K5732 Diverticulitis of large intestine without perforation or abscess without bleeding: Secondary | ICD-10-CM | POA: Diagnosis not present

## 2019-09-10 LAB — LIPASE, BLOOD: Lipase: 24 U/L (ref 11–51)

## 2019-09-10 LAB — COMPREHENSIVE METABOLIC PANEL
ALT: 24 U/L (ref 0–44)
AST: 20 U/L (ref 15–41)
Albumin: 3.9 g/dL (ref 3.5–5.0)
Alkaline Phosphatase: 87 U/L (ref 38–126)
Anion gap: 11 (ref 5–15)
BUN: 14 mg/dL (ref 6–20)
CO2: 28 mmol/L (ref 22–32)
Calcium: 8.9 mg/dL (ref 8.9–10.3)
Chloride: 102 mmol/L (ref 98–111)
Creatinine, Ser: 0.71 mg/dL (ref 0.44–1.00)
GFR calc Af Amer: >60 mL/min (ref >60)
GFR calc non Af Amer: >60 mL/min (ref >60)
Glucose, Bld: 100 mg/dL — ABNORMAL HIGH (ref 70–99)
Potassium: 3.4 mmol/L — ABNORMAL LOW (ref 3.5–5.1)
Sodium: 141 mmol/L (ref 135–145)
Total Bilirubin: 0.5 mg/dL (ref 0.3–1.2)
Total Protein: 7.5 g/dL (ref 6.5–8.1)

## 2019-09-10 LAB — CBC
HCT: 41.6 % (ref 36.0–46.0)
Hemoglobin: 13.1 g/dL (ref 12.0–15.0)
MCH: 27.8 pg (ref 26.0–34.0)
MCHC: 31.5 g/dL (ref 30.0–36.0)
MCV: 88.1 fL (ref 80.0–100.0)
Platelets: 279 10*3/uL (ref 150–400)
RBC: 4.72 MIL/uL (ref 3.87–5.11)
RDW: 13.2 % (ref 11.5–15.5)
WBC: 5.4 10*3/uL (ref 4.0–10.5)
nRBC: 0 % (ref 0.0–0.2)

## 2019-09-10 LAB — URINALYSIS, ROUTINE W REFLEX MICROSCOPIC
Bilirubin Urine: NEGATIVE
Glucose, UA: NEGATIVE mg/dL
Hgb urine dipstick: NEGATIVE
Ketones, ur: NEGATIVE mg/dL
Leukocytes,Ua: NEGATIVE
Nitrite: NEGATIVE
Protein, ur: NEGATIVE mg/dL
Specific Gravity, Urine: 1.029 (ref 1.005–1.030)
pH: 5 (ref 5.0–8.0)

## 2019-09-10 MED ORDER — METRONIDAZOLE 500 MG PO TABS
500.0000 mg | ORAL_TABLET | Freq: Once | ORAL | Status: AC
  Administered 2019-09-10: 500 mg via ORAL
  Filled 2019-09-10: qty 1

## 2019-09-10 MED ORDER — HYDROCODONE-ACETAMINOPHEN 5-325 MG PO TABS: 1.0000 | ORAL_TABLET | ORAL | 0 refills | Status: DC | PRN

## 2019-09-10 MED ORDER — CIPROFLOXACIN HCL 250 MG PO TABS
500.0000 mg | ORAL_TABLET | Freq: Once | ORAL | Status: AC
  Administered 2019-09-10: 500 mg via ORAL
  Filled 2019-09-10: qty 2

## 2019-09-10 MED ORDER — METRONIDAZOLE 500 MG PO TABS: 500.0000 mg | ORAL_TABLET | Freq: Three times a day (TID) | ORAL | 0 refills | Status: DC

## 2019-09-10 MED ORDER — IOHEXOL 300 MG/ML  SOLN
100.0000 mL | Freq: Once | INTRAMUSCULAR | Status: AC | PRN
  Administered 2019-09-10: 100 mL via INTRAVENOUS

## 2019-09-10 MED ORDER — CIPROFLOXACIN HCL 500 MG PO TABS: 500.0000 mg | ORAL_TABLET | Freq: Two times a day (BID) | ORAL | 0 refills | Status: DC

## 2019-09-10 MED ORDER — DOCUSATE SODIUM 100 MG PO CAPS: 100.0000 mg | ORAL_CAPSULE | Freq: Two times a day (BID) | ORAL | 0 refills | Status: DC

## 2019-09-10 MED ORDER — ONDANSETRON 4 MG PO TBDP: 4.0000 mg | ORAL_TABLET | Freq: Three times a day (TID) | ORAL | 0 refills | Status: DC | PRN

## 2019-09-10 MED ORDER — SODIUM CHLORIDE 0.9% FLUSH: 3.0000 mL | Freq: Once | INTRAVENOUS | Status: DC

## 2019-09-10 NOTE — ED Provider Notes (Signed)
[facility] EMERGENCY DEPARTMENT Provider Note   CSN: [id number] Arrival date & time: 09/10/19  1154     History Chief Complaint  Patient presents with  . Abdominal Pain    [name] is a 59 y.o. female with a past medical history significant for breast cancer with a lumpectomy on tamoxifen, hypertension, surgical history significant for abdominal hysterectomy presenting with an approximate 2-week history of intermittent abdominal bloating and pain.  She also endorses having constipation 2 days ago which is very unusual for her, stating she normally has very regular bowel movements.  This weekend she endorses having a fever to 101.  In addition she has had nausea without emesis and has had reduced p.o. intake.  She denies dysuria or hematuria, denies back or flank pain.  She mentions that with her last colonoscopy she was told she has diverticulosis.  Her pain is described as aching and is in her left lower abdomen and radiates to her midline.  She has had no medications for her symptoms and has found no alleviators.  Her pain is worsened with movement.  HPI     Past Medical History:  Diagnosis Date  . Anxiety   . Asthma   . Breast cancer ([employer]) 2009   Tamoxifen, left lumpectomy  . Cough   . Cramps, muscle, general   . Depression   . Diarrhea   . Hypertension   . Palpitation   . Visual disturbance   . Wheezing     Patient Active Problem List   Diagnosis Date Noted  . Encounter for screening colonoscopy 08/21/2014  . Diarrhea 08/21/2014  . 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 Breast cancer of lower-outer quadrant of left female breast ([place]) 12/13/2009  . ANXIETY 12/13/2009  . CHEST PAIN 12/13/2009    Past Surgical History:  Procedure Laterality Date  . ABDOMINAL HYSTERECTOMY  09/2005   still have ovaries  . BIOPSY N/A 09/08/2014   Procedure: BIOPSY random colon;  Surgeon: Danie Binder, MD;  Location: [facility];  Service: Endoscopy;  Laterality: N/A;  .  BREAST SURGERY Left 09/2007   lumpectomy  . COLONOSCOPY WITH PROPOFOL N/A 09/08/2014   Procedure: COLONOSCOPY WITH PROPOFOL; in cecum at 0904; withdrawal time 16 minutes;  Surgeon: Danie Binder, MD;  Location: [facility];  Service: Endoscopy;  Laterality: N/A;     OB History   No obstetric history on file.     Family History  Problem Relation Age of Onset  . Diabetes Mother   . Stroke Mother   . Hypertension Mother   . Diabetes Father   . Cancer Paternal Aunt        ovarian  . Hypertension Sister   . Hypertension Brother   . Cancer Cousin        colon  . Colon cancer Other        no first degree relatives    Social History   Tobacco Use  . Smoking status: Former Smoker    Quit date: 03/02/1990    Years since quitting: 29.5  . Smokeless tobacco: Never Used  Substance Use Topics  . Alcohol use: No    Alcohol/week: 0.0 standard drinks  . Drug use: No    Home Medications Prior to Admission medications   Medication Sig Start Date End Date Taking? Authorizing Provider  Cholecalciferol (VITAMIN D-3) 1000 UNITS CAPS Take 1,000 Units by mouth daily.     [provider]  Chromium-Cinnamon (CINNAMON PLUS CHROMIUM) [phone]  MCG-MG CAPS Take 1 tablet by mouth daily.    [provider]  ciprofloxacin (CIPRO) 500 MG tablet Take 1 tablet (500 mg total) by mouth every 12 (twelve) hours. 09/10/19   Evalee Jefferson, PA-C  docusate sodium (COLACE) 100 MG capsule Take 1 capsule (100 mg total) by mouth every 12 (twelve) hours. 09/10/19   Evalee Jefferson, PA-C  hydrochlorothiazide (HYDRODIURIL) 25 MG tablet Take 25 mg by mouth daily.      [provider]  HYDROcodone-acetaminophen (NORCO/VICODIN) 5-325 MG tablet Take 1 tablet by mouth every 4 (four) hours as needed for moderate pain. 09/10/19   Evalee Jefferson, PA-C  hyoscyamine (LEVSIN SL) 0.125 MG SL tablet Place 1 tablet (0.125 mg total) under the tongue every 4 (four) hours as needed. 08/21/14   Annitta Needs, NP  LORazepam  (ATIVAN) 0.5 MG tablet Take 0.5 mg by mouth every 8 (eight) hours as needed for anxiety.     [provider]  Methylsulfonylmethane (MSM PO) Take 4,500 mg by mouth daily.    [provider]  metroNIDAZOLE (FLAGYL) 500 MG tablet Take 1 tablet (500 mg total) by mouth 3 (three) times daily for 7 days. 09/10/19 09/17/19  Evalee [place], PA-C  Nutritional Supplements (GRAPESEED EXTRACT PO) Take by mouth daily.    [provider]  Omega 3 1000 MG CAPS Take 1 capsule by mouth daily.    [provider]  ondansetron (ZOFRAN ODT) 4 MG disintegrating tablet Take 1 tablet (4 mg total) by mouth every 8 (eight) hours as needed for nausea or vomiting. 09/10/19   Tyrisha Benninger, Almyra Free, PA-C  peg 3350 powder (MOVIPREP) 100 G SOLR Take 1 kit (200 g total) by mouth as directed. 09/07/14   Rourk, Cristopher Estimable, MD  potassium chloride SA (K-DUR,KLOR-CON) 20 MEQ tablet Take 20 mEq by mouth daily.    [provider]  venlafaxine (EFFEXOR) 50 MG tablet Take 50 mg by mouth 2 (two) times daily.      [provider]  vitamin B-12 (CYANOCOBALAMIN) 1000 MCG tablet Take 1,000 mcg by mouth daily.    [provider]    Allergies    Ceclor [cefaclor], Erythromycin, Latex, Penicillins, Tetracycline, Other, and Pineapple  Review of Systems   Review of Systems  Constitutional: Positive for appetite change and fever.  HENT: Negative for congestion and sore throat.   Eyes: Negative.   Respiratory: Negative for chest tightness and shortness of breath.   Cardiovascular: Negative for chest pain.  Gastrointestinal: Positive for abdominal distention, abdominal pain and nausea. Negative for diarrhea and vomiting.  Genitourinary: Negative.  Negative for dysuria.  Musculoskeletal: Negative for arthralgias, joint swelling and neck pain.  Skin: Negative.  Negative for rash and wound.  Neurological: Negative for dizziness, weakness, light-headedness, numbness and headaches.  Psychiatric/Behavioral:  Negative.     Physical Exam Updated Vital Signs BP 122/89   Pulse 82   Temp 98.7 F (37.1 C) (Oral)   Resp 16   SpO2 100%   Physical Exam Vitals and nursing note reviewed.  Constitutional:      Appearance: She is well-developed.  HENT:     Head: Normocephalic and atraumatic.  Eyes:     Conjunctiva/sclera: Conjunctivae normal.  Cardiovascular:     Rate and Rhythm: Normal rate and regular rhythm.     Heart sounds: Normal heart sounds.  Pulmonary:     Effort: Pulmonary effort is normal.     Breath sounds: Normal breath sounds. No wheezing.  Abdominal:  General: Abdomen is protuberant. Bowel sounds are normal. There is no distension.     Palpations: Abdomen is soft.     Tenderness: There is abdominal tenderness in the suprapubic area and left lower quadrant. Negative signs include McBurney's sign.     Comments: No increased tympany to percussion.  Abdomen is soft without guarding.  She has tenderness in her left lower quadrant without rebound guarding.  Musculoskeletal:        General: Normal range of motion.     Cervical back: Normal range of motion.  Skin:    General: Skin is warm and dry.  Neurological:     General: No focal deficit present.     Mental Status: She is alert.     ED Results / Procedures / Treatments   Labs (all labs ordered are listed, but only abnormal results are displayed) Labs Reviewed  COMPREHENSIVE METABOLIC PANEL - Abnormal; Notable for the following components:      Result Value   Potassium 3.4 (*)    Glucose, Bld 100 (*)    All other components within normal limits  LIPASE, BLOOD  CBC  URINALYSIS, ROUTINE W REFLEX MICROSCOPIC    EKG None  Radiology CT ABDOMEN PELVIS W CONTRAST  Result Date: 09/10/2019 CLINICAL DATA:  Abdominal pain, bloating EXAM: CT ABDOMEN AND PELVIS WITH CONTRAST TECHNIQUE: Multidetector CT imaging of the abdomen and pelvis was performed using the standard protocol following bolus administration of intravenous  contrast. CONTRAST:  169m OMNIPAQUE IOHEXOL 300 MG/ML  SOLN COMPARISON:  09/16/2015 FINDINGS: Lower chest: Lung bases are clear. No effusions. Heart is normal size. Hepatobiliary: No focal hepatic abnormality. Gallbladder unremarkable. Pancreas: No focal abnormality or ductal dilatation. Spleen: No focal abnormality.  Normal size. Adrenals/Urinary Tract: No adrenal abnormality. No focal renal abnormality. No stones or hydronephrosis. Urinary bladder is unremarkable. Stomach/Bowel: Sigmoid diverticulosis. Inflammatory stranding around the mid sigmoid colon compatible with active diverticulitis. Stomach and small bowel decompressed, unremarkable. Vascular/Lymphatic: No evidence of aneurysm or adenopathy. Reproductive: Uterus and adnexa unremarkable.  No mass. Other: No free fluid or free air. Musculoskeletal: No acute bony abnormality. IMPRESSION: Sigmoid diverticulosis with early changes of active diverticulitis. Electronically Signed   By: KRolm BaptiseM.D.   On: 09/10/2019 18:52    Procedures Procedures (including critical care time)  Medications Ordered in ED Medications  sodium chloride flush (NS) 0.9 % injection 3 mL (3 mLs Intravenous Not Given 09/10/19 1651)  ciprofloxacin (CIPRO) tablet 500 mg (has no administration in time range)  metroNIDAZOLE (FLAGYL) tablet 500 mg (has no administration in time range)  iohexol (OMNIPAQUE) 300 MG/ML solution 100 mL (100 mLs Intravenous Contrast Given 09/10/19 1840)    ED Course  I have reviewed the triage vital signs and the nursing notes.  Pertinent labs & imaging results that were available during my care of the patient were reviewed by me and considered in my medical decision making (see chart for details).    MDM Rules/Calculators/A&P                          Imaging and labs reviewed and discussed with patient.  She was placed on Flagyl and Cipro, started both medications prior to discharge.  She was also prescribed a small quantity of  hydrocodone and also Zofran for symptom relief.  She was cautioned to use the hydrocodone sparingly and needs to recognize any worsening symptoms including worse pain, vomiting or return of her fevers which should prompt  reevaluation here.  We discussed increased fluid intake, dietary changes until her symptoms are improved.  Avoiding constipation.  Colace also prescribed.  She also mentions that she has a new insurance and therefore will need to find a new PCP is Dr. Karie Kirks is no longer in her plan.  She was given suggestions for finding a new local PCP. Final Clinical Impression(s) / ED Diagnoses Final diagnoses:  Diverticulitis    Rx / DC Orders ED Discharge Orders         Ordered    metroNIDAZOLE (FLAGYL) 500 MG tablet  3 times daily     Discontinue  Reprint     09/10/19 1911    ciprofloxacin (CIPRO) 500 MG tablet  Every 12 hours     Discontinue  Reprint     09/10/19 1911    HYDROcodone-acetaminophen (NORCO/VICODIN) 5-325 MG tablet  Every 4 hours PRN,   Status:  Discontinued     Reprint     09/10/19 1911    ondansetron (ZOFRAN ODT) 4 MG disintegrating tablet  Every 8 hours PRN     Discontinue  Reprint     09/10/19 1911    HYDROcodone-acetaminophen (NORCO/VICODIN) 5-325 MG tablet  Every 4 hours PRN     Discontinue  Reprint     09/10/19 1916    docusate sodium (COLACE) 100 MG capsule  Every 12 hours     Discontinue  Reprint     09/10/19 1930           Landis Martins 09/10/19 1930    Isla Pence, MD 09/10/19 [phone]

## 2019-09-10 NOTE — ED Notes (Signed)
Patient transported to CT 

## 2019-09-10 NOTE — ED Triage Notes (Signed)
abd pain and bloating x 2 weeks

## 2019-09-10 NOTE — Discharge Instructions (Addendum)
You have an early diverticulitis which explains your symptoms.  You will need to complete the antibiotics prescribed.  Have also prescribed pain and nausea medicine, use the pain medicine sparingly so you will recognize if your symptoms are worsening.  You will need to be reevaluated if you develop return of your fever or worse pain or uncontrolled vomiting.  Make sure you are drinking plenty of fluids and maintain a soft to bland diet as discussed for the next several days.  As your symptoms improve you may resume a normal diet.  Return here as discussed for any worsening symptoms.  Do not drive within 4 hours of taking hydrocodone as this medication will make you drowsy.  With your insurance change and your need to obtain a new primary provider I have provided you information above for Dr. Nevada Crane and Dr. Anastasio Champion who may be accepting new patients.

## 2019-09-10 NOTE — ED Triage Notes (Signed)
Pt presents to ED with complaints of lower abdominal pain and bloating x 2 weeks. Pt also reports LLQ tenderness and fullness. Pt reports indigestion x couple of months. Pt denies N/V/D. Pt states she had fever up to 101.2 over the weekend with her abd pain.

## 2019-09-10 NOTE — ED Notes (Signed)
Patient is being discharged from the Urgent Care and sent to the Emergency Department via pov . Per B. Wurst, patient is in need of higher level of care due to abd pain . Patient is aware and verbalizes understanding of plan of care.  Vitals:   09/10/19 1039  BP: (!) 164/105  Pulse: 90  Resp: 18  Temp: 97.9 F (36.6 C)  SpO2: 94%

## 2019-09-11 ENCOUNTER — Telehealth (INDEPENDENT_AMBULATORY_CARE_PROVIDER_SITE_OTHER): Payer: Self-pay

## 2019-09-11 ENCOUNTER — Telehealth (HOSPITAL_COMMUNITY): Payer: Self-pay | Admitting: Emergency Medicine

## 2019-09-11 MED ORDER — CIPROFLOXACIN HCL 500 MG PO TABS: 500.0000 mg | ORAL_TABLET | Freq: Two times a day (BID) | ORAL | 0 refills | Status: DC

## 2019-09-11 MED ORDER — HYDROCODONE-ACETAMINOPHEN 5-325 MG PO TABS: 1.0000 | ORAL_TABLET | Freq: Four times a day (QID) | ORAL | 0 refills | Status: DC | PRN

## 2019-09-11 MED ORDER — ONDANSETRON HCL 4 MG PO TABS: 4.0000 mg | ORAL_TABLET | Freq: Three times a day (TID) | ORAL | 0 refills | Status: DC | PRN

## 2019-09-11 MED ORDER — METRONIDAZOLE 500 MG PO TABS: 500.0000 mg | ORAL_TABLET | Freq: Three times a day (TID) | ORAL | 0 refills | Status: DC

## 2019-09-11 MED ORDER — DOCUSATE SODIUM 100 MG PO CAPS: 100.0000 mg | ORAL_CAPSULE | Freq: Two times a day (BID) | ORAL | 0 refills | Status: DC

## 2019-09-11 NOTE — Telephone Encounter (Cosign Needed)
Call from pt - prescriptions e scribed to [facility] were not received.  Confirmed by RN call to [facility].  Cancelled original orders and orders placed again.

## 2019-09-12 NOTE — Telephone Encounter (Cosign Needed)
Opened in error

## 2019-09-17 ENCOUNTER — Ambulatory Visit (INDEPENDENT_AMBULATORY_CARE_PROVIDER_SITE_OTHER): Payer: BLUE CROSS/BLUE SHIELD | Admitting: Nurse Practitioner

## 2019-09-17 ENCOUNTER — Other Ambulatory Visit: Payer: Self-pay

## 2019-09-17 ENCOUNTER — Encounter (INDEPENDENT_AMBULATORY_CARE_PROVIDER_SITE_OTHER): Payer: Self-pay | Admitting: Nurse Practitioner

## 2019-09-17 VITALS — BP 140/90 | HR 110 | Temp 96.0°F | Ht 69.0 in | Wt 267.8 lb

## 2019-09-17 DIAGNOSIS — Z13220 Encounter for screening for lipoid disorders: Secondary | ICD-10-CM | POA: Diagnosis not present

## 2019-09-17 DIAGNOSIS — E876 Hypokalemia: Secondary | ICD-10-CM | POA: Diagnosis not present

## 2019-09-17 DIAGNOSIS — Z131 Encounter for screening for diabetes mellitus: Secondary | ICD-10-CM

## 2019-09-17 DIAGNOSIS — R109 Unspecified abdominal pain: Secondary | ICD-10-CM

## 2019-09-17 DIAGNOSIS — Z8349 Family history of other endocrine, nutritional and metabolic diseases: Secondary | ICD-10-CM

## 2019-09-17 DIAGNOSIS — Z1329 Encounter for screening for other suspected endocrine disorder: Secondary | ICD-10-CM

## 2019-09-17 NOTE — Progress Notes (Signed)
Subjective:  Patient ID: [name], female    DOB: 1960/11/30  Age: 59 y.o. MRN: [id number]  CC:  Chief Complaint  Patient presents with  . Follow-up    ER f/u for diverticulitis  . Dark Stools  . Establish Care      HPI  This patient comes in today to establish care at this office. She is a 59 year old female with a past medical history of anxiety, asthma, breast cancer, depression, hypertension, and diverticulitis.  She is establishing care at this office because her previous provider is no longer under her insurance is network.  She is recently seen in the ER for abdominal pain on 09/10/19.  She was diagnosed with diverticulitis at that time, and she was discharged on oral antibiotics.  Today, she tells me she continues to have some mild dull achiness to the right side of her abdomen as well as bloating.  The left lower quadrant of her abdomen where her pain was bothering her during her emergency department visit seems to have improved.  She tells me she has 1 day left of antibiotics until she has completed this course.  She continues to have bowel movements.  She tells me that her stools to appear dark.  She does not experiencing much nausea or vomiting any longer, but continues to have a reduced appetite.  Per chart review I do see where abdominal CT scan was completed on 09/10/2019.  Only abnormality noted was diverticulosis with signs of diverticulitis to her sigmoid colon.  She does mention that she has a history of partial hysterectomy but ovaries and cervix are still intact.  She is concerned that her pain may be related to these organs.   Past Medical History:  Diagnosis Date  . Anxiety   . Asthma   . Breast cancer ([place]) 2009   Tamoxifen, left lumpectomy  . Cough   . Cramps, muscle, general   . Depression   . Diarrhea   . Hypertension   . Palpitation   . Visual disturbance   . Wheezing       Family History  Problem Relation Age of Onset  . Diabetes  Mother   . Stroke Mother   . Hypertension Mother   . Diabetes Father   . Cancer Paternal Aunt        ovarian  . Hypertension Sister   . Hypertension Brother   . Cancer Cousin        colon  . Colon cancer Other        no first degree relatives    Social History   Social History Narrative  . Not on file   Social History   Tobacco Use  . Smoking status: Former Smoker    Quit date: 03/02/1990    Years since quitting: 29.5  . Smokeless tobacco: Never Used  Substance Use Topics  . Alcohol use: No    Alcohol/week: 0.0 standard drinks     Current Meds  Medication Sig  . aspirin EC 81 MG tablet Take 81 mg by mouth daily. Swallow whole.  . Cholecalciferol (VITAMIN D-3) 1000 UNITS CAPS Take 1,000 Units by mouth daily.   . hydrochlorothiazide (HYDRODIURIL) 25 MG tablet Take 25 mg by mouth daily.    Marland Kitchen HYDROcodone-acetaminophen (NORCO/VICODIN) 5-325 MG tablet Take 1 tablet by mouth every 6 (six) hours as needed for moderate pain.  . hyoscyamine (LEVSIN SL) 0.125 MG SL tablet Place 1 tablet (0.125 mg total) under the  tongue every 4 (four) hours as needed.  Marland Kitchen LORazepam (ATIVAN) 0.5 MG tablet Take 0.5 mg by mouth every 8 (eight) hours as needed for anxiety.   . Methylsulfonylmethane (MSM PO) Take 4,500 mg by mouth daily.  . Multiple Vitamin (MULTI-VITAMIN DAILY) TABS Take by mouth.  . Omega 3 1000 MG CAPS Take 1 capsule by mouth daily.  Marland Kitchen OVER THE COUNTER MEDICATION Take by mouth daily. Sugar Blockers  . potassium chloride SA (K-DUR,KLOR-CON) 20 MEQ tablet Take 20 mEq by mouth daily.  . Probiotic Product (PROBIOTIC-10 PO) Take 10 mg by mouth daily.  . Sodium Hyaluronate, oral, (HYALURONIC ACID) 100 MG CAPS Take 50 mg by mouth daily.  . Turmeric (QC TUMERIC COMPLEX) 500 MG CAPS Take 500 mg by mouth daily.  Marland Kitchen venlafaxine (EFFEXOR) 50 MG tablet Take 50 mg by mouth 2 (two) times daily.      ROS:  Review of Systems  Constitutional: Positive for malaise/fatigue.  Respiratory: Negative.    Cardiovascular: Negative.   Gastrointestinal: Positive for abdominal pain and melena. Negative for blood in stool.  Neurological: Negative.      Objective:   Today's Vitals: BP 140/90 (BP Location: Left Arm, Patient Position: Sitting, Cuff Size: Normal)   Pulse (!) 110   Temp (!) 96 F (35.6 C) (Temporal)   Ht 5' 9" (1.753 m)   Wt 267 lb 12.8 oz (121.5 kg)   SpO2 97%   BMI 39.55 kg/m  Vitals with BMI 09/17/2019 09/10/2019 09/10/2019  Height 5' 9" - -  Weight 267 lbs 13 oz - -  BMI 22.02 - -  Systolic 542 706 237  Diastolic 90 83 89  Pulse 628 69 82     Physical Exam Vitals reviewed. Exam conducted with a chaperone present.  Constitutional:      Appearance: Normal appearance.  HENT:     Head: Normocephalic and atraumatic.     Right Ear: Tympanic membrane, ear canal and external ear normal.     Left Ear: Tympanic membrane, ear canal and external ear normal.  Eyes:     General:        Right eye: No discharge.        Left eye: No discharge.     Extraocular Movements: Extraocular movements intact.     Conjunctiva/sclera: Conjunctivae normal.     Pupils: Pupils are equal, round, and reactive to light.  Neck:     Vascular: No carotid bruit.  Cardiovascular:     Rate and Rhythm: Normal rate and regular rhythm.     Pulses: Normal pulses.     Heart sounds: Normal heart sounds. No murmur heard.   Pulmonary:     Effort: Pulmonary effort is normal.     Breath sounds: Normal breath sounds.  Chest:     Breasts: Breasts are symmetrical.        Right: Normal.        Left: Normal.  Abdominal:     General: Abdomen is flat. Bowel sounds are normal. There is no distension.     Palpations: Abdomen is soft. There is no mass.     Tenderness: There is abdominal tenderness in the right upper quadrant and right lower quadrant. There is no guarding. Negative signs include Murphy's sign.     Hernia: No hernia is present.  Genitourinary:    Rectum: Normal. Guaiac result negative. No  mass, tenderness, anal fissure, external hemorrhoid or internal hemorrhoid. Normal anal tone.  Musculoskeletal:  General: No tenderness.     Cervical back: Neck supple. No muscular tenderness.     Right lower leg: No edema.     Left lower leg: No edema.  Lymphadenopathy:     Cervical: No cervical adenopathy.     Upper Body:     Right upper body: No supraclavicular adenopathy.     Left upper body: No supraclavicular adenopathy.  Skin:    General: Skin is warm and dry.  Neurological:     General: No focal deficit present.     Mental Status: She is alert and oriented to person, place, and time.     Motor: No weakness.     Gait: Gait normal.  Psychiatric:        Mood and Affect: Mood normal.        Behavior: Behavior normal.        Judgment: Judgment normal.          Assessment and Plan   1. Abdominal pain, unspecified abdominal location   2. Hypokalemia   3. Screening for lipid disorders   4. Screening for diabetes mellitus   5. Screening for thyroid disorder   6. Family history of vitamin D deficiency      Plan: 1.-6.  I think her pain may be residual from the diverticulitis, however the fact that the location is more on the right side as opposed to the left is interesting.  She does continue to have regular bowel movements.  For now I recommend she focus on completing her antibiotic course and if her symptoms persist over the next 10 days she should call this office we may need to consider further evaluation or referral to gastroenterology.  I did discuss her CT scan results with her and did reassure her that all other organs visualized on CT scan appeared unremarkable and no masses were noted on the scan either.  She seemed to feel relief after having this conversation.  Of note, she was mildly hypokalemic with her blood work in the emergency department so we will repeat blood work today including CMP, with a panel (she tells me she is fasting today), A1c, TSH, and a  serum vitamin D level as she does mention she has a history of vitamin D deficiency in addition to experiencing fatigue we went over her review of systems.     Tests ordered Orders Placed This Encounter  Procedures  . CMP with eGFR([facility])  . CBC  . Lipid Panel  . Hemoglobin A1c  . TSH  . Vitamin D, 25-hydroxy      No orders of the defined types were placed in this encounter.   Patient to follow-up in 1 month with Dr. Anastasio Champion to discuss her blood work results, complete annual physical exam, and she was told to call our office if her symptoms persist or worsen over the next 10 days.  Ailene Ards, NP

## 2019-09-18 ENCOUNTER — Other Ambulatory Visit (HOSPITAL_COMMUNITY): Payer: Self-pay | Admitting: Internal Medicine

## 2019-09-18 ENCOUNTER — Other Ambulatory Visit (INDEPENDENT_AMBULATORY_CARE_PROVIDER_SITE_OTHER): Payer: Self-pay

## 2019-09-18 ENCOUNTER — Encounter (INDEPENDENT_AMBULATORY_CARE_PROVIDER_SITE_OTHER): Payer: Self-pay

## 2019-09-18 ENCOUNTER — Telehealth (INDEPENDENT_AMBULATORY_CARE_PROVIDER_SITE_OTHER): Payer: Self-pay

## 2019-09-18 ENCOUNTER — Other Ambulatory Visit (INDEPENDENT_AMBULATORY_CARE_PROVIDER_SITE_OTHER): Payer: BLUE CROSS/BLUE SHIELD

## 2019-09-18 DIAGNOSIS — Z1231 Encounter for screening mammogram for malignant neoplasm of breast: Secondary | ICD-10-CM

## 2019-09-18 MED ORDER — DULOXETINE HCL 60 MG PO CPEP: 60.0000 mg | ORAL_CAPSULE | Freq: Every day | ORAL | 2 refills | Status: DC

## 2019-09-18 NOTE — Telephone Encounter (Signed)
Yvonda is aware of the recommendation

## 2019-09-18 NOTE — Telephone Encounter (Signed)
Brittiany is asking since her Vitamin D and Potassium was low does she need to take anything for this, please advise?

## 2019-09-18 NOTE — Telephone Encounter (Signed)
I believe she came in today for blood work.  For now, tell her I have no recommendations until I see the results of her blood work that was drawn today.

## 2019-09-19 LAB — COMPLETE METABOLIC PANEL WITH GFR
AG Ratio: 1.4 (calc) (ref 1.0–2.5)
ALT: 29 U/L (ref 6–29)
AST: 25 U/L (ref 10–35)
Albumin: 4 g/dL (ref 3.6–5.1)
Alkaline phosphatase (APISO): 78 U/L (ref 37–153)
BUN: 17 mg/dL (ref 7–25)
CO2: 30 mmol/L (ref 20–32)
Calcium: 9.3 mg/dL (ref 8.6–10.4)
Chloride: 101 mmol/L (ref 98–110)
Creat: 0.79 mg/dL (ref 0.50–1.05)
GFR, Est African American: 97 mL/min/{1.73_m2} (ref >=60)
GFR, Est Non African American: 84 mL/min/{1.73_m2} (ref >=60)
Globulin: 2.9 g/dL (calc) (ref 1.9–3.7)
Glucose, Bld: 111 mg/dL — ABNORMAL HIGH (ref 65–99)
Potassium: 3.6 mmol/L (ref 3.5–5.3)
Sodium: 140 mmol/L (ref 135–146)
Total Bilirubin: 0.5 mg/dL (ref 0.2–1.2)
Total Protein: 6.9 g/dL (ref 6.1–8.1)

## 2019-09-19 LAB — LIPID PANEL
Cholesterol: 170 mg/dL (ref <200)
HDL: 73 mg/dL (ref >=50)
LDL Cholesterol (Calc): 83 mg/dL (calc)
Non-HDL Cholesterol (Calc): 97 mg/dL (calc) (ref <130)
Total CHOL/HDL Ratio: 2.3 (calc) (ref <5.0)
Triglycerides: 68 mg/dL (ref <150)

## 2019-09-19 LAB — CBC
HCT: 40.5 % (ref 35.0–45.0)
Hemoglobin: 13.5 g/dL (ref 11.7–15.5)
MCH: 28.2 pg (ref 27.0–33.0)
MCHC: 33.3 g/dL (ref 32.0–36.0)
MCV: 84.6 fL (ref 80.0–100.0)
MPV: 9.5 fL (ref 7.5–12.5)
Platelets: 278 10*3/uL (ref 140–400)
RBC: 4.79 10*6/uL (ref 3.80–5.10)
RDW: 13 % (ref 11.0–15.0)
WBC: 6.9 10*3/uL (ref 3.8–10.8)

## 2019-09-19 LAB — VITAMIN D 25 HYDROXY (VIT D DEFICIENCY, FRACTURES): Vit D, 25-Hydroxy: 50 ng/mL (ref 30–100)

## 2019-09-19 LAB — HEMOGLOBIN A1C
Hgb A1c MFr Bld: 5.9 % of total Hgb — ABNORMAL HIGH (ref <5.7)
Mean Plasma Glucose: 123 (calc)
eAG (mmol/L): 6.8 (calc)

## 2019-09-19 LAB — TSH: TSH: 2.1 mIU/L (ref 0.40–4.50)

## 2019-09-25 ENCOUNTER — Other Ambulatory Visit (INDEPENDENT_AMBULATORY_CARE_PROVIDER_SITE_OTHER): Payer: Self-pay

## 2019-09-29 MED ORDER — LORAZEPAM 0.5 MG PO TABS: 0.5000 mg | ORAL_TABLET | Freq: Three times a day (TID) | ORAL | 0 refills | Status: DC | PRN

## 2019-10-03 ENCOUNTER — Other Ambulatory Visit: Payer: Self-pay

## 2019-10-03 ENCOUNTER — Ambulatory Visit (HOSPITAL_COMMUNITY)
Admission: RE | Admit: 2019-10-03 | Discharge: 2019-10-03 | Disposition: A | Discharge status: Home / Self Care | Payer: BLUE CROSS/BLUE SHIELD | Source: Ambulatory Visit | Attending: Internal Medicine | Admitting: Internal Medicine

## 2019-10-03 DIAGNOSIS — Z1231 Encounter for screening mammogram for malignant neoplasm of breast: Secondary | ICD-10-CM | POA: Diagnosis not present

## 2019-10-06 ENCOUNTER — Other Ambulatory Visit (HOSPITAL_COMMUNITY): Payer: Self-pay | Admitting: Internal Medicine

## 2019-10-06 DIAGNOSIS — R928 Other abnormal and inconclusive findings on diagnostic imaging of breast: Secondary | ICD-10-CM

## 2019-10-11 ENCOUNTER — Other Ambulatory Visit (INDEPENDENT_AMBULATORY_CARE_PROVIDER_SITE_OTHER): Payer: Self-pay | Admitting: Nurse Practitioner

## 2019-10-14 ENCOUNTER — Encounter (HOSPITAL_COMMUNITY): Payer: BLUE CROSS/BLUE SHIELD

## 2019-10-14 ENCOUNTER — Encounter (HOSPITAL_COMMUNITY): Payer: Self-pay

## 2019-10-14 ENCOUNTER — Ambulatory Visit (HOSPITAL_COMMUNITY): Payer: BLUE CROSS/BLUE SHIELD

## 2019-10-23 ENCOUNTER — Encounter (INDEPENDENT_AMBULATORY_CARE_PROVIDER_SITE_OTHER): Payer: BLUE CROSS/BLUE SHIELD | Admitting: Internal Medicine

## 2019-10-29 ENCOUNTER — Encounter (INDEPENDENT_AMBULATORY_CARE_PROVIDER_SITE_OTHER): Payer: BLUE CROSS/BLUE SHIELD | Admitting: Internal Medicine

## 2019-10-29 ENCOUNTER — Other Ambulatory Visit (INDEPENDENT_AMBULATORY_CARE_PROVIDER_SITE_OTHER): Payer: Self-pay | Admitting: Nurse Practitioner

## 2019-10-29 ENCOUNTER — Telehealth (INDEPENDENT_AMBULATORY_CARE_PROVIDER_SITE_OTHER): Payer: Self-pay | Admitting: Nurse Practitioner

## 2019-10-29 MED ORDER — LORAZEPAM 0.5 MG PO TABS
ORAL_TABLET | ORAL | 0 refills | Status: DC
Start: 2019-10-29 — End: 2020-08-03

## 2019-10-29 NOTE — Telephone Encounter (Signed)
[name], please call this patient and notify her that I can not refill her lorazepam as she has not been keeping her appointments as scheduled. I will send in enough of the medication for her to wean herself off of it. Please tell her to take 0.5 tablets by mouth twice a day as needed for anxiety for 2 weeks, then 0.5 tablets by mouth daily for 2 weeks then stop.

## 2019-10-29 NOTE — Telephone Encounter (Signed)
[name] is stating that she has found out that her insurance does not participate with [place]

## 2019-11-04 ENCOUNTER — Encounter (INDEPENDENT_AMBULATORY_CARE_PROVIDER_SITE_OTHER): Payer: Self-pay

## 2019-11-18 ENCOUNTER — Other Ambulatory Visit (INDEPENDENT_AMBULATORY_CARE_PROVIDER_SITE_OTHER): Payer: Self-pay

## 2019-11-24 ENCOUNTER — Other Ambulatory Visit (INDEPENDENT_AMBULATORY_CARE_PROVIDER_SITE_OTHER): Payer: Self-pay

## 2019-11-24 MED ORDER — HYDROCHLOROTHIAZIDE 25 MG PO TABS
25.0000 mg | ORAL_TABLET | Freq: Every day | ORAL | 0 refills | Status: DC
Start: 2019-11-24 — End: 2020-08-03

## 2019-12-16 ENCOUNTER — Other Ambulatory Visit (INDEPENDENT_AMBULATORY_CARE_PROVIDER_SITE_OTHER): Payer: Self-pay | Admitting: Nurse Practitioner

## 2020-04-27 ENCOUNTER — Other Ambulatory Visit (INDEPENDENT_AMBULATORY_CARE_PROVIDER_SITE_OTHER): Payer: Self-pay | Admitting: Internal Medicine

## 2020-08-03 ENCOUNTER — Other Ambulatory Visit: Payer: Self-pay

## 2020-08-03 ENCOUNTER — Encounter (INDEPENDENT_AMBULATORY_CARE_PROVIDER_SITE_OTHER): Payer: Self-pay | Admitting: Nurse Practitioner

## 2020-08-03 ENCOUNTER — Telehealth (INDEPENDENT_AMBULATORY_CARE_PROVIDER_SITE_OTHER): Payer: Self-pay | Admitting: Nurse Practitioner

## 2020-08-03 ENCOUNTER — Ambulatory Visit (INDEPENDENT_AMBULATORY_CARE_PROVIDER_SITE_OTHER): Payer: 59 | Admitting: Nurse Practitioner

## 2020-08-03 VITALS — BP 130/84 | HR 113 | Temp 97.2°F | Ht 69.0 in | Wt 267.6 lb

## 2020-08-03 DIAGNOSIS — I10 Essential (primary) hypertension: Secondary | ICD-10-CM | POA: Diagnosis not present

## 2020-08-03 DIAGNOSIS — F32A Depression, unspecified: Secondary | ICD-10-CM

## 2020-08-03 DIAGNOSIS — Z124 Encounter for screening for malignant neoplasm of cervix: Secondary | ICD-10-CM

## 2020-08-03 DIAGNOSIS — J45909 Unspecified asthma, uncomplicated: Secondary | ICD-10-CM

## 2020-08-03 DIAGNOSIS — Z1231 Encounter for screening mammogram for malignant neoplasm of breast: Secondary | ICD-10-CM

## 2020-08-03 DIAGNOSIS — R928 Other abnormal and inconclusive findings on diagnostic imaging of breast: Secondary | ICD-10-CM

## 2020-08-03 MED ORDER — HYDROCHLOROTHIAZIDE 25 MG PO TABS: 25.0000 mg | ORAL_TABLET | Freq: Every day | ORAL | 1 refills | Status: DC

## 2020-08-03 MED ORDER — DULOXETINE HCL 60 MG PO CPEP: 60.0000 mg | ORAL_CAPSULE | Freq: Every day | ORAL | 1 refills | Status: DC

## 2020-08-03 NOTE — Telephone Encounter (Signed)
Ordered mammogram (diagnostic for R as well as ultrasound) and screening mammo from [facility].   Also referral to allergist made today. Please try to keep her in [place] network and in [place] if possible.   Ordered referral to OBGYN for pap smear.

## 2020-08-03 NOTE — Progress Notes (Signed)
Subjective:  Patient ID: [name], female    DOB: Oct 02, 1960  Age: 60 y.o. MRN: [id number]  CC:  Chief Complaint  Patient presents with  . Asthma  . Allergic Rhinitis   . Hypertension  . Other    History of Breast Ca and abnormal mammogram      HPI  This patient arrives today for the above.  She returns after long hiatus due to difficulties with insurance covering visits with our practice.  She tells me now her insurance will cover our office and so she wants to reestablish care here.  Asthma/allergic rhinitis: She does have a history of asthma and has had significant issues with controlling her allergies this allergy season.  She tells me she used to be on allergy shots, and would like to be referred back to an allergist.  Hypertension: She has a history of hypertension and continues on hydrochlorothiazide 25 mg daily.  Last metabolic panel showed normal kidney function and normal electrolytes.  This was collected nearly 1 year ago.  History of breast cancer and abnormal mammogram: About 10 months ago she had screening mammogram which showed abnormal findings to the right breast.  She was recommended to undergo diagnostic mammogram with ultrasound but had difficulties with finances and insurance covering this.  Thus, diagnostic mammogram was never completed.  She is wondering if we can get this set up for her now.  She denies feeling any specific masses or lumps or having skin changes to her breast.  She does report having dense breasts, but considering her history of breast cancer she is at risk for recurrence.  Depression: She continues on duloxetine and is tolerating his medication well.  Past Medical History:  Diagnosis Date  . Anxiety   . Asthma   . Breast cancer ([place]) 2009   Tamoxifen, left lumpectomy  . Cough   . Cramps, muscle, general   . Depression   . Diarrhea   . Hypertension   . Palpitation   . Visual disturbance   . Wheezing       Family  History  Problem Relation Age of Onset  . Diabetes Mother   . Stroke Mother   . Hypertension Mother   . Diabetes Father   . Cancer Paternal Aunt        ovarian  . Hypertension Sister   . Hypertension Brother   . Cancer Cousin        colon  . Colon cancer Other        no first degree relatives    Social History   Social History Narrative  . Not on file   Social History   Tobacco Use  . Smoking status: Former Smoker    Quit date: 03/02/1990    Years since quitting: 30.4  . Smokeless tobacco: Never Used  Substance Use Topics  . Alcohol use: No    Alcohol/week: 0.0 standard drinks     Current Meds  Medication Sig  . aspirin EC 81 MG tablet Take 81 mg by mouth daily. Swallow whole.  . Chlorpheniramine Maleate (ALLERGY PO) Take 25 mg by mouth daily.  . Methylsulfonylmethane (MSM PO) Take 4,500 mg by mouth daily.  . Multiple Vitamins-Minerals (MULTIVITAMIN WITH MINERALS) tablet Take 1 tablet by mouth daily. Vitamin D3 is in it (1,000Ius)  . OVER THE COUNTER MEDICATION Take by mouth daily. Sugar Blockers  . potassium chloride SA (K-DUR,KLOR-CON) 20 MEQ tablet Take 20 mEq by mouth daily.  . Probiotic  Product (PROBIOTIC-10 PO) Take 10 mg by mouth daily.  . Sodium Hyaluronate, oral, (HYALURONIC ACID) 100 MG CAPS Take 50 mg by mouth daily.  . Theanine 200 MG CAPS Take 200 mg by mouth daily.  . [DISCONTINUED] Cholecalciferol (VITAMIN D-3) 1000 UNITS CAPS Take 1,000 Units by mouth daily.   . [DISCONTINUED] DULoxetine (CYMBALTA) 60 MG capsule Take 1 capsule by mouth once daily  . [DISCONTINUED] hydrochlorothiazide (HYDRODIURIL) 25 MG tablet Take 1 tablet (25 mg total) by mouth daily. Please let the patient know that she needs to make an appointment for follow-up visit before I can refill further prescriptions.  . [DISCONTINUED] Omega 3 1000 MG CAPS Take 1 capsule by mouth daily.  . [DISCONTINUED] Turmeric 500 MG CAPS Take 500 mg by mouth daily.    ROS:  Review of Systems   Constitutional: Positive for malaise/fatigue.  Respiratory: Negative for shortness of breath.   Cardiovascular: Negative for chest pain.  Skin: Negative for rash.  Psychiatric/Behavioral: Positive for depression. The patient is nervous/anxious.      Objective:   Today's Vitals: BP 130/84   Pulse (!) 113   Temp (!) 97.2 F (36.2 C)   Ht _0  (1.753 m)   Wt 267 lb 9.6 oz (121.4 kg)   SpO2 97%   BMI 39.52 kg/m  Vitals with BMI 08/03/2020 09/17/2019 09/10/2019  Height _1  _2  -  Weight 267 lbs 10 oz 267 lbs 13 oz -  BMI 92.4 26.83 -  Systolic 419 622 297  Diastolic 84 90 83  Pulse 989 110 69     Physical Exam Vitals reviewed.  Constitutional:      General: She is not in acute distress.    Appearance: Normal appearance.  HENT:     Head: Normocephalic and atraumatic.  Neck:     Vascular: No carotid bruit.  Cardiovascular:     Rate and Rhythm: Normal rate and regular rhythm.     Pulses: Normal pulses.     Heart sounds: Normal heart sounds.  Pulmonary:     Effort: Pulmonary effort is normal.     Breath sounds: Normal breath sounds.  Skin:    General: Skin is warm and dry.  Neurological:     General: No focal deficit present.     Mental Status: She is alert and oriented to person, place, and time.  Psychiatric:        Mood and Affect: Mood normal.        Behavior: Behavior normal.        Judgment: Judgment normal.          Assessment and Plan   1. Hypertension, unspecified type   2. Depression, unspecified depression type   3. Encounter for screening mammogram for malignant neoplasm of breast   4. Abnormal mammogram   5. Asthma, unspecified asthma severity, unspecified whether complicated, unspecified whether persistent   6. Cervical cancer screening      Plan: 1.  We will recheck CMP today and refill her hydrochlorothiazide. 2.  We will refill her duloxetine. 3., 4.  We will order screening mammogram of the left breast and diagnostic mammogram of  the right breast with ultrasound if needed.  Further recommendations any made based upon these results. 5.  We will refer her to allergist for further assistance with managing her asthma and allergic rhinitis. 6.  We will refer her to OB/GYN for Pap smear.   Tests ordered Orders Placed This Encounter  Procedures  . MM  Digital Diagnostic Unilat R  . US BREAST LTD UNI RIGHT INC AXILLA  . MM Digital Screening Unilat L  . CMP with eGFR([facility])  . Ambulatory referral to Allergy  . Ambulatory referral to Obstetrics / Gynecology      Meds ordered this encounter  Medications  . DULoxetine (CYMBALTA) 60 MG capsule    Sig: Take 1 capsule (60 mg total) by mouth daily.    Dispense:  90 capsule    Refill:  1    Patient needs to make a follow-up appointment before I can prescribe further medication on this patient.    Order Specific Question:   Supervising Provider    Answer:   Doree Albee [[id number]]  . hydrochlorothiazide (HYDRODIURIL) 25 MG tablet    Sig: Take 1 tablet (25 mg total) by mouth daily.    Dispense:  90 tablet    Refill:  1    Order Specific Question:   Supervising Provider    Answer:   Doree Albee [[id number]]    Patient to follow-up in 3 to 6 months for annual exam.  Ailene Ards, NP

## 2020-08-04 LAB — COMPLETE METABOLIC PANEL WITH GFR
AG Ratio: 1.4 (calc) (ref 1.0–2.5)
ALT: 14 U/L (ref 6–29)
AST: 16 U/L (ref 10–35)
Albumin: 3.9 g/dL (ref 3.6–5.1)
Alkaline phosphatase (APISO): 74 U/L (ref 37–153)
BUN: 15 mg/dL (ref 7–25)
CO2: 31 mmol/L (ref 20–32)
Calcium: 9.2 mg/dL (ref 8.6–10.4)
Chloride: 102 mmol/L (ref 98–110)
Creat: 0.76 mg/dL (ref 0.50–1.05)
GFR, Est African American: 101 mL/min/{1.73_m2} (ref >=60)
GFR, Est Non African American: 87 mL/min/{1.73_m2} (ref >=60)
Globulin: 2.8 g/dL (calc) (ref 1.9–3.7)
Glucose, Bld: 157 mg/dL — ABNORMAL HIGH (ref 65–139)
Potassium: 3.6 mmol/L (ref 3.5–5.3)
Sodium: 141 mmol/L (ref 135–146)
Total Bilirubin: 0.5 mg/dL (ref 0.2–1.2)
Total Protein: 6.7 g/dL (ref 6.1–8.1)

## 2020-08-05 ENCOUNTER — Encounter (INDEPENDENT_AMBULATORY_CARE_PROVIDER_SITE_OTHER): Payer: Self-pay

## 2020-08-05 NOTE — Telephone Encounter (Signed)
Okay please send her a letter regarding her appointments. Thank you.

## 2020-08-05 NOTE — Telephone Encounter (Signed)
So the diagnostic will be done 1st, then mammogram. She has a full voicemail so I cannot leave a message.

## 2020-08-05 NOTE — Telephone Encounter (Signed)
Sent letter [employer]

## 2020-09-01 ENCOUNTER — Telehealth (INDEPENDENT_AMBULATORY_CARE_PROVIDER_SITE_OTHER): Payer: Self-pay

## 2020-09-02 ENCOUNTER — Other Ambulatory Visit (HOSPITAL_COMMUNITY): Payer: Self-pay | Admitting: Nurse Practitioner

## 2020-09-02 DIAGNOSIS — R928 Other abnormal and inconclusive findings on diagnostic imaging of breast: Secondary | ICD-10-CM

## 2020-09-14 ENCOUNTER — Other Ambulatory Visit (HOSPITAL_COMMUNITY): Payer: 59

## 2020-09-14 ENCOUNTER — Ambulatory Visit (HOSPITAL_COMMUNITY): Payer: 59

## 2020-09-14 ENCOUNTER — Encounter (HOSPITAL_COMMUNITY): Payer: 59

## 2020-09-15 ENCOUNTER — Other Ambulatory Visit: Payer: 59 | Admitting: Advanced Practice Midwife

## 2020-09-15 ENCOUNTER — Ambulatory Visit: Payer: 59 | Admitting: Allergy & Immunology

## 2020-09-15 ENCOUNTER — Other Ambulatory Visit: Payer: Self-pay

## 2020-09-15 ENCOUNTER — Encounter: Payer: Self-pay | Admitting: Allergy & Immunology

## 2020-09-15 VITALS — BP 140/90 | HR 106 | Temp 97.5°F | Resp 18 | Ht 69.0 in | Wt 270.4 lb

## 2020-09-15 DIAGNOSIS — L2089 Other atopic dermatitis: Secondary | ICD-10-CM | POA: Diagnosis not present

## 2020-09-15 DIAGNOSIS — J3089 Other allergic rhinitis: Secondary | ICD-10-CM | POA: Diagnosis not present

## 2020-09-15 DIAGNOSIS — J4530 Mild persistent asthma, uncomplicated: Secondary | ICD-10-CM

## 2020-09-15 DIAGNOSIS — J302 Other seasonal allergic rhinitis: Secondary | ICD-10-CM

## 2020-09-15 DIAGNOSIS — T7800XA Anaphylactic reaction due to unspecified food, initial encounter: Secondary | ICD-10-CM

## 2020-09-15 MED ORDER — MONTELUKAST SODIUM 10 MG PO TABS: 10.0000 mg | ORAL_TABLET | Freq: Every day | ORAL | 1 refills | Status: DC

## 2020-09-15 MED ORDER — PROAIR DIGIHALER 108 (90 BASE) MCG/ACT IN AEPB: 2.0000 | INHALATION_SPRAY | RESPIRATORY_TRACT | 1 refills | Status: DC | PRN

## 2020-09-15 MED ORDER — FLUTICASONE PROPIONATE 50 MCG/ACT NA SUSP: 1.0000 | Freq: Every day | NASAL | 1 refills | Status: DC

## 2020-09-15 MED ORDER — ARMONAIR DIGIHALER 232 MCG/ACT IN AEPB: 1.0000 | INHALATION_SPRAY | Freq: Two times a day (BID) | RESPIRATORY_TRACT | 1 refills | Status: DC

## 2020-09-15 MED ORDER — TRIAMCINOLONE ACETONIDE 0.1 % EX OINT: 1.0000 "application " | TOPICAL_OINTMENT | Freq: Two times a day (BID) | CUTANEOUS | 3 refills | Status: DC | PRN

## 2020-09-15 NOTE — Patient Instructions (Addendum)
1. Mild persistent asthma, uncomplicated - Lung testing not done since our machine was not working.  - However, we are going to go ahead and treat you as asthma. - We are going to start you an inhaled steroid since you are using albuterol so frequently.  - Daily controller medication(s): Armonair 274mcg one puff twice daily (sample provided with copay card) - Prior to physical activity: ProAir Digihaler 2 puffs 10-15 minutes before physical activity. - Rescue medications: ProAir Digihaler 4 puffs every 4-6 hours as needed - Changes during respiratory infections or worsening symptoms: Increase Armonair 213mcg to 2 puffs twice daily for TWO WEEKS. - Asthma control goals:  * Full participation in all desired activities (may need albuterol before activity) * Albuterol use two time or less a week on average (not counting use with activity) * Cough interfering with sleep two time or less a month * Oral steroids no more than once a year * No hospitalizations.  2. Chronic rhinitis - Testing today showed: ragweed, indoor molds, and outdoor molds - Copy of test results provided.  - Avoidance measures provided. - Continue with: over the counter antihistamines 1-2 times daily - Start taking: Singulair (montelukast) 10mg  daily and Flonase (fluticasone) one spray per nostril daily - Singulair can cause irritability and weird dreams, but this is RARE (call us if this happens). - It can also cause depression, so beware of that as well.  - You can use an extra dose of the antihistamine, if needed, for breakthrough symptoms.  - Consider nasal saline rinses 1-2 times daily to remove allergens from the nasal cavities as well as help with mucous clearance (this is especially helpful to do before the nasal sprays are given) - Consider allergy shots as a means of long-term control. - Allergy shots "re-train" and "reset" the immune system to ignore environmental allergens and decrease the resulting immune response  to those allergens (sneezing, itchy watery eyes, runny nose, nasal congestion, etc).    - Allergy shots improve symptoms in 75-85% of patients.  - We can discuss more at the next appointment if the medications are not working for you.  3. Return in about 4 weeks (around 10/13/2020).    Please inform us of any Emergency Department visits, hospitalizations, or changes in symptoms. Call us before going to the ED for breathing or allergy symptoms since we might be able to fit you in for a sick visit. Feel free to contact us anytime with any questions, problems, or concerns.  It was a pleasure to meet you today!  Websites that have reliable patient information: 1. American Academy of Asthma, Allergy, and Immunology: www.aaaai.org 2. Food Allergy Research and Education (FARE): foodallergy.org 3. Mothers of Asthmatics: http://www.asthmacommunitynetwork.org 4. American College of Allergy, Asthma, and Immunology: www.acaai.org   COVID-19 Vaccine Information can be found at: ShippingScam.co.uk For questions related to vaccine distribution or appointments, please email vaccine@[email or web address] or call [phone].   We realize that you might be concerned about having an allergic reaction to the COVID19 vaccines. To help with that concern, WE ARE OFFERING THE COVID19 VACCINES IN OUR OFFICE! Ask the front desk for dates!     "Like" [place] on Facebook and Instagram for our latest updates!      A healthy democracy works best when [employer] participate! Make sure you are registered to vote! If you have moved or changed any of your contact information, you will need to get this updated before voting!  In some cases, you MAY be able  to register to vote online: [email or web address]    Control Negative   [place] Negative   [place] Negative   7 Grass Negative   Ragweed mix 4+   Weed mix Negative   Tree mix Negative    Mold 1 1+   Mold 2 Negative   Mold 3 Negative   Mold 4 1+   Cat Negative   Dog Negative   Cockroach Negative   Mite mix Negative

## 2020-09-15 NOTE — Progress Notes (Signed)
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 Mild persistent asthma, uncomplicated - Lung testing not done since our machine was not working.  - However, we are going to go ahead and treat you as asthma. - We are going to start you an inhaled steroid since you are using albuterol so frequently.  - Daily controller medication(s): Armonair 285mcg one puff twice daily (sample provided with copay card) - Prior to physical activity: ProAir Digihaler 2 puffs 10-15 minutes before physical activity. - Rescue medications: ProAir Digihaler 4 puffs every 4-6 hours as needed - Changes during respiratory infections or worsening symptoms: Increase Armonair 262mcg to 2 puffs twice daily for TWO WEEKS. - Asthma control goals:  * Full participation in all desired activities (may need albuterol before activity) * Albuterol use two time or less a week on average (not counting use with activity) * Cough interfering with sleep two time or less a month * Oral steroids no more than once a year * No hospitalizations.  2. Chronic rhinitis - Testing today showed: ragweed, indoor molds, and outdoor molds - Copy of test results provided.  - Avoidance measures provided. - Continue with: over the counter antihistamines 1-2 times daily - Start taking: Singulair (montelukast) 10mg  daily and Flonase (fluticasone) one spray per nostril daily - Singulair can cause irritability and weird dreams, but this is RARE (call us if this happens). - It can also cause depression, so beware of that as well.  - You can use  an extra dose of the antihistamine, if needed, for breakthrough symptoms.  - Consider nasal saline rinses 1-2 times daily to remove allergens from the nasal cavities as well as help with mucous clearance (this is especially helpful to do before the nasal sprays are given) - Consider allergy shots as a means of long-term control. - Allergy shots "re-train" and "reset" the immune system to ignore environmental allergens and decrease the resulting immune response to those allergens (sneezing, itchy watery eyes, runny nose, nasal congestion, etc).    - Allergy shots improve symptoms in 75-85% of patients.  - We can discuss more at the next appointment if the medications are not working for you.  3. Return in about 4 weeks (around 10/13/2020).     This note in its entirety was forwarded to the Provider who requested this consultation.  Subjective:   [name] is a 60 y.o. female presenting today for evaluation of  Chief Complaint  Patient presents with   Allergy Testing    Pineapple - itchy throat, and closes, mouth itches, and acidic foods causes tiny white bumps on tongue was told it is a localized form of hives.    Asthma    Act -15    Allergic Rhinitis     Sneezing, itchy watery eyes, itching around her neck that gets dry and darkens. 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  [name] was referred by Doree Albee, MD.     [name]" is a 60 y.o. female presenting for an evaluation of asthma and allergies .    Asthma/Respiratory Symptom History:  She does have a history of asthma. She reports that this has gotten worse over the years. She has an emergency albuterol inhaler.  She was never on a daily inhaler to her knowledge. She might have had what sounds like Dulera in the past, but she did not use it consistently. She thinks that this was too expensive and she never picked it up. She reports that she survived on emergency inhalers provided by her brother, who is a English as a second language teacher and gets them for free.   Allergic Rhinitis Symptom History: She was on allergy shots for a long period of time. This was continued for several years.  She got shots from 2002 through 2003. She was doing well and then her allergist was no longer in network. She has been hobbling along with OTC allergy pills and allergy eye drops. She has been having worsening symptoms lately.   Eczema Symptom History: She does have eczema. She currently uses some over the counter ointments. The thing that really gives her relief is organic coconut oil. This is cold pressed and unrefined.  Flares are mostly in her thighs and her arms.  She has used a topical steroid in the past, but does not remember the name of it.  He has not required any steroids or antibiotics.  She is open to starting another steroids.  She is retired from [employer].  She started working there at age 32 and worked for 31 years.  She now spends her time taking care of her aging mother.  Otherwise, there is no history of other atopic diseases, including food allergies, drug allergies, stinging insect allergies, urticaria, or contact dermatitis. There is no significant infectious history. Vaccinations are up to date.    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 If you have any questions, ask your nurse or doctor.          ALLERGY PO Take 25 mg by mouth daily.   ArmonAir Digihaler 232 MCG/ACT Aepb Generic drug: Fluticasone Propionate(sensor) Inhale 1 puff into the lungs in the morning and at bedtime. Started by: Valentina Shaggy, MD   aspirin EC 81 MG tablet Take 81 mg by mouth daily. Swallow whole.   DULoxetine 60 MG capsule Commonly known as:  CYMBALTA Take 1 capsule (60 mg total) by mouth daily.   fluticasone 50 MCG/ACT nasal spray Commonly known as: Flonase Place 1 spray into both nostrils daily. Started by: Valentina Shaggy, MD   Hyaluronic Acid 100 MG Caps Take 50 mg by mouth daily.   hydrochlorothiazide 25 MG tablet Commonly known as: HYDRODIURIL Take 1 tablet (25 mg total) by mouth daily.   montelukast 10 MG tablet Commonly known as: Singulair Take 1 tablet (10 mg total) by mouth at bedtime. Started by: Valentina Shaggy, MD   MSM PO Take 4,500 mg by mouth daily.   multivitamin with minerals tablet Take 1 tablet by mouth daily. Vitamin D3 is in it (1,000Ius)   OVER THE COUNTER MEDICATION Take by mouth daily. Sugar Blockers   potassium chloride SA 20 MEQ tablet Commonly known as: KLOR-CON Take 20 mEq by mouth daily.   ProAir Digihaler 108 (90 Base) MCG/ACT Aepb Generic drug: Albuterol Sulfate (sensor) Inhale 2 puffs into the lungs every 4 (four) hours as needed. Started by: Valentina Shaggy, MD   PROBIOTIC-10 PO Take 10 mg by mouth daily.   Theanine 200 MG Caps Take 200 mg by  mouth daily.        Birth History: non-contributory  Developmental History: non-contributory  Past Surgical History: Past Surgical History:  Procedure Laterality Date   ABDOMINAL HYSTERECTOMY  09/2005   still have ovaries   BIOPSY N/A 09/08/2014   Procedure: BIOPSY random colon;  Surgeon: Danie Binder, MD;  Location: [facility];  Service: Endoscopy;  Laterality: N/A;   BREAST SURGERY Left 09/2007   lumpectomy   COLONOSCOPY WITH PROPOFOL N/A 09/08/2014   Procedure: COLONOSCOPY WITH PROPOFOL; in cecum at 0904; withdrawal time 16 minutes;  Surgeon: Danie Binder, MD;  Location: [facility];  Service: Endoscopy;  Laterality: N/A;     Family History: Family History  Problem Relation Age of Onset   Diabetes Mother    Stroke Mother    Hypertension Mother    Diabetes Father    Cancer Paternal Aunt        ovarian   Hypertension Sister    Hypertension Brother    Cancer Cousin        colon   Colon cancer Other        no first degree relatives     Social History: Shelbylynn lives at home with her family.  She lives in a house that is 58 years old.  There is wood flooring throughout the home with rub.  She has gas heating and central cooling.  There are no animals inside or outside of the home.  She does not have dust mite covers on her bedding.  There is no tobacco exposure.  She is not exposed to fumes, chemicals, or dust.  She does not use a HEPA filter.   Review of Systems  Constitutional:  Negative for chills, fever, malaise/fatigue and weight loss.  HENT:  Positive for congestion. Negative for ear discharge, ear pain and sore throat.   Eyes:  Negative for pain, discharge and redness.  Respiratory:  Positive for cough and wheezing. Negative for sputum production and shortness of breath.   Cardiovascular: Negative.  Negative for chest pain and palpitations.  Gastrointestinal:  Negative for abdominal pain, constipation, diarrhea, heartburn, nausea and vomiting.  Skin: Negative.  Negative  for itching and rash.  Neurological:  Negative for dizziness and headaches.  Endo/Heme/Allergies:  Positive for environmental allergies. Does not bruise/bleed easily.  Objective:   Blood pressure 140/90, pulse (!) 106, temperature (!) 97.5 F (36.4 C), resp. rate 18, height 5\' 9"  (1.753 m), weight 270 lb 6.4 oz (122.7 kg), SpO2 97 %. Body mass index is 39.93 kg/m.   Physical Exam:   Physical Exam Constitutional:      Appearance: She is well-developed.     Comments: Very pleasant and amusing.  HENT:     Head: Normocephalic and atraumatic.     Right Ear: Tympanic membrane, ear canal and external ear normal. No drainage, swelling or tenderness. Tympanic membrane is not injected, scarred, erythematous, retracted or bulging.     Left Ear: Tympanic membrane, ear canal and external ear normal. No drainage, swelling or tenderness. Tympanic membrane is not injected, scarred, erythematous, retracted or bulging.     Nose: No nasal deformity, septal deviation, mucosal edema or rhinorrhea.     Right Turbinates: Enlarged and swollen.     Left Turbinates: Enlarged and swollen.     Right Sinus: No maxillary sinus tenderness or frontal sinus tenderness.     Left Sinus: No maxillary sinus tenderness or frontal sinus tenderness.     Comments: No nasal polyps.    Mouth/Throat:     Lips: Pink.     Mouth: Mucous membranes are moist. Mucous membranes are not pale and not dry.     Pharynx: Uvula midline.     Comments: Cobblestoning in the posterior oropharynx. Eyes:     General:        Right eye: No discharge.        Left eye: No discharge.     Conjunctiva/sclera: Conjunctivae normal.     Right eye: Right conjunctiva is not injected. No chemosis.    Left eye: Left conjunctiva is not injected. No chemosis.    Pupils: Pupils are equal, round, and reactive to light.  Cardiovascular:     Rate and Rhythm: Normal rate and regular rhythm.     Heart sounds: Normal heart sounds.  Pulmonary:      Effort: Pulmonary effort is normal. No tachypnea, accessory muscle usage or respiratory distress.     Breath sounds: Normal breath sounds. No wheezing, rhonchi or rales.     Comments: Moving air well in all lung fields.  No increased work of breathing. Chest:     Chest wall: No tenderness.  Abdominal:     Tenderness: There is no abdominal tenderness. There is no guarding or rebound.  Lymphadenopathy:     Head:     Right side of head: No submandibular, tonsillar or occipital adenopathy.     Left side of head: No submandibular, tonsillar or occipital adenopathy.     Cervical: No cervical adenopathy.  Skin:    Coloration: Skin is not pale.     Findings: No abrasion, erythema, petechiae or rash. Rash is not papular, urticarial or vesicular.  Neurological:     Mental Status: She is alert.  Psychiatric:        Behavior: Behavior is cooperative.     Diagnostic studies:    Spirometry: not performed since machine was not functioning   Allergy Studies:     Airborne Adult Perc - 09/15/20 0937     Time Antigen Placed 0981    Allergen Manufacturer Lavella Hammock    Location Back    Number of Test 59    Panel 1 Select    1. Control-Buffer 50% Glycerol Negative    2. Control-Histamine 1 mg/ml 2+    3. Albumin saline  Negative    4. Conception Negative    5. [place] Negative    6. Johnson Negative    7. Biggers Blue Negative    8. Meadow Fescue Negative    9. Perennial Rye Negative    10. Sweet Vernal Negative    11. Timothy Negative    12. Cocklebur Negative    13. Burweed Marshelder Negative    14. Ragweed, short Negative    15. Ragweed, Giant Negative    16. Plantain,  English Negative    17. Lamb's Quarters Negative    18. Sheep Sorrell Negative    19. Rough Pigweed Negative    20. Marsh Elder, Rough Negative    21. Mugwort, Common Negative    22. Ash mix Negative    23. Birch mix Negative    24. Beech American Negative    25. Box, Elder Negative    26. Cedar, red Negative    27.  Cottonwood, [place] Negative    28. Elm mix Negative    29. Hickory Negative    30. Maple mix Negative    31. Oak, [place] mix Negative    32. Pecan Pollen Negative    33. Pine mix Negative    34. Sycamore Eastern Negative    35. [place], Black Pollen Negative    36. Alternaria alternata Negative    37. Cladosporium Herbarum Negative    38. Aspergillus mix Negative    39. Penicillium mix Negative    40. Bipolaris sorokiniana (Helminthosporium) Negative    41. Drechslera spicifera (Curvularia) Negative    42. Mucor plumbeus Negative    43. Fusarium moniliforme Negative    44. Aureobasidium pullulans (pullulara) Negative    45. Rhizopus oryzae Negative    46. Botrytis cinera Negative    47. Epicoccum nigrum Negative    48. Phoma betae Negative    49. Candida Albicans Negative    50. Trichophyton mentagrophytes Negative    51. Mite, D Farinae  5,000 AU/ml Negative    52. Mite, D Pteronyssinus  5,000 AU/ml Negative    53. Cat Hair 10,000 BAU/ml Negative    54.  Dog Epithelia Negative    55. Mixed Feathers Negative    56. Horse Epithelia Negative    57. Cockroach, German Negative    58. Mouse Negative    59. Tobacco Leaf Negative             Intradermal - 09/15/20 1018     Time Antigen Placed 1018    Allergen Manufacturer Greer    Location Arm    Number of Test 15    Control Negative    [place] Negative    Johnson Negative    7 Grass Negative    Ragweed mix 4+    Weed mix Negative    Tree mix Negative    Mold 1 1+    Mold 2 Negative    Mold 3 Negative    Mold 4 1+    Cat Negative    Dog Negative    Cockroach Negative    Mite mix Negative             Food Adult Perc - 09/15/20 0900     Time Antigen Placed 7322    Allergen Manufacturer Lavella Hammock    Location Back    Number of allergen test 2    Control-Histamine 1 mg/ml 2+    63. Pineapple Negative  Allergy testing results were read and interpreted by myself, documented by clinical  staff.         Salvatore Marvel, MD [facility]

## 2020-10-05 ENCOUNTER — Ambulatory Visit (HOSPITAL_COMMUNITY)
Admission: RE | Admit: 2020-10-05 | Discharge: 2020-10-05 | Disposition: A | Discharge status: Home / Self Care | Payer: 59 | Source: Ambulatory Visit | Attending: Nurse Practitioner | Admitting: Nurse Practitioner

## 2020-10-05 ENCOUNTER — Encounter: Payer: Self-pay | Admitting: Adult Health

## 2020-10-05 ENCOUNTER — Other Ambulatory Visit: Payer: Self-pay

## 2020-10-05 ENCOUNTER — Ambulatory Visit (INDEPENDENT_AMBULATORY_CARE_PROVIDER_SITE_OTHER): Payer: 59 | Admitting: Adult Health

## 2020-10-05 ENCOUNTER — Other Ambulatory Visit (HOSPITAL_COMMUNITY)
Admission: RE | Admit: 2020-10-05 | Discharge: 2020-10-05 | Disposition: A | Discharge status: Home / Self Care | Payer: 59 | Source: Ambulatory Visit | Attending: Advanced Practice Midwife | Admitting: Advanced Practice Midwife

## 2020-10-05 ENCOUNTER — Other Ambulatory Visit (HOSPITAL_COMMUNITY): Payer: Self-pay | Admitting: Nurse Practitioner

## 2020-10-05 VITALS — BP 124/87 | HR 97 | Ht 68.0 in | Wt 266.0 lb

## 2020-10-05 DIAGNOSIS — Z01419 Encounter for gynecological examination (general) (routine) without abnormal findings: Secondary | ICD-10-CM | POA: Insufficient documentation

## 2020-10-05 DIAGNOSIS — Z1211 Encounter for screening for malignant neoplasm of colon: Secondary | ICD-10-CM | POA: Insufficient documentation

## 2020-10-05 DIAGNOSIS — R928 Other abnormal and inconclusive findings on diagnostic imaging of breast: Secondary | ICD-10-CM

## 2020-10-05 DIAGNOSIS — Z90711 Acquired absence of uterus with remaining cervical stump: Secondary | ICD-10-CM | POA: Insufficient documentation

## 2020-10-05 DIAGNOSIS — R140 Abdominal distension (gaseous): Secondary | ICD-10-CM | POA: Insufficient documentation

## 2020-10-05 LAB — HEMOCCULT GUIAC POC 1CARD (OFFICE): Fecal Occult Blood, POC: NEGATIVE

## 2020-10-05 NOTE — Progress Notes (Signed)
Patient ID: [name], female   DOB: 15-Dec-1960, 60 y.o.   MRN: [id number] History of Present Illness: [name] is a 60 year old black female, divorced, G0P0, sp SCH,in for a well woman gyn exam and pap. She is a new pt. She is complaining of bloating and feeling full, and has more stomach than usual. PCP is Dr Anastasio Champion.   Current Medications, Allergies, Past Medical History, Past Surgical History, Family History and Social History were reviewed in [employer] record.     Review of Systems: Patient denies any headaches, hearing loss, fatigue, blurred vision, shortness of breath, chest pain, abdominal pain, problems with bowel movements, urination, or intercourse(not currently active). No joint pain or mood swings.  +bloating and feeling full,can drink sweet tea and feels like this    Physical Exam:BP 124/87 (BP Location: Left Arm, Patient Position: Sitting, Cuff Size: Large)   Pulse 97   Ht '5\' 8"'$  (1.727 m)   Wt 266 lb (120.7 kg)   BMI 40.45 kg/m   General:  Well developed, well nourished, no acute distress Skin:  Warm and dry Neck:  Midline trachea, normal thyroid, good ROM, no lymphadenopathy Lungs; Clear to auscultation bilaterally Breast:  No dominant palpable mass, retraction, or nipple discharge Cardiovascular: Regular rate and rhythm Abdomen:  Soft, non tender, no hepatosplenomegaly Pelvic:  External genitalia is normal in appearance, no lesions.  The vagina is normal in appearance. Urethra has no lesions or masses. The cervix is smooth, pap with GC/CHL and HR HPV genotyping performed.  Uterus is absent.  No adnexal masses, LLQ tenderness noted.Bladder is non tender, no masses felt. Rectal: Good sphincter tone, no polyps, or hemorrhoids felt.  Hemoccult negative. Extremities/musculoskeletal:  No swelling or varicosities noted, no clubbing or cyanosis Psych:  No mood changes, alert and cooperative,seems happy AA is 0  Fall risk is low Depression  screen PHQ 2/9 10/05/2020  Decreased Interest 0  Down, Depressed, Hopeless 1  PHQ - 2 Score 1  Altered sleeping 1  Tired, decreased energy 1  Change in appetite 1  Feeling bad or failure about yourself  0  Trouble concentrating 0  Moving slowly or fidgety/restless 0  Suicidal thoughts 0  PHQ-9 Score 4    GAD 7 : Generalized Anxiety Score 10/05/2020  Nervous, Anxious, on Edge 1  Control/stop worrying 0  Worry too much - different things 1  Trouble relaxing 0  Restless 0  Easily annoyed or irritable 0  Afraid - awful might happen 0  Total GAD 7 Score 2      Upstream - 10/05/20 1426       Pregnancy Intention Screening   Does the patient want to become pregnant in the next year? N/A    Does the patient's partner want to become pregnant in the next year? N/A    Would the patient like to discuss contraceptive options today? N/A      Contraception Wrap Up   Current Method Female Sterilization   hyst   End Method Female Sterilization   hyst   Contraception Counseling Provided No            Examination chaperoned by Levy Pupa LPN   Impression and Plan: 1. Encounter for gynecological examination with Papanicolaou smear of cervix Pap sent Pap in 3 years if normal Physical with PCP Mammogram today - Cytology - PAP( [facility]) Colonoscopy per GI  2. Encounter for screening fecal occult blood testing  - POCT occult blood stool  3. Abdominal bloating [place] scheduled for 8/1 at [facility] at 12:30 pm, will talk when results back, if no gyn problems then will need to see GI  - US PELVIC COMPLETE WITH TRANSVAGINAL; Future  4. S/P abdominal supracervical subtotal hysterectomy - US PELVIC COMPLETE WITH TRANSVAGINAL; Future

## 2020-10-08 ENCOUNTER — Other Ambulatory Visit (HOSPITAL_COMMUNITY): Payer: Self-pay | Admitting: Internal Medicine

## 2020-10-08 ENCOUNTER — Ambulatory Visit (HOSPITAL_COMMUNITY)
Admission: RE | Admit: 2020-10-08 | Discharge: 2020-10-08 | Disposition: A | Discharge status: Home / Self Care | Payer: 59 | Source: Ambulatory Visit | Attending: Nurse Practitioner | Admitting: Nurse Practitioner

## 2020-10-08 ENCOUNTER — Ambulatory Visit (HOSPITAL_COMMUNITY)
Admission: RE | Admit: 2020-10-08 | Discharge: 2020-10-08 | Disposition: A | Discharge status: Home / Self Care | Payer: 59 | Source: Ambulatory Visit | Attending: Internal Medicine | Admitting: Internal Medicine

## 2020-10-08 ENCOUNTER — Other Ambulatory Visit: Payer: Self-pay

## 2020-10-08 DIAGNOSIS — R928 Other abnormal and inconclusive findings on diagnostic imaging of breast: Secondary | ICD-10-CM | POA: Diagnosis not present

## 2020-10-08 LAB — CYTOLOGY - PAP
Chlamydia: NEGATIVE
Comment: NEGATIVE
Comment: NEGATIVE
Comment: NORMAL
Diagnosis: NEGATIVE
High risk HPV: NEGATIVE
Neisseria Gonorrhea: NEGATIVE

## 2020-10-08 MED ORDER — LIDOCAINE-EPINEPHRINE (PF) 2 %-1:200000 IJ SOLN
INTRAMUSCULAR | Status: AC
  Filled 2020-10-08: qty 10

## 2020-10-11 ENCOUNTER — Ambulatory Visit (HOSPITAL_COMMUNITY)
Admission: RE | Admit: 2020-10-11 | Discharge: 2020-10-11 | Disposition: A | Discharge status: Home / Self Care | Payer: 59 | Source: Ambulatory Visit | Attending: Adult Health | Admitting: Adult Health

## 2020-10-11 ENCOUNTER — Other Ambulatory Visit: Payer: Self-pay

## 2020-10-11 DIAGNOSIS — R140 Abdominal distension (gaseous): Secondary | ICD-10-CM | POA: Diagnosis present

## 2020-10-11 DIAGNOSIS — Z90711 Acquired absence of uterus with remaining cervical stump: Secondary | ICD-10-CM | POA: Insufficient documentation

## 2020-10-11 HISTORY — PX: BREAST BIOPSY: SHX20

## 2020-10-11 LAB — SURGICAL PATHOLOGY

## 2020-10-13 ENCOUNTER — Ambulatory Visit: Payer: 59 | Admitting: Family

## 2020-11-12 ENCOUNTER — Other Ambulatory Visit: Payer: Self-pay | Admitting: Allergy & Immunology

## 2020-11-17 ENCOUNTER — Ambulatory Visit: Payer: 59 | Admitting: Allergy & Immunology

## 2020-12-08 ENCOUNTER — Encounter (INDEPENDENT_AMBULATORY_CARE_PROVIDER_SITE_OTHER): Payer: 59 | Admitting: Nurse Practitioner

## 2021-02-25 ENCOUNTER — Other Ambulatory Visit (INDEPENDENT_AMBULATORY_CARE_PROVIDER_SITE_OTHER): Payer: Self-pay | Admitting: Nurse Practitioner

## 2021-02-25 DIAGNOSIS — F32A Depression, unspecified: Secondary | ICD-10-CM

## 2021-03-16 ENCOUNTER — Other Ambulatory Visit: Payer: Self-pay

## 2021-03-16 ENCOUNTER — Encounter: Payer: Self-pay | Admitting: Allergy & Immunology

## 2021-03-16 ENCOUNTER — Ambulatory Visit (INDEPENDENT_AMBULATORY_CARE_PROVIDER_SITE_OTHER): Payer: 59 | Admitting: Allergy & Immunology

## 2021-03-16 VITALS — BP 152/94 | HR 98 | Temp 98.7°F | Resp 18 | Ht 69.0 in | Wt 272.6 lb

## 2021-03-16 DIAGNOSIS — J3089 Other allergic rhinitis: Secondary | ICD-10-CM

## 2021-03-16 DIAGNOSIS — F32A Depression, unspecified: Secondary | ICD-10-CM

## 2021-03-16 DIAGNOSIS — J4531 Mild persistent asthma with (acute) exacerbation: Secondary | ICD-10-CM

## 2021-03-16 DIAGNOSIS — L2089 Other atopic dermatitis: Secondary | ICD-10-CM

## 2021-03-16 DIAGNOSIS — J302 Other seasonal allergic rhinitis: Secondary | ICD-10-CM

## 2021-03-16 MED ORDER — BREZTRI AEROSPHERE 160-9-4.8 MCG/ACT IN AERO: 2.0000 | INHALATION_SPRAY | Freq: Two times a day (BID) | RESPIRATORY_TRACT | 5 refills | Status: DC

## 2021-03-16 MED ORDER — ALBUTEROL SULFATE HFA 108 (90 BASE) MCG/ACT IN AERS: 2.0000 | INHALATION_SPRAY | Freq: Four times a day (QID) | RESPIRATORY_TRACT | 1 refills | Status: DC | PRN

## 2021-03-16 MED ORDER — OMEPRAZOLE 40 MG PO CPDR: 40.0000 mg | DELAYED_RELEASE_CAPSULE | Freq: Every day | ORAL | 1 refills | Status: DC

## 2021-03-16 MED ORDER — DULOXETINE HCL 60 MG PO CPEP: 60.0000 mg | ORAL_CAPSULE | Freq: Every day | ORAL | 0 refills | Status: DC

## 2021-03-16 MED ORDER — BUDESONIDE-FORMOTEROL FUMARATE 160-4.5 MCG/ACT IN AERO: 2.0000 | INHALATION_SPRAY | Freq: Two times a day (BID) | RESPIRATORY_TRACT | 1 refills | Status: DC

## 2021-03-16 NOTE — Patient Instructions (Addendum)
1. Mild persistent asthma with acute exacerbation - Lung testing was slightly low, but it did improve with the albuterol treatment. - We are going to start you on a prednisone taper to try to get things under control.  - We are going to start you on Breztri two puffs twice daily (contains three medications to help with breathing). - Spacer and demonstration provided.  - Daily controller medication(s): Breztri two puffs twice daily with spacer - Prior to physical activity: ProAir Digihaler 2 puffs 10-15 minutes before physical activity. - Rescue medications: ProAir Digihaler 4 puffs every 4-6 hours as needed - Changes during respiratory infections or worsening symptoms: Increase Armonair 21mcg to 2 puffs twice daily for TWO WEEKS. - Asthma control goals:  * Full participation in all desired activities (may need albuterol before activity) * Albuterol use two time or less a week on average (not counting use with activity) * Cough interfering with sleep two time or less a month * Oral steroids no more than once a year * No hospitalizations.  2. Chronic rhinitis (ragweed, indoor molds, and outdoor molds - Continue with: over the counter antihistamines 1-2 times daily - Continue taking: Flonase (fluticasone) one spray per nostril daily  3. GERD  - Start omeprazole 40mg  daily to help with reflux.   4. Health maintenance - I did send in a 3 month supply of duloxetine until you get in to see a new PCP. - Options include Dr. Wolfgang Phoenix, Dr. Hilma Favors, or Dr. Moshe Cipro.   5. Return in about 6 weeks (around 04/27/2021).    Please inform us of any Emergency Department visits, hospitalizations, or changes in symptoms. Call us before going to the ED for breathing or allergy symptoms since we might be able to fit you in for a sick visit. Feel free to contact us anytime with any questions, problems, or concerns.  It was a pleasure to see you again today!  Websites that have reliable patient information: 1.  American Academy of Asthma, Allergy, and Immunology: www.aaaai.org 2. Food Allergy Research and Education (FARE): foodallergy.org 3. Mothers of Asthmatics: http://www.asthmacommunitynetwork.org 4. American College of Allergy, Asthma, and Immunology: www.acaai.org   COVID-19 Vaccine Information can be found at: ShippingScam.co.uk For questions related to vaccine distribution or appointments, please email [email or web address] or call [phone].   We realize that you might be concerned about having an allergic reaction to the COVID19 vaccines. To help with that concern, WE ARE OFFERING THE COVID19 VACCINES IN OUR OFFICE! Ask the front desk for dates!     Like [place] on [employer] [place] and Instagram for our latest updates!      A healthy democracy works best when [employer] participate! Make sure you are registered to vote! If you have moved or changed any of your contact information, you will need to get this updated before voting!  In some cases, you MAY be able to register to vote online: [email or web address]    Control Negative   [place] Negative   Johnson Negative   7 Grass Negative   Ragweed mix 4+   Weed mix Negative   Tree mix Negative   Mold 1 1+   Mold 2 Negative   Mold 3 Negative   Mold 4 1+   Cat Negative   Dog Negative   Cockroach Negative   Mite mix Negative

## 2021-03-16 NOTE — Progress Notes (Signed)
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 Mild persistent asthma with acute exacerbation - Lung testing was slightly low, but it did improve with the albuterol treatment. - We are going to start you on a prednisone taper to try to get things under control.  - We are going to start you on Breztri two puffs twice daily (contains three medications to help with breathing). - Spacer and demonstration provided.  - Daily controller medication(s): Breztri two puffs twice daily with spacer - Prior to physical activity: ProAir Digihaler 2 puffs 10-15 minutes before physical activity. - Rescue medications: ProAir Digihaler 4 puffs every 4-6 hours as needed - Changes during respiratory infections or worsening symptoms: Increase Armonair 237mcg to 2 puffs twice daily for TWO WEEKS. - Asthma control goals:  * Full participation in all desired activities (may need albuterol before activity) * Albuterol use two time or less a week on average (not counting use with activity) * Cough interfering with sleep two time or less a month * Oral steroids no more than once a year * No hospitalizations.  2. Chronic rhinitis (ragweed, indoor molds, and outdoor molds - Continue with: over the counter antihistamines 1-2 times daily - Continue taking: Flonase (fluticasone) one spray per nostril daily  3. GERD  - Start omeprazole 40mg  daily to help with reflux.   4. Health maintenance - I did send in a 3 month supply of duloxetine until you get in to see a new PCP. - Options include Dr. Wolfgang Phoenix, Dr. Hilma Favors, or Dr. Moshe Cipro.   5. Return in about 6  weeks (around 04/27/2021).   Subjective:   [name] is a 61 y.o. female presenting today for follow up of  Chief Complaint  Patient presents with   Cough    X 2wks - whitish clear. Patient states she has been taking OTC Mucinex DM and Coricidin for some relief   Wheezing    X 4 1/2 wks - has gotten worse   Asthma    Has gotten worse since LOV.   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  [name] is a 61 y.o. female presenting for a follow up visit.  Last seen in July 2022.  At that time, we did not do lung testing because the lung test was not working.  We started her on an inhaled steroid (Armonair 231mcg) 1 puff twice daily to see if this would help with her symptoms.  For her rhinitis, she had testing that was positive to ragweed,  indoor molds, and outdoor molds.  We started her on Singulair as well as Flonase.  We continued her on her over-the-counter antihistamines.  Since last visit, she has has a number of issues. Her mother passed away and now she is living with her 24yo father. 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   Asthma/Respiratory Symptom History: She has been coughing a lot since the last visit. She  has been coughing through the day every day. She is unable to sing, laugh, talk for long periods of time, and "wheezing like  Darth Vader". She has been using her emergency inhaler and it was helping initially. Now, she will have issues when it starts helping for only a shorter period time.    She has been on duloxetine 60mg  once daily for her menopausal hot flashes.  She is wondering if I can prescribe her a couple of months until she can establish care with a new PCP.  Her previous PCP passed [place] via heart tach [place]  This was the summer 2022 just before she was set to establish care with him.  Allergic Rhinitis Symptom History: She remains on an over-the-counter antihistamine.  She has a nasal spray but has not been using it.  GERD Symptom History: She does report a history of reflux.  She has not reflux medication in quite some time.  Otherwise, there have been no changes to her past medical history, surgical history, family history, or social history.    Review of Systems  Constitutional: Negative.  Negative for fever, malaise/fatigue and weight loss.  HENT:  Positive for congestion. Negative for ear discharge and ear pain.   Eyes:  Negative for pain, discharge and redness.  Respiratory:  Positive for cough and shortness of breath. Negative for sputum production and wheezing.   Cardiovascular: Negative.  Negative for chest pain and palpitations.  Gastrointestinal:  Negative for abdominal pain, heartburn, nausea and vomiting.  Skin: Negative.  Negative for itching and rash.  Neurological:  Negative for dizziness and headaches.  Endo/Heme/Allergies:  Negative for environmental allergies. Does not bruise/bleed easily.  All other systems reviewed and are negative.     Objective:   Blood pressure (!) 152/94, pulse 98, temperature 98.7 F (37.1 C), resp. rate 18, height 5\' 9"  (1.753 m), weight 272 lb 9.6 oz (123.7 kg), SpO2 97 %. Body mass index is 40.26 kg/m.   Physical Exam:  Physical Exam Vitals reviewed.  Constitutional:      Appearance: She is well-developed.     Comments: Very  pleasant and amusing.  HENT:     Head: Normocephalic and atraumatic.     Right Ear: Tympanic membrane, ear canal and external ear normal. No drainage, swelling or tenderness. Tympanic membrane is not injected, scarred, erythematous, retracted or bulging.     Left Ear: Tympanic membrane, ear canal and external ear normal. No drainage, swelling or tenderness. Tympanic membrane is not injected, scarred, erythematous, retracted or bulging.     Nose: No nasal deformity, septal deviation, mucosal edema or rhinorrhea.     Right Turbinates: Enlarged and swollen.     Left Turbinates: Enlarged and swollen.     Right Sinus: No maxillary sinus tenderness or frontal sinus tenderness.     Left Sinus: No maxillary sinus tenderness or frontal sinus tenderness.     Comments: No nasal polyps.    Mouth/Throat:     Lips: Pink.     Mouth: Mucous membranes are moist. Mucous membranes are not  pale and not dry.     Pharynx: Uvula midline.     Comments: Cobblestoning in the posterior oropharynx. Eyes:     General:        Right eye: No discharge.        Left eye: No discharge.     Conjunctiva/sclera: Conjunctivae normal.     Right eye: Right conjunctiva is not injected. No chemosis.    Left eye: Left conjunctiva is not injected. No chemosis.    Pupils: Pupils are equal, round, and reactive to light.  Cardiovascular:     Rate and Rhythm: Normal rate and regular rhythm.     Heart sounds: Normal heart sounds.  Pulmonary:     Effort: Pulmonary effort is normal. No tachypnea, accessory muscle usage or respiratory distress.     Breath sounds: Normal breath sounds. No wheezing, rhonchi or rales.     Comments: Moving air well in all lung fields.  No increased work of breathing. Chest:     Chest wall: No tenderness.  Lymphadenopathy:     Head:     Right side of head: No submandibular, tonsillar or occipital adenopathy.     Left side of head: No submandibular, tonsillar or occipital adenopathy.     Cervical: No  cervical adenopathy.  Skin:    Coloration: Skin is not pale.     Findings: No abrasion, erythema, petechiae or rash. Rash is not papular, urticarial or vesicular.  Neurological:     Mental Status: She is alert.  Psychiatric:        Behavior: Behavior is cooperative.     Diagnostic studies:    Spirometry: results abnormal (FEV1: 1.90/74%, FVC: 2.18/67%, FEV1/FVC: 87%).    Spirometry consistent with possible restrictive disease. Xopenex four puffs via MDI treatment given in clinic with improvement in FVC, but not significant per ATS criteria.  Allergy Studies: none        Salvatore Marvel, MD  [facility]

## 2021-04-21 ENCOUNTER — Other Ambulatory Visit (INDEPENDENT_AMBULATORY_CARE_PROVIDER_SITE_OTHER): Payer: Self-pay | Admitting: Nurse Practitioner

## 2021-04-21 DIAGNOSIS — I10 Essential (primary) hypertension: Secondary | ICD-10-CM

## 2021-04-27 ENCOUNTER — Other Ambulatory Visit: Payer: Self-pay

## 2021-04-27 ENCOUNTER — Encounter: Payer: Self-pay | Admitting: Allergy & Immunology

## 2021-04-27 ENCOUNTER — Ambulatory Visit: Payer: 59 | Admitting: Allergy & Immunology

## 2021-04-27 VITALS — BP 138/76 | HR 98 | Temp 97.7°F | Resp 18

## 2021-04-27 DIAGNOSIS — L2089 Other atopic dermatitis: Secondary | ICD-10-CM | POA: Diagnosis not present

## 2021-04-27 DIAGNOSIS — I10 Essential (primary) hypertension: Secondary | ICD-10-CM | POA: Diagnosis not present

## 2021-04-27 DIAGNOSIS — J3089 Other allergic rhinitis: Secondary | ICD-10-CM | POA: Diagnosis not present

## 2021-04-27 DIAGNOSIS — J302 Other seasonal allergic rhinitis: Secondary | ICD-10-CM

## 2021-04-27 DIAGNOSIS — J4530 Mild persistent asthma, uncomplicated: Secondary | ICD-10-CM

## 2021-04-27 DIAGNOSIS — J019 Acute sinusitis, unspecified: Secondary | ICD-10-CM

## 2021-04-27 MED ORDER — AZITHROMYCIN 250 MG PO TABS: ORAL_TABLET | ORAL | 0 refills | Status: DC

## 2021-04-27 MED ORDER — HYDROCHLOROTHIAZIDE 25 MG PO TABS: 25.0000 mg | ORAL_TABLET | Freq: Every day | ORAL | 0 refills | Status: DC

## 2021-04-27 NOTE — Progress Notes (Signed)
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 Mild persistent asthma, uncomplicated - Lung testing looked amazing today (EVEN BETTER than last time). - We are not going to make any changes at this time.  - Daily controller medication(s): Breztri two puffs twice daily with spacer  - Prior to physical activity: ProAir Digihaler 2 puffs 10-15 minutes before physical activity. - Rescue medications: ProAir Digihaler 4 puffs every 4-6 hours as needed - Asthma control goals:  * Full participation in all desired activities (may need albuterol before activity) * Albuterol use two time or less a week on average (not counting use with activity) * Cough interfering with sleep two time or less a month * Oral steroids no more than once a year * No hospitalizations.  2. Chronic rhinitis (ragweed, indoor molds, and outdoor molds) - Continue with: over the counter antihistamines 1-2 times daily - Continue taking: Flonase (fluticasone) one spray per nostril daily  3. Acute sinusitis - COVID testing was negative.  - We are going to send in an antibiotic to cover you for a sinus infection: azithromycin two tablets on the first day and then one tablet daily for four more days.  - We also need to do penicillin testing and a challenge at some point. - We should do other antibiotic challenges as well.   4. GERD  - Continue omeprazole 40mg  daily to help with reflux.   5. Health maintenance - I did send in a 3 month supply of HCTZ until you get in to see a new PCP. - Options include Dr. Wolfgang Phoenix, Dr. Hilma Favors, or Dr. Moshe Cipro.  - Please call to make an appointment.   6. Return in about 3 months (around 07/25/2021).      Subjective:   [name] is a 61 y.o. 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  [name] is a 61 y.o. female presenting for a follow up visit.  She was last seen in January 2023.  At that time, her lung testing was slightly low but improved with albuterol.  We put her on a prednisone taper to try to get her lung inflammation under control.  We started her on Breztri 2 puffs twice daily.  For her rhinitis, we continued with an over-the-counter antihistamine 1-2 times daily as well as Flonase.  For her presumed reflux, we started omeprazole 40 mg to help with that.  We also sent in a 42-month supply of her duloxetine until she establish care with another PCP, as her other one had passed away.  Since  last visit, she has mostly done well. She does report that she has had a couple of viral URIs.  She did well after we saw her for a couple of weeks, but then developed some sinus infection symptoms including nasal discharge and sinus pressure.  She did not get any antibiotics for it.  She was just in the sprays and some over-the-counter medications.  Asthma/Respiratory Symptom History: She remains on Breztri 2 puffs twice daily.  She has not been using her rescue inhaler too much.  He has not been to the  hospital nor she needed prednisone for her breathing.  Allergic Rhinitis Symptom History: She remains on antihistamines as well as the Flonase.  She does report some current illness, as above.  She has not tested for COVID-19.  She has not been febrile.  She has not needed antibiotics in quite some time.   Otherwise, there have been no changes to her past medical history, surgical history, family history, or social history.    Review of Systems  Constitutional: Negative.  Negative for fever, malaise/fatigue and weight loss.  HENT:  Positive for congestion. Negative for ear discharge and ear pain.   Eyes:  Negative for pain, discharge and redness.  Respiratory:  Positive for cough and shortness of breath. Negative for sputum production and wheezing.   Cardiovascular: Negative.  Negative for chest pain and palpitations.  Gastrointestinal:  Negative for abdominal pain, heartburn, nausea and vomiting.  Skin: Negative.  Negative for itching and rash.  Neurological:  Negative for dizziness and headaches.  Endo/Heme/Allergies:  Negative for environmental allergies. Does not bruise/bleed easily.  All other systems reviewed and are negative.     Objective:   Blood pressure 138/76, pulse 98, temperature 97.7 F (36.5 C), temperature source Temporal, resp. rate 18, SpO2 97 %. There is no height or weight on file to calculate BMI.    Physical Exam Vitals reviewed.  Constitutional:      Appearance: She is well-developed.  HENT:     Head: Normocephalic and atraumatic.     Right Ear: Tympanic membrane, ear canal and external ear normal.     Left Ear: Tympanic membrane, ear canal and external ear normal.     Nose: Mucosal edema and rhinorrhea present. No nasal deformity or septal deviation.     Right Turbinates: Enlarged and swollen.     Left Turbinates: Enlarged and swollen.     Right Sinus: Maxillary sinus tenderness present. No frontal sinus tenderness.     Left Sinus: Maxillary sinus  tenderness present. No frontal sinus tenderness.     Comments: No nasal polyps.     Mouth/Throat:     Mouth: Mucous membranes are not pale and not dry.     Pharynx: Uvula midline.     Tonsils: Tonsillar exudate present. 1+ on the right. 1+ on the left.  Eyes:     General: Lids are normal. No allergic shiner.       Right eye: No discharge.        Left eye: No discharge.     Conjunctiva/sclera: Conjunctivae normal.     Right eye: Right conjunctiva is not injected. No chemosis.    Left eye: Left conjunctiva is not injected. No chemosis.    Pupils: Pupils are equal, round, and reactive to light.  Cardiovascular:     Rate and Rhythm: Normal rate and regular rhythm.     Heart sounds: Normal heart sounds.  Pulmonary:     Effort: Pulmonary effort is  normal. No tachypnea, accessory muscle usage or respiratory distress.     Breath sounds: Normal breath sounds. No wheezing, rhonchi or rales.  Chest:     Chest wall: No tenderness.  Lymphadenopathy:     Cervical: No cervical adenopathy.  Skin:    Coloration: Skin is not pale.     Findings: No abrasion, erythema, petechiae or rash. Rash is not papular, urticarial or vesicular.  Neurological:     Mental Status: She is alert.  Psychiatric:        Behavior: Behavior is cooperative.     Diagnostic studies:    Spirometry: results normal (FEV1: 2.18/85%, FVC: 2.57/79%, FEV1/FVC: 85%).    Spirometry consistent with normal pattern.   Allergy Studies: none        Salvatore Marvel, MD  [facility]

## 2021-04-27 NOTE — Patient Instructions (Addendum)
1. Mild persistent asthma, uncomplicated - Lung testing looked amazing today (EVEN BETTER than last time). - We are not going to make any changes at this time.  - Daily controller medication(s): Breztri two puffs twice daily with spacer  - Prior to physical activity: ProAir Digihaler 2 puffs 10-15 minutes before physical activity. - Rescue medications: ProAir Digihaler 4 puffs every 4-6 hours as needed - Asthma control goals:  * Full participation in all desired activities (may need albuterol before activity) * Albuterol use two time or less a week on average (not counting use with activity) * Cough interfering with sleep two time or less a month * Oral steroids no more than once a year * No hospitalizations.  2. Chronic rhinitis (ragweed, indoor molds, and outdoor molds) - Continue with: over the counter antihistamines 1-2 times daily - Continue taking: Flonase (fluticasone) one spray per nostril daily  3. Acute sinusitis - COVID testing was negative.  - We are going to send in an antibiotic to cover you for a sinus infection: azithromycin two tablets on the first day and then one tablet daily for four more days.  - We also need to do penicillin testing and a challenge at some point. - We should do other antibiotic challenges as well.   4. GERD  - Continue omeprazole 40mg  daily to help with reflux.   5. Health maintenance - I did send in a 3 month supply of HCTZ until you get in to see a new PCP. - Options include Dr. Wolfgang Phoenix, Dr. Hilma Favors, or Dr. Moshe Cipro.  - Please call to make an appointment.   6. Return in about 3 months (around 07/25/2021).    Please inform us of any Emergency Department visits, hospitalizations, or changes in symptoms. Call us before going to the ED for breathing or allergy symptoms since we might be able to fit you in for a sick visit. Feel free to contact us anytime with any questions, problems, or concerns.  It was a pleasure to see you again  today!  Websites that have reliable patient information: 1. American Academy of Asthma, Allergy, and Immunology: www.aaaai.org 2. Food Allergy Research and Education (FARE): foodallergy.org 3. Mothers of Asthmatics: http://www.asthmacommunitynetwork.org 4. American College of Allergy, Asthma, and Immunology: www.acaai.org   COVID-19 Vaccine Information can be found at: ShippingScam.co.uk For questions related to vaccine distribution or appointments, please email [email or web address] or call [phone].   We realize that you might be concerned about having an allergic reaction to the COVID19 vaccines. To help with that concern, WE ARE OFFERING THE COVID19 VACCINES IN OUR OFFICE! Ask the front desk for dates!     Like [place] on [place] and Instagram for our latest updates!      A healthy democracy works best when [employer] participate! Make sure you are registered to vote! If you have moved or changed any of your contact information, you will need to get this updated before voting!  In some cases, you MAY be able to register to vote online: [email or web address]

## 2021-04-29 ENCOUNTER — Telehealth: Payer: Self-pay | Admitting: Allergy & Immunology

## 2021-04-29 NOTE — Telephone Encounter (Signed)
Patient states she reached out to the recommended doctor's for a new PCP; however they are not taking any new patients at the moment. Patient is wanting to know if there are any other PCP that Dr. Ernst Bowler recommends for her to see. Patient states she can be reached via phone, [phone] or mychart message.   Patient also states she has spoken to insurance regarding allergy injections and was told her injections would be approved. I advised patient to stop by office and bring signed paperwork so that she may be scheduled to start injections. Patient states she will stop by on Monday to drop off paperwork.

## 2021-04-29 NOTE — Telephone Encounter (Signed)
What about [facility]? That might be closer to her.   Salvatore Marvel, MD Allergy [facility]

## 2021-05-04 NOTE — Telephone Encounter (Signed)
Awesome sauce. Thanks for letting me know!

## 2021-08-03 ENCOUNTER — Encounter: Payer: Self-pay | Admitting: Allergy & Immunology

## 2021-08-03 ENCOUNTER — Ambulatory Visit: Payer: 59 | Admitting: Allergy & Immunology

## 2021-08-03 VITALS — BP 128/78 | HR 100 | Temp 98.2°F | Ht 68.0 in | Wt 260.1 lb

## 2021-08-03 DIAGNOSIS — L2089 Other atopic dermatitis: Secondary | ICD-10-CM

## 2021-08-03 DIAGNOSIS — J3089 Other allergic rhinitis: Secondary | ICD-10-CM

## 2021-08-03 DIAGNOSIS — J4530 Mild persistent asthma, uncomplicated: Secondary | ICD-10-CM | POA: Diagnosis not present

## 2021-08-03 DIAGNOSIS — J302 Other seasonal allergic rhinitis: Secondary | ICD-10-CM

## 2021-08-03 NOTE — Patient Instructions (Addendum)
1. Mild persistent asthma, uncomplicated - Lung testing looked good day. - We are not going to make any changes at this time.  - Daily controller medication(s): Breztri (or Symbicort) two puffs twice daily with spacer  - Prior to physical activity: ProAir Digihaler 2 puffs 10-15 minutes before physical activity. - Rescue medications: ProAir Digihaler 4 puffs every 4-6 hours as needed - Asthma control goals:  * Full participation in all desired activities (may need albuterol before activity) * Albuterol use two time or less a week on average (not counting use with activity) * Cough interfering with sleep two time or less a month * Oral steroids no more than once a year * No hospitalizations.  2. Chronic rhinitis (ragweed, indoor molds, and outdoor molds) - Continue with: over the counter antihistamines 1-2 times daily - Continue taking: Flonase (fluticasone) one spray per nostril daily - Allergy shot consent signed today.  - Make an appointment to start shots in 2-3 weeks.   3. GERD  - Continue omeprazole '40mg'$  daily to help with reflux.   4. Return in about 6 months (around 02/03/2022).    Please inform us of any Emergency Department visits, hospitalizations, or changes in symptoms. Call us before going to the ED for breathing or allergy symptoms since we might be able to fit you in for a sick visit. Feel free to contact us anytime with any questions, problems, or concerns.  It was a pleasure to see you again today!  Websites that have reliable patient information: 1. American Academy of Asthma, Allergy, and Immunology: www.aaaai.org 2. Food Allergy Research and Education (FARE): foodallergy.org 3. Mothers of Asthmatics: http://www.asthmacommunitynetwork.org 4. American College of Allergy, Asthma, and Immunology: www.acaai.org   COVID-19 Vaccine Information can be found at: ShippingScam.co.uk For questions related to  vaccine distribution or appointments, please email [email or web address] or call [phone].   We realize that you might be concerned about having an allergic reaction to the COVID19 vaccines. To help with that concern, WE ARE OFFERING THE COVID19 VACCINES IN OUR OFFICE! Ask the front desk for dates!     "Like" [place] on Facebook and Instagram for our latest updates!      A healthy democracy works best when [employer] participate! Make sure you are registered to vote! If you have moved or changed any of your contact information, you will need to get this updated before voting!  In some cases, you MAY be able to register to vote online: [email or web address]

## 2021-08-03 NOTE — Progress Notes (Signed)
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 Mild persistent asthma, uncomplicated - Lung testing looked good day. - We are not going to make any changes at this time.  - Daily controller medication(s): Breztri (or Symbicort) two puffs twice daily with spacer  - Prior to physical activity: ProAir Digihaler 2 puffs 10-15 minutes before physical activity. - Rescue medications: ProAir Digihaler 4 puffs every 4-6 hours as needed - Asthma control goals:  * Full participation in all desired activities (may need albuterol before activity) * Albuterol use two time or less a week on average (not counting use with activity) * Cough interfering with sleep two time or less a month * Oral steroids no more than once a year * No hospitalizations.  2. Chronic rhinitis (ragweed, indoor molds, and outdoor molds) - Continue with: over the counter antihistamines 1-2 times daily - Continue taking: Flonase (fluticasone) one spray per nostril daily - Allergy shot consent signed today.  - Make an appointment to start shots in 2-3 weeks.   3. GERD  - Continue omeprazole '40mg'$  daily to help with reflux.   4. Return in about 6 months (around 02/03/2022).    Subjective:   [name] is a 61 y.o. female presenting today for follow up of  Chief Complaint  Patient presents with   Asthma    Had Covid last month. Passed out from Covid. Still feel a little weak.  The spacer is helping a lot with her asthma she feels a difference.     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  [name] is a 61 y.o. female presenting for a follow up visit. She was last seen in February 2023.  At that time, we continue with Breztri 2 puffs twice daily as well as albuterol as needed.  For her allergic rhinitis, we continue with antihistamines as needed as well as Flonase.  We diagnosed her with acute sinusitis and sent in azithromycin to treat this.  For her GERD, we continue omeprazole.  We did give her a 15-monthcourse of HCTZ until she got a new PCP.  Since the last visit, she has done well. She finally got her old PCP back - Dr. NJoen Laura She originally left him because he was out of network at some point.   She did contract COVID [place] She reports that she was fairly sick and passed out in the bathroom during that time. She really just felt back to her baseline over the last few days. She reports that she has nnot gotten her strgneth back completely for 3 months or so.  Asthma/Respiratory Symptom History: She is on the [place] and she thinks that this is doing the trick.  She might be on Symbicort two puffs BID.  She apparently has both inhalers at home and is not sure which one she is using.  She does feel that the spacer is working very well.  She is very happy with how well she is doing.  Allergic Rhinitis Symptom History: Insurance will cover her allergy shots.  She is very interested in starting allergy shots.  She has not had any more sinus infections since I saw her last time.  She remains on her  Flonase and antihistamine.  She changes her antihistamine and takes up to 2 daily.  She is interested in starting allergy shots due to lack of symptom control.  She had her sister die in Apriul 2022. Then her mother died over the summer and a number of family friends died as well.  She just really did not have a good year last year.  Otherwise, there have been no changes to her past medical history, surgical history, family history, or social history.    Review of Systems  Constitutional: Negative.  Negative for chills, fever, malaise/fatigue and weight loss.  HENT:  Positive for congestion. Negative for ear discharge, ear pain and sinus pain.   Eyes:  Negative for pain, discharge and redness.  Respiratory:  Negative for cough, sputum production, shortness of breath and wheezing.   Cardiovascular: Negative.  Negative for chest pain and palpitations.  Gastrointestinal:  Negative for abdominal pain, constipation, diarrhea, heartburn, nausea and vomiting.  Skin: Negative.  Negative for itching and rash.  Neurological:  Negative for dizziness and headaches.  Endo/Heme/Allergies:  Positive for environmental allergies. Does not bruise/bleed easily.      Objective:   Blood pressure 128/78, pulse 100, temperature 98.2 F (36.8 C), height '5\' 8"'$  (1.727 m), weight 260 lb 2 oz (118 kg), SpO2 97 %. Body mass index is 39.55 kg/m.    Physical Exam Vitals reviewed.  Constitutional:      Appearance: She is well-developed.  HENT:     Head: Normocephalic and atraumatic.     Right Ear: Tympanic membrane, ear canal and external ear normal.     Left Ear: Tympanic membrane, ear canal and external ear normal.     Nose: Mucosal edema and rhinorrhea present. No nasal deformity or septal deviation.     Right Turbinates: Enlarged, swollen and pale.     Left Turbinates: Enlarged, swollen and pale.     Right Sinus: Maxillary sinus tenderness present. No frontal sinus tenderness.     Left Sinus: Maxillary  sinus tenderness present. No frontal sinus tenderness.     Comments: No nasal polyps.     Mouth/Throat:     Mouth: Mucous membranes are not pale and not dry.     Pharynx: Uvula midline.     Tonsils: Tonsillar exudate present. 1+ on the right. 1+ on the left.  Eyes:     General: Lids are normal. No allergic shiner.       Right eye: No discharge.        Left eye: No discharge.     Conjunctiva/sclera: Conjunctivae normal.     Right eye: Right conjunctiva is not injected. No chemosis.    Left eye: Left conjunctiva is not injected. No chemosis.    Pupils: Pupils are equal, round, and reactive to light.  Cardiovascular:     Rate and Rhythm: Normal rate and regular rhythm.  Heart sounds: Normal heart sounds.  Pulmonary:     Effort: Pulmonary effort is normal. No tachypnea, accessory muscle usage or respiratory distress.     Breath sounds: Normal breath sounds. No wheezing, rhonchi or rales.     Comments: Moving air well in all lung fields. Chest:     Chest wall: No tenderness.  Lymphadenopathy:     Cervical: No cervical adenopathy.  Skin:    Coloration: Skin is not pale.     Findings: No abrasion, erythema, petechiae or rash. Rash is not papular, urticarial or vesicular.  Neurological:     Mental Status: She is alert.  Psychiatric:        Behavior: Behavior is cooperative.     Diagnostic studies:    Spirometry: results normal (FEV1: 1.99/77%, FVC: 2.59/79%, FEV1/FVC: 77%).    Spirometry consistent with normal pattern.    Allergy Studies: none      Salvatore Marvel, MD  [facility]

## 2021-08-04 ENCOUNTER — Encounter: Payer: Self-pay | Admitting: Allergy & Immunology

## 2021-08-09 ENCOUNTER — Telehealth: Payer: Self-pay | Admitting: Allergy & Immunology

## 2021-08-09 DIAGNOSIS — J302 Other seasonal allergic rhinitis: Secondary | ICD-10-CM | POA: Diagnosis not present

## 2021-08-09 NOTE — Telephone Encounter (Signed)
Patient called to report that her PCP is not getting the notes from [place]. I will talk to [facility] and make sure that we have everything set up correctly.   Dee - can you e-fax my latest note to Dr. Karie Kirks?   I also confirmed her start injection appointment. She has it in her calendar now.   Salvatore Marvel, MD [facility]

## 2021-08-09 NOTE — Telephone Encounter (Signed)
Thanks much!  Breanah Faddis, MD [facility]  

## 2021-08-09 NOTE — Progress Notes (Signed)
Aeroallergen Immunotherapy   Ordering Provider: Dr. Salvatore Marvel   Patient Details  Name: [name]  MRN: [id number]  Date of Birth: 05-23-1960   Order 1 of 1   Vial Label: RW/Molds   0.4 ml (Volume)  1:20 Concentration -- Ragweed Mix  0.2 ml (Volume)  1:20 Concentration -- Alternaria alternata  0.2 ml (Volume)  1:20 Concentration -- Cladosporium herbarum  0.2 ml (Volume)  1:10 Concentration -- Fusarium moniliforme  0.2 ml (Volume)  1:40 Concentration -- Aureobasidium pullulans  0.2 ml (Volume)  1:10 Concentration -- Rhizopus oryzae    1.4  ml Extract Subtotal  3.6  ml Diluent  5.0  ml Maintenance Total   Schedule:  A   Blue Vial (1:100,000): Schedule A (10 doses)  Yellow Vial (1:10,000): Schedule A (10 doses)  Green Vial (1:1,000): Schedule A (10 doses)  Red Vial (1:100): Schedule A (10 doses)   Special Instructions: none

## 2021-08-09 NOTE — Progress Notes (Signed)
VIALS EXP 08-10-22 

## 2021-08-24 ENCOUNTER — Ambulatory Visit (INDEPENDENT_AMBULATORY_CARE_PROVIDER_SITE_OTHER): Payer: 59

## 2021-08-24 DIAGNOSIS — J309 Allergic rhinitis, unspecified: Secondary | ICD-10-CM

## 2021-08-31 MED ORDER — EPIPEN 2-PAK 0.3 MG/0.3ML IJ SOAJ: 0.3000 mg | INTRAMUSCULAR | 1 refills | Status: DC | PRN

## 2021-08-31 NOTE — Progress Notes (Signed)
Immunotherapy   Patient Details  Name: BRIGITT MCCLISH MRN: [id number] Date of Birth: 1960-12-08  08/31/2021  Myra Rude Ohman started injections for  Ragweed, and Mold.  Following schedule: A  Frequency:1 time per week Epi-Pen:Prescription for Epi-Pen given Consent signed and patient instructions given. Patient waited in her car for thirty minutes without an issue.    Julius Bowels 08/31/2021, 4:30 PM

## 2021-09-01 ENCOUNTER — Telehealth: Payer: Self-pay

## 2021-09-01 MED ORDER — EPIPEN 2-PAK 0.3 MG/0.3ML IJ SOAJ: 0.3000 mg | INTRAMUSCULAR | 1 refills | Status: DC | PRN

## 2021-09-01 NOTE — Telephone Encounter (Signed)
Patient called stating her insurance will not cover name brand epi pen. Please send in as generic.  [facility]

## 2021-09-06 ENCOUNTER — Telehealth: Payer: Self-pay | Admitting: "Endocrinology

## 2021-09-06 ENCOUNTER — Other Ambulatory Visit (HOSPITAL_COMMUNITY): Payer: Self-pay | Admitting: Nurse Practitioner

## 2021-09-06 DIAGNOSIS — Z1231 Encounter for screening mammogram for malignant neoplasm of breast: Secondary | ICD-10-CM

## 2021-09-09 ENCOUNTER — Ambulatory Visit (INDEPENDENT_AMBULATORY_CARE_PROVIDER_SITE_OTHER): Payer: 59

## 2021-09-09 DIAGNOSIS — J309 Allergic rhinitis, unspecified: Secondary | ICD-10-CM | POA: Diagnosis not present

## 2021-09-14 ENCOUNTER — Ambulatory Visit (INDEPENDENT_AMBULATORY_CARE_PROVIDER_SITE_OTHER): Payer: 59

## 2021-09-14 DIAGNOSIS — J309 Allergic rhinitis, unspecified: Secondary | ICD-10-CM | POA: Diagnosis not present

## 2021-09-23 ENCOUNTER — Ambulatory Visit (INDEPENDENT_AMBULATORY_CARE_PROVIDER_SITE_OTHER): Payer: 59

## 2021-09-23 DIAGNOSIS — J309 Allergic rhinitis, unspecified: Secondary | ICD-10-CM | POA: Diagnosis not present

## 2021-09-30 ENCOUNTER — Ambulatory Visit (INDEPENDENT_AMBULATORY_CARE_PROVIDER_SITE_OTHER): Payer: 59

## 2021-09-30 DIAGNOSIS — J309 Allergic rhinitis, unspecified: Secondary | ICD-10-CM

## 2021-10-05 ENCOUNTER — Ambulatory Visit (INDEPENDENT_AMBULATORY_CARE_PROVIDER_SITE_OTHER): Payer: 59

## 2021-10-05 DIAGNOSIS — J309 Allergic rhinitis, unspecified: Secondary | ICD-10-CM

## 2021-10-06 ENCOUNTER — Ambulatory Visit (INDEPENDENT_AMBULATORY_CARE_PROVIDER_SITE_OTHER): Payer: 59 | Admitting: Adult Health

## 2021-10-06 ENCOUNTER — Encounter: Payer: Self-pay | Admitting: Adult Health

## 2021-10-06 VITALS — BP 140/90 | HR 107 | Ht 69.0 in | Wt 254.0 lb

## 2021-10-06 DIAGNOSIS — R454 Irritability and anger: Secondary | ICD-10-CM | POA: Insufficient documentation

## 2021-10-06 DIAGNOSIS — Z90711 Acquired absence of uterus with remaining cervical stump: Secondary | ICD-10-CM | POA: Diagnosis not present

## 2021-10-06 DIAGNOSIS — Z853 Personal history of malignant neoplasm of breast: Secondary | ICD-10-CM | POA: Diagnosis not present

## 2021-10-06 DIAGNOSIS — R232 Flushing: Secondary | ICD-10-CM

## 2021-10-06 MED ORDER — BUPROPION HCL ER (XL) 150 MG PO TB24: 150.0000 mg | ORAL_TABLET | Freq: Every day | ORAL | 3 refills | Status: DC

## 2021-10-06 NOTE — Progress Notes (Signed)
Subjective:     Patient ID: [name], female   DOB: Nov 02, 1960, 61 y.o.   MRN: [id number]  HPI [name] is a 61 year old black female, divorced, sp [facility], in complaining hot flashes, irritable  and stomach is bigger. Her mom died a year ago, and her dad is 77. She has history of breast cancer. Lab Results  Component Value Date   DIAGPAP  10/05/2020    - Negative for intraepithelial lesion or malignancy (NILM)   [place] Negative 10/05/2020   PCP is Dr Karie Kirks.   Review of Systems No problems with urination or BMs Wants to lose weight See HPI for positives  Reviewed past medical,surgical, social and family history. Reviewed medications and allergies.     Objective:   Physical Exam BP 140/90 (BP Location: Left Arm, Patient Position: Sitting, Cuff Size: Normal)   Pulse (!) 107   Ht '5\' 9"'$  (1.753 m)   Wt 254 lb (115.2 kg)   BMI 37.51 kg/m     Skin warm and dry. Lungs: clear to ausculation bilaterally. Cardiovascular: regular rate and rhythm.  Fall risk is low  Upstream - 10/06/21 0927       Pregnancy Intention Screening   Does the patient want to become pregnant in the next year? No    Does the patient's partner want to become pregnant in the next year? No    Would the patient like to discuss contraceptive options today? No      Contraception Wrap Up   Current Method Female Sterilization   hyst   End Method Female Sterilization   hyst               10/06/2021   10:02 AM 10/05/2020    2:26 PM  Depression screen PHQ 2/9  Decreased Interest 0 0  Down, Depressed, Hopeless 1 1  PHQ - 2 Score 1 1  Altered sleeping 2 1  Tired, decreased energy 2 1  Change in appetite 2 1  Feeling bad or failure about yourself  0 0  Trouble concentrating 0 0  Moving slowly or fidgety/restless 0 0  Suicidal thoughts 0 0  PHQ-9 Score 7 4   She is on cymbalta    10/06/2021   10:03 AM 10/05/2020    2:27 PM  GAD 7 : Generalized Anxiety Score  Nervous, Anxious, on Edge 1 1   Control/stop worrying 1 0  Worry too much - different things 0 1  Trouble relaxing 0 0  Restless 0 0  Easily annoyed or irritable 1 0  Afraid - awful might happen 0 0  Total GAD 7 Score 3 2     Assessment:      1. Hot flashes Has 1 really bad one and will sweat Discussed that since she had breast cancer can not take estrogen Will add  Wellbutrin to see if helps with hot flashes and being irritable and with losing weight  Meds ordered this encounter  Medications   buPROPion (WELLBUTRIN XL) 150 MG 24 hr tablet    Sig: Take 1 tablet (150 mg total) by mouth daily.    Dispense:  30 tablet    Refill:  3    Order Specific Question:   Supervising Provider    Answer:   Florian Buff [[id number]]   Can try veozah if persists, but will have to monitor LFTs She had labs 08/03/21 with PCP, ALT 13, AST 18.  2. Irritable Will add Wellbutrin  3. History of breast  cancer  4. S/P abdominal supracervical subtotal hysterectomy     Plan:     Follow up with me in 6 weeks for ROS

## 2021-10-10 ENCOUNTER — Ambulatory Visit (HOSPITAL_COMMUNITY)
Admission: RE | Admit: 2021-10-10 | Discharge: 2021-10-10 | Disposition: A | Discharge status: Home / Self Care | Payer: 59 | Source: Ambulatory Visit | Attending: Nurse Practitioner | Admitting: Nurse Practitioner

## 2021-10-10 DIAGNOSIS — Z1231 Encounter for screening mammogram for malignant neoplasm of breast: Secondary | ICD-10-CM | POA: Diagnosis not present

## 2021-10-14 ENCOUNTER — Ambulatory Visit (INDEPENDENT_AMBULATORY_CARE_PROVIDER_SITE_OTHER): Payer: 59

## 2021-10-14 DIAGNOSIS — J309 Allergic rhinitis, unspecified: Secondary | ICD-10-CM | POA: Diagnosis not present

## 2021-10-19 ENCOUNTER — Ambulatory Visit: Payer: Self-pay

## 2021-10-21 ENCOUNTER — Ambulatory Visit (INDEPENDENT_AMBULATORY_CARE_PROVIDER_SITE_OTHER): Payer: 59

## 2021-10-21 DIAGNOSIS — J309 Allergic rhinitis, unspecified: Secondary | ICD-10-CM | POA: Diagnosis not present

## 2021-10-28 ENCOUNTER — Ambulatory Visit (INDEPENDENT_AMBULATORY_CARE_PROVIDER_SITE_OTHER): Payer: 59 | Admitting: *Deleted

## 2021-10-28 DIAGNOSIS — J309 Allergic rhinitis, unspecified: Secondary | ICD-10-CM

## 2021-11-02 ENCOUNTER — Ambulatory Visit (INDEPENDENT_AMBULATORY_CARE_PROVIDER_SITE_OTHER): Payer: 59

## 2021-11-02 DIAGNOSIS — J309 Allergic rhinitis, unspecified: Secondary | ICD-10-CM

## 2021-11-09 ENCOUNTER — Ambulatory Visit (INDEPENDENT_AMBULATORY_CARE_PROVIDER_SITE_OTHER): Payer: 59

## 2021-11-09 DIAGNOSIS — J309 Allergic rhinitis, unspecified: Secondary | ICD-10-CM | POA: Diagnosis not present

## 2021-11-16 ENCOUNTER — Ambulatory Visit (INDEPENDENT_AMBULATORY_CARE_PROVIDER_SITE_OTHER): Payer: 59

## 2021-11-16 DIAGNOSIS — J309 Allergic rhinitis, unspecified: Secondary | ICD-10-CM | POA: Diagnosis not present

## 2021-11-17 ENCOUNTER — Ambulatory Visit: Payer: Self-pay | Admitting: Adult Health

## 2021-11-23 ENCOUNTER — Ambulatory Visit (INDEPENDENT_AMBULATORY_CARE_PROVIDER_SITE_OTHER): Payer: 59

## 2021-11-23 DIAGNOSIS — J309 Allergic rhinitis, unspecified: Secondary | ICD-10-CM | POA: Diagnosis not present

## 2021-11-30 ENCOUNTER — Ambulatory Visit (INDEPENDENT_AMBULATORY_CARE_PROVIDER_SITE_OTHER): Payer: 59

## 2021-11-30 DIAGNOSIS — J309 Allergic rhinitis, unspecified: Secondary | ICD-10-CM

## 2021-12-07 ENCOUNTER — Ambulatory Visit (INDEPENDENT_AMBULATORY_CARE_PROVIDER_SITE_OTHER): Payer: 59

## 2021-12-07 DIAGNOSIS — J309 Allergic rhinitis, unspecified: Secondary | ICD-10-CM | POA: Diagnosis not present

## 2021-12-10 DIAGNOSIS — Z853 Personal history of malignant neoplasm of breast: Secondary | ICD-10-CM | POA: Diagnosis not present

## 2021-12-10 DIAGNOSIS — I10 Essential (primary) hypertension: Secondary | ICD-10-CM | POA: Diagnosis not present

## 2021-12-10 DIAGNOSIS — Z6836 Body mass index (BMI) 36.0-36.9, adult: Secondary | ICD-10-CM | POA: Diagnosis not present

## 2021-12-14 ENCOUNTER — Ambulatory Visit (INDEPENDENT_AMBULATORY_CARE_PROVIDER_SITE_OTHER): Payer: 59

## 2021-12-14 DIAGNOSIS — J309 Allergic rhinitis, unspecified: Secondary | ICD-10-CM | POA: Diagnosis not present

## 2022-01-04 ENCOUNTER — Ambulatory Visit (INDEPENDENT_AMBULATORY_CARE_PROVIDER_SITE_OTHER): Payer: 59

## 2022-01-04 DIAGNOSIS — J309 Allergic rhinitis, unspecified: Secondary | ICD-10-CM | POA: Diagnosis not present

## 2022-01-13 ENCOUNTER — Ambulatory Visit (INDEPENDENT_AMBULATORY_CARE_PROVIDER_SITE_OTHER): Payer: 59

## 2022-01-13 DIAGNOSIS — J309 Allergic rhinitis, unspecified: Secondary | ICD-10-CM

## 2022-01-18 ENCOUNTER — Ambulatory Visit (INDEPENDENT_AMBULATORY_CARE_PROVIDER_SITE_OTHER): Payer: 59

## 2022-01-18 DIAGNOSIS — J309 Allergic rhinitis, unspecified: Secondary | ICD-10-CM | POA: Diagnosis not present

## 2022-01-25 ENCOUNTER — Ambulatory Visit (INDEPENDENT_AMBULATORY_CARE_PROVIDER_SITE_OTHER): Payer: 59

## 2022-01-25 DIAGNOSIS — J309 Allergic rhinitis, unspecified: Secondary | ICD-10-CM

## 2022-02-01 ENCOUNTER — Ambulatory Visit (INDEPENDENT_AMBULATORY_CARE_PROVIDER_SITE_OTHER): Payer: 59

## 2022-02-01 ENCOUNTER — Ambulatory Visit: Payer: 59 | Admitting: Allergy & Immunology

## 2022-02-01 DIAGNOSIS — J309 Allergic rhinitis, unspecified: Secondary | ICD-10-CM

## 2022-02-06 ENCOUNTER — Ambulatory Visit: Payer: 59 | Admitting: Allergy & Immunology

## 2022-02-06 ENCOUNTER — Other Ambulatory Visit: Payer: Self-pay

## 2022-02-06 ENCOUNTER — Encounter: Payer: Self-pay | Admitting: Allergy & Immunology

## 2022-02-06 VITALS — BP 122/84 | HR 107 | Temp 97.6°F | Resp 16 | Ht 68.5 in | Wt 264.4 lb

## 2022-02-06 DIAGNOSIS — L2089 Other atopic dermatitis: Secondary | ICD-10-CM | POA: Diagnosis not present

## 2022-02-06 DIAGNOSIS — J4530 Mild persistent asthma, uncomplicated: Secondary | ICD-10-CM | POA: Diagnosis not present

## 2022-02-06 DIAGNOSIS — J302 Other seasonal allergic rhinitis: Secondary | ICD-10-CM

## 2022-02-06 DIAGNOSIS — J309 Allergic rhinitis, unspecified: Secondary | ICD-10-CM | POA: Diagnosis not present

## 2022-02-06 MED ORDER — BUDESONIDE-FORMOTEROL FUMARATE 160-4.5 MCG/ACT IN AERO: 2.0000 | INHALATION_SPRAY | Freq: Two times a day (BID) | RESPIRATORY_TRACT | 5 refills | Status: DC

## 2022-02-06 MED ORDER — FLUTICASONE PROPIONATE 50 MCG/ACT NA SUSP: 1.0000 | Freq: Every day | NASAL | 1 refills | Status: DC

## 2022-02-06 MED ORDER — EPIPEN 2-PAK 0.3 MG/0.3ML IJ SOAJ: 0.3000 mg | INTRAMUSCULAR | 1 refills | Status: DC | PRN

## 2022-02-06 MED ORDER — ALBUTEROL SULFATE HFA 108 (90 BASE) MCG/ACT IN AERS: 2.0000 | INHALATION_SPRAY | Freq: Four times a day (QID) | RESPIRATORY_TRACT | 1 refills | Status: DC | PRN

## 2022-02-06 NOTE — Patient Instructions (Addendum)
1. Mild persistent asthma, uncomplicated - Lung testing not done since you are feeling so good.  - Daily controller medication(s): NOTHING - Prior to physical activity: ProAir Digihaler 2 puffs 10-15 minutes before physical activity. - Rescue medications: Symbicort OR albuterol two puffs every 4-6 hours as needed - Asthma control goals:  * Full participation in all desired activities (may need albuterol before activity) * Albuterol use two time or less a week on average (not counting use with activity) * Cough interfering with sleep two time or less a month * Oral steroids no more than once a year * No hospitalizations.  2. Chronic rhinitis (ragweed, indoor molds, and outdoor molds) - Continue with: over the counter antihistamines 1-2 times daily - Continue taking: Flonase (fluticasone) one spray per nostril daily - We are going to continue with the allergy shots at the same schedule.   3. GERD  - Continue omeprazole '40mg'$  daily to help with reflux.   4. Return in about 6 months (around 08/07/2022).    Please inform us of any Emergency Department visits, hospitalizations, or changes in symptoms. Call us before going to the ED for breathing or allergy symptoms since we might be able to fit you in for a sick visit. Feel free to contact us anytime with any questions, problems, or concerns.  It was a pleasure to see you again today!  Websites that have reliable patient information: 1. American Academy of Asthma, Allergy, and Immunology: www.aaaai.org 2. Food Allergy Research and Education (FARE): foodallergy.org 3. Mothers of Asthmatics: http://www.asthmacommunitynetwork.org 4. American College of Allergy, Asthma, and Immunology: www.acaai.org   COVID-19 Vaccine Information can be found at: ShippingScam.co.uk For questions related to vaccine distribution or appointments, please email [email or web address] or call [phone].    We realize that you might be concerned about having an allergic reaction to the COVID19 vaccines. To help with that concern, WE ARE OFFERING THE COVID19 VACCINES IN OUR OFFICE! Ask the front desk for dates!     "Like" [place] on Facebook and Instagram for our latest updates!      A healthy democracy works best when [employer] participate! Make sure you are registered to vote! If you have moved or changed any of your contact information, you will need to get this updated before voting!  In some cases, you MAY be able to register to vote online: [email or web address]

## 2022-02-06 NOTE — Progress Notes (Signed)
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   Subjective:   [name] is a 61 y.o. female presenting today for follow up of  Chief Complaint  Patient presents with  . Asthma    States that she is feeling better and has not had the need to use her rescue inhaler.   . Immunotherapy    States that she thinks it is working.   . Allergic Rhinitis     Says she is well. No issues since last visit.   . Allergic Reaction    Says she has a reaction to green tea. Consumption causes caused GI issues. States that when she uses the tea bags or products on her body she begins to swell.   Also had a reaction to fresh pineapple causing itching on the lips and inside her mouth.     [name] has a history of the following: Patient Active Problem List   Diagnosis Date Noted  . History of breast cancer 10/06/2021  . Irritable 10/06/2021  . Hot flashes 10/06/2021  . Abdominal bloating 10/05/2020  . Encounter for screening fecal occult blood testing 10/05/2020  . Encounter for gynecological examination with Papanicolaou smear of cervix 10/05/2020  . S/P abdominal supracervical subtotal hysterectomy 10/05/2020  . Encounter for screening colonoscopy 08/21/2014  . Diarrhea 08/21/2014  . 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 Breast cancer of lower-outer quadrant of left female breast ([place]) 12/13/2009  . ANXIETY 12/13/2009  . CHEST PAIN 12/13/2009    History obtained from: chart review and {Persons; PED relatives w/patient:19415::"patient"}.  [name] is a 61 y.o. female presenting for {Blank single:19197::"a food challenge","a drug challenge","skin testing","a sick visit","an evaluation of  ***","a follow up visit"}. She was last seen in May 2023. At that time, we did not do lung testing.  We continued with Breztri 2 puffs twice daily with spacer as well as albuterol as needed.  For her rhinitis, she decided to start allergen immunotherapy.  We continue with Flonase.  Her reflux was under good control with omeprazole 40 mg daily.  Since the last time we saw her,  Asthma/Respiratory Symptom History: She feels much better with her breathing. The allergy shots have helped her a lot. She has not been using her Symbicort at all since she has been on her allergy shots. She has not been using her emergency inhaler at all either which is rather impressive. She did have some problems during the summer, but with the cooler weather things are going well.   {Blank single:19197::"Allergic Rhinitis Symptom History: ***"," "}  [name] is on allergen immunotherapy. She receives one injection. Immunotherapy script #1 contains  ragweed and molds. She currently receives 0.31m of the GOLD vial (1/10,000). She started shots June of 2023 and not yet reached maintenance.    Food Allergy Symptom History: She had a problem on Thanksgiving Day. She had some cake as a thank you gift. She ate some cake which turned out to be pineapple. She put it in her mouth and her tongue starte d itching and swelling right away. She was spitting out her mouth and she eventually did fine. She took some Benadryl.   {Blank single:19197::"Skin Symptom History: ***"," "}  {  Blank single:19197::"GERD Symptom History: ***"," "}  She sees Dr. Karie Kirks and she loves him.   She lives with her brother and her father.   Otherwise, there have been no changes to her past medical history, surgical history, family history, or social history.    ROS     Objective:   Blood pressure 122/84, pulse (!) 107, temperature 97.6 F (36.4 C), temperature source Temporal, resp. rate 16, height 5' 8.5" (1.74 m), weight 264 lb 6.4 oz (119.9  kg), SpO2 97 %. Body mass index is 39.61 kg/m.    Physical Exam   Diagnostic studies: {Blank single:19197::"none","deferred due to recent antihistamine use","labs sent instead"," "}  Spirometry: {Blank single:19197::"results normal (FEV1: ***%, FVC: ***%, FEV1/FVC: ***%)","results abnormal (FEV1: ***%, FVC: ***%, FEV1/FVC: ***%)"}.    {Blank single:19197::"Spirometry consistent with mild obstructive disease","Spirometry consistent with moderate obstructive disease","Spirometry consistent with severe obstructive disease","Spirometry consistent with possible restrictive disease","Spirometry consistent with mixed obstructive and restrictive disease","Spirometry uninterpretable due to technique","Spirometry consistent with normal pattern"}. {Blank single:19197::"Albuterol/Atrovent nebulizer","Xopenex/Atrovent nebulizer","Albuterol nebulizer","Albuterol four puffs via MDI","Xopenex four puffs via MDI"} treatment given in clinic with {Blank single:19197::"significant improvement in FEV1 per ATS criteria","significant improvement in FVC per ATS criteria","significant improvement in FEV1 and FVC per ATS criteria","improvement in FEV1, but not significant per ATS criteria","improvement in FVC, but not significant per ATS criteria","improvement in FEV1 and FVC, but not significant per ATS criteria","no improvement"}.  Allergy Studies: {Blank single:19197::"none","labs sent instead"," "}    {Blank single:19197::"Allergy testing results were read and interpreted by myself, documented by clinical staff."," "}      Salvatore Marvel, MD  [facility]

## 2022-02-07 ENCOUNTER — Encounter: Payer: Self-pay | Admitting: Allergy & Immunology

## 2022-02-15 ENCOUNTER — Ambulatory Visit (INDEPENDENT_AMBULATORY_CARE_PROVIDER_SITE_OTHER): Payer: 59

## 2022-02-15 DIAGNOSIS — J309 Allergic rhinitis, unspecified: Secondary | ICD-10-CM

## 2022-02-22 ENCOUNTER — Ambulatory Visit (INDEPENDENT_AMBULATORY_CARE_PROVIDER_SITE_OTHER): Payer: 59

## 2022-02-22 DIAGNOSIS — J309 Allergic rhinitis, unspecified: Secondary | ICD-10-CM | POA: Diagnosis not present

## 2022-03-01 ENCOUNTER — Ambulatory Visit (INDEPENDENT_AMBULATORY_CARE_PROVIDER_SITE_OTHER): Payer: 59

## 2022-03-01 DIAGNOSIS — J309 Allergic rhinitis, unspecified: Secondary | ICD-10-CM | POA: Diagnosis not present

## 2022-03-08 ENCOUNTER — Ambulatory Visit (INDEPENDENT_AMBULATORY_CARE_PROVIDER_SITE_OTHER): Payer: 59

## 2022-03-08 DIAGNOSIS — J309 Allergic rhinitis, unspecified: Secondary | ICD-10-CM

## 2022-03-15 ENCOUNTER — Ambulatory Visit (INDEPENDENT_AMBULATORY_CARE_PROVIDER_SITE_OTHER): Payer: 59

## 2022-03-15 DIAGNOSIS — J309 Allergic rhinitis, unspecified: Secondary | ICD-10-CM | POA: Diagnosis not present

## 2022-03-22 ENCOUNTER — Ambulatory Visit (INDEPENDENT_AMBULATORY_CARE_PROVIDER_SITE_OTHER): Payer: 59

## 2022-03-22 DIAGNOSIS — J309 Allergic rhinitis, unspecified: Secondary | ICD-10-CM | POA: Diagnosis not present

## 2022-04-03 ENCOUNTER — Telehealth: Payer: Self-pay

## 2022-04-03 ENCOUNTER — Telehealth: Payer: Self-pay | Admitting: Internal Medicine

## 2022-04-03 MED ORDER — NIRMATRELVIR/RITONAVIR (PAXLOVID)TABLET: 3.0000 | ORAL_TABLET | Freq: Two times a day (BID) | ORAL | 0 refills | Status: AC

## 2022-04-03 NOTE — Telephone Encounter (Signed)
Received message noting patient is positive for COVID.  She does have asthma. Will send in Paxlovid.

## 2022-04-03 NOTE — Telephone Encounter (Signed)
Called and left a message for patient as she called and expressed that her and her elderly father were both positive for Covid. Please advise on what strength of Paxlovid to send in.

## 2022-04-04 NOTE — Telephone Encounter (Signed)
Thanks, Dr. Patel!

## 2022-04-05 ENCOUNTER — Ambulatory Visit (INDEPENDENT_AMBULATORY_CARE_PROVIDER_SITE_OTHER): Payer: 59

## 2022-04-05 DIAGNOSIS — J309 Allergic rhinitis, unspecified: Secondary | ICD-10-CM

## 2022-04-12 ENCOUNTER — Ambulatory Visit (INDEPENDENT_AMBULATORY_CARE_PROVIDER_SITE_OTHER): Payer: 59

## 2022-04-12 DIAGNOSIS — J309 Allergic rhinitis, unspecified: Secondary | ICD-10-CM | POA: Diagnosis not present

## 2022-04-19 ENCOUNTER — Ambulatory Visit (INDEPENDENT_AMBULATORY_CARE_PROVIDER_SITE_OTHER): Payer: 59

## 2022-04-19 DIAGNOSIS — J309 Allergic rhinitis, unspecified: Secondary | ICD-10-CM | POA: Diagnosis not present

## 2022-04-26 ENCOUNTER — Ambulatory Visit (INDEPENDENT_AMBULATORY_CARE_PROVIDER_SITE_OTHER): Payer: 59

## 2022-04-26 DIAGNOSIS — J309 Allergic rhinitis, unspecified: Secondary | ICD-10-CM

## 2022-05-03 ENCOUNTER — Ambulatory Visit (INDEPENDENT_AMBULATORY_CARE_PROVIDER_SITE_OTHER): Payer: 59

## 2022-05-03 DIAGNOSIS — J309 Allergic rhinitis, unspecified: Secondary | ICD-10-CM

## 2022-05-10 ENCOUNTER — Ambulatory Visit (INDEPENDENT_AMBULATORY_CARE_PROVIDER_SITE_OTHER): Payer: 59

## 2022-05-10 DIAGNOSIS — J309 Allergic rhinitis, unspecified: Secondary | ICD-10-CM | POA: Diagnosis not present

## 2022-05-17 ENCOUNTER — Ambulatory Visit (INDEPENDENT_AMBULATORY_CARE_PROVIDER_SITE_OTHER): Payer: Self-pay

## 2022-05-17 DIAGNOSIS — J309 Allergic rhinitis, unspecified: Secondary | ICD-10-CM

## 2022-05-17 MED ORDER — OLOPATADINE HCL 0.2 % OP SOLN: 1.0000 [drp] | OPHTHALMIC | 5 refills | Status: DC

## 2022-05-17 NOTE — Progress Notes (Signed)
Patient came I for her injection today and asked for a prescription of pataday as she uses this OTC and wants to see if her insurance will cover it. I verbally expressed this with Dr. Ernst Bowler and he gave a verbal to send it in to her pharmacy on file. Pataday has been sent in and patient has been notified.

## 2022-05-24 ENCOUNTER — Ambulatory Visit (INDEPENDENT_AMBULATORY_CARE_PROVIDER_SITE_OTHER): Payer: Self-pay

## 2022-05-24 DIAGNOSIS — J309 Allergic rhinitis, unspecified: Secondary | ICD-10-CM

## 2022-05-31 ENCOUNTER — Ambulatory Visit (INDEPENDENT_AMBULATORY_CARE_PROVIDER_SITE_OTHER): Payer: Self-pay

## 2022-05-31 DIAGNOSIS — J309 Allergic rhinitis, unspecified: Secondary | ICD-10-CM

## 2022-06-07 ENCOUNTER — Ambulatory Visit (INDEPENDENT_AMBULATORY_CARE_PROVIDER_SITE_OTHER): Payer: Self-pay

## 2022-06-07 DIAGNOSIS — J309 Allergic rhinitis, unspecified: Secondary | ICD-10-CM

## 2022-06-14 ENCOUNTER — Ambulatory Visit (INDEPENDENT_AMBULATORY_CARE_PROVIDER_SITE_OTHER): Payer: Self-pay

## 2022-06-14 DIAGNOSIS — J309 Allergic rhinitis, unspecified: Secondary | ICD-10-CM

## 2022-06-21 ENCOUNTER — Ambulatory Visit (INDEPENDENT_AMBULATORY_CARE_PROVIDER_SITE_OTHER): Payer: Self-pay

## 2022-06-21 DIAGNOSIS — J309 Allergic rhinitis, unspecified: Secondary | ICD-10-CM

## 2022-06-21 NOTE — Progress Notes (Signed)
VIAL EXP 06-21-23 

## 2022-06-22 DIAGNOSIS — J301 Allergic rhinitis due to pollen: Secondary | ICD-10-CM

## 2022-06-28 ENCOUNTER — Ambulatory Visit (INDEPENDENT_AMBULATORY_CARE_PROVIDER_SITE_OTHER): Payer: 59

## 2022-06-28 DIAGNOSIS — J309 Allergic rhinitis, unspecified: Secondary | ICD-10-CM

## 2022-07-05 ENCOUNTER — Ambulatory Visit (INDEPENDENT_AMBULATORY_CARE_PROVIDER_SITE_OTHER): Payer: 59

## 2022-07-05 DIAGNOSIS — J309 Allergic rhinitis, unspecified: Secondary | ICD-10-CM | POA: Diagnosis not present

## 2022-07-12 ENCOUNTER — Ambulatory Visit (INDEPENDENT_AMBULATORY_CARE_PROVIDER_SITE_OTHER): Payer: 59

## 2022-07-12 DIAGNOSIS — J309 Allergic rhinitis, unspecified: Secondary | ICD-10-CM | POA: Diagnosis not present

## 2022-07-19 ENCOUNTER — Ambulatory Visit (INDEPENDENT_AMBULATORY_CARE_PROVIDER_SITE_OTHER): Payer: 59

## 2022-07-19 DIAGNOSIS — J309 Allergic rhinitis, unspecified: Secondary | ICD-10-CM

## 2022-08-02 ENCOUNTER — Ambulatory Visit (INDEPENDENT_AMBULATORY_CARE_PROVIDER_SITE_OTHER): Payer: 59

## 2022-08-02 DIAGNOSIS — J309 Allergic rhinitis, unspecified: Secondary | ICD-10-CM | POA: Diagnosis not present

## 2022-08-03 ENCOUNTER — Telehealth: Payer: Self-pay | Admitting: Allergy & Immunology

## 2022-08-03 NOTE — Telephone Encounter (Signed)
Forwarding message to provider a update. 

## 2022-08-03 NOTE — Telephone Encounter (Signed)
Patient wanted Dr. Dellis Anes to know that the three Dr.s he referred her to were not taking new patients, she is using Dr. DeBary Blas and her appt is June 17.

## 2022-08-04 NOTE — Telephone Encounter (Signed)
Thanks for the update. [name] - can you update that in our records? I could not find a Dr. Sandrea Hammond... But maybe you have some insider info.  Malachi Bonds, MD [facility]

## 2022-08-08 NOTE — Telephone Encounter (Signed)
Can be updated once patient is established with their office.  Thanks

## 2022-08-09 ENCOUNTER — Encounter: Payer: Self-pay | Admitting: Allergy & Immunology

## 2022-08-09 ENCOUNTER — Ambulatory Visit: Payer: 59 | Admitting: Allergy & Immunology

## 2022-08-09 ENCOUNTER — Other Ambulatory Visit: Payer: Self-pay

## 2022-08-09 VITALS — BP 132/92 | HR 118 | Temp 97.9°F | Resp 16 | Ht 68.75 in | Wt 261.4 lb

## 2022-08-09 DIAGNOSIS — J309 Allergic rhinitis, unspecified: Secondary | ICD-10-CM | POA: Diagnosis not present

## 2022-08-09 DIAGNOSIS — J4530 Mild persistent asthma, uncomplicated: Secondary | ICD-10-CM | POA: Diagnosis not present

## 2022-08-09 DIAGNOSIS — G8929 Other chronic pain: Secondary | ICD-10-CM | POA: Diagnosis not present

## 2022-08-09 DIAGNOSIS — R519 Headache, unspecified: Secondary | ICD-10-CM

## 2022-08-09 DIAGNOSIS — J302 Other seasonal allergic rhinitis: Secondary | ICD-10-CM

## 2022-08-09 MED ORDER — CLOBETASOL PROPIONATE 0.05 % EX OINT: 1.0000 | TOPICAL_OINTMENT | Freq: Two times a day (BID) | CUTANEOUS | 1 refills | Status: DC

## 2022-08-09 NOTE — Progress Notes (Signed)
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 Mild persistent asthma, uncomplicated - Lung testing looks amazing today!  - Daily controller medication(s): NOTHING - Prior to physical activity: albuterol 2 puffs 10-15 minutes before physical activity. - Rescue medications: Symbicort OR albuterol two puffs every 4-6 hours as needed - Asthma control goals:  * Full participation in all desired activities (may need albuterol before activity) * Albuterol use two time or less a week on average (not counting use with activity) * Cough interfering with sleep two time or less a month * Oral steroids no more than once a year * No hospitalizations.  2. Chronic rhinitis (ragweed, indoor molds, and outdoor molds) - with maintenance reached March 2024 - Continue with: over the counter antihistamines 1-2 times daily - Continue taking: Flonase (fluticasone) one spray per nostril daily - We are going to continue with the allergy shots at the same schedule.   3. GERD  - Continue omeprazole 40mg  daily to help with reflux.   4. Poison ivy - Start clobetasol twice daily for 2-3 weeks.   5. Headaches - We we will send you to neurology for an evaluation of headaches. - Keep a headache diary which might be helpful for them.  6. Return in about 6 months (around 02/09/2023).    Subjective:   [name] is a 62 y.o. female presenting today for follow up of  Chief Complaint  Patient presents with   Follow-up    Passion Fruit-roof of mouth/tongue started tingling    Headache    [name] has a history of the following: Patient Active Problem List   Diagnosis Date Noted   History  of breast cancer 10/06/2021   Irritable 10/06/2021   Hot flashes 10/06/2021   Abdominal bloating 10/05/2020   Encounter for screening fecal occult blood testing 10/05/2020   Encounter for gynecological examination with Papanicolaou smear of cervix 10/05/2020   S/P abdominal supracervical subtotal hysterectomy 10/05/2020   Encounter for screening colonoscopy 08/21/2014   Diarrhea 08/21/2014   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  Delmar Landau is the new PCP.   [name] is a 62 y.o. female presenting for a follow up visit.  She was last seen in November 2023.  At that time, we did not do lung testing.  We continue with albuterol as needed and Symbicort added during flares.  For her rhinitis, we continue with an over-the-counter antihistamine as well as Flonase.  She remains on her allergy shots.  She will also remain on omeprazole 40 mg daily.  Since last visit, she has done well.  She did establish care with Dr. Delmar Landau.  He is at [facility].  Asthma/Respiratory Symptom History: Asthma has been well controlled.  She has not needed her Symbicort. The shots have helped a lot with that.  Her breathing has been very well-controlled on the current regimen.  She has not been on prednisone and has not been to the emergency room for breathing.  ACT score is  25, indicating excellent asthma control.  Allergic Rhinitis Symptom History: Allergic rhinitis symptoms are under good control with allergy shots.  She feels amazing but realizes that the big challenge is going to be in the fall when the ragweed season starts.  She remains on the over-the-counter antihistamine as well as Flonase.  She does report that she is getting some headaches.  These are associated with her cycles, which have unexpectedly  started again.  She has never seen neurology for this.  She has not talked to her PCP about this.  She has not taken a headache diary.  Brady is on allergen immunotherapy. She receives one injection. Immunotherapy script #1 contains  ragweed and molds. She currently receives 0.48mL of the RED vial (1/100). She started shots June of 2023 and reached maintenance in March 2024.   Food Allergy Symptom History: She tried passion fruit and had a reaction to it. She is still avoiding pineapple like the plague. EPiPen is up to date.   Skin Symptom History: She has a poison ivy breakout on her left lower extremity.  It is a fairly small area.  She was helping her brother clear out some weeds and brush.  She has been using some over-the-counter hydrocortisone without much success.  GERD Symptom History: She remains on omeprazole 40 mg daily.  This is controlling her symptoms very effectively.  Otherwise, there have been no changes to her past medical history, surgical history, family history, or social history.    Review of Systems  Constitutional: Negative.  Negative for chills, fever, malaise/fatigue and weight loss.  HENT:  Positive for congestion. Negative for ear discharge, ear pain and sinus pain.   Eyes:  Negative for pain, discharge and redness.  Respiratory:  Negative for cough, sputum production, shortness of breath and wheezing.   Cardiovascular: Negative.  Negative for chest pain and palpitations.  Gastrointestinal:  Negative for abdominal pain, constipation, diarrhea, heartburn, nausea and vomiting.  Skin: Negative.  Negative for itching and rash.  Neurological:  Positive for headaches. Negative for dizziness.  Endo/Heme/Allergies:  Positive for environmental allergies. Does not bruise/bleed easily.       Objective:   Blood pressure (!) 132/92, pulse (!) 118, temperature 97.9 F (36.6 C), resp. rate 16, height 5' 8.75" (1.746 m), weight 261 lb 6 oz (118.6 kg), SpO2 97 %. Body  mass index is 38.88 kg/m.    Physical Exam Vitals reviewed.  Constitutional:      Appearance: She is well-developed.     Comments: Very talkative.  HENT:     Head: Normocephalic and atraumatic.     Right Ear: Tympanic membrane, ear canal and external ear normal.     Left Ear: Tympanic membrane, ear canal and external ear normal.     Nose: Mucosal edema and rhinorrhea present. No nasal deformity or septal deviation.     Right Turbinates: Enlarged, swollen and pale.     Left Turbinates: Enlarged, swollen and pale.     Right Sinus: No maxillary sinus tenderness or frontal sinus tenderness.     Left Sinus: No maxillary sinus tenderness or frontal sinus tenderness.     Comments: No nasal polyps.     Mouth/Throat:     Mouth: Mucous membranes are not pale and not dry.     Pharynx: Uvula midline.     Tonsils: Tonsillar exudate present. 1+ on the right. 1+ on the left.  Eyes:     General: Lids are normal. Allergic shiner present.  Right eye: No discharge.        Left eye: No discharge.     Conjunctiva/sclera: Conjunctivae normal.     Right eye: Right conjunctiva is not injected. No chemosis.    Left eye: Left conjunctiva is not injected. No chemosis.    Pupils: Pupils are equal, round, and reactive to light.  Cardiovascular:     Rate and Rhythm: Normal rate and regular rhythm.     Heart sounds: Normal heart sounds.  Pulmonary:     Effort: Pulmonary effort is normal. No tachypnea, accessory muscle usage or respiratory distress.     Breath sounds: Normal breath sounds. No wheezing, rhonchi or rales.     Comments: Moving air well in all lung fields. Chest:     Chest wall: No tenderness.  Lymphadenopathy:     Cervical: No cervical adenopathy.  Skin:    Coloration: Skin is not pale.     Findings: No abrasion, erythema, petechiae or rash. Rash is not papular, urticarial or vesicular.  Neurological:     Mental Status: She is alert.  Psychiatric:        Behavior: Behavior is  cooperative.      Diagnostic studies:    Spirometry: results normal (FEV1: 1.91/78%, FVC: 2.82/90%, FEV1/FVC: 68%).    Spirometry consistent with normal pattern.   Allergy Studies: none        Malachi Bonds, MD  [facility]

## 2022-08-09 NOTE — Patient Instructions (Addendum)
1. Mild persistent asthma, uncomplicated - Lung testing looks amazing today!  - Daily controller medication(s): NOTHING - Prior to physical activity: albuterol 2 puffs 10-15 minutes before physical activity. - Rescue medications: Symbicort OR albuterol two puffs every 4-6 hours as needed - Asthma control goals:  * Full participation in all desired activities (may need albuterol before activity) * Albuterol use two time or less a week on average (not counting use with activity) * Cough interfering with sleep two time or less a month * Oral steroids no more than once a year * No hospitalizations.  2. Chronic rhinitis (ragweed, indoor molds, and outdoor molds) - with maintenance reached March 2024 - Continue with: over the counter antihistamines 1-2 times daily - Continue taking: Flonase (fluticasone) one spray per nostril daily - We are going to continue with the allergy shots at the same schedule.   3. GERD  - Continue omeprazole 40mg  daily to help with reflux.   4. Poison ivy - Start clobetasol twice daily for 2-3 weeks.   5. Return in about 6 months (around 02/09/2023).    Please inform us of any Emergency Department visits, hospitalizations, or changes in symptoms. Call us before going to the ED for breathing or allergy symptoms since we might be able to fit you in for a sick visit. Feel free to contact us anytime with any questions, problems, or concerns.  It was a pleasure to see you again today!  Websites that have reliable patient information: 1. American Academy of Asthma, Allergy, and Immunology: www.aaaai.org 2. Food Allergy Research and Education (FARE): foodallergy.org 3. Mothers of Asthmatics: http://www.asthmacommunitynetwork.org 4. American College of Allergy, Asthma, and Immunology: www.acaai.org   COVID-19 Vaccine Information can be found at: [email or web address] For questions related to vaccine distribution  or appointments, please email [email or web address] or call [phone].   We realize that you might be concerned about having an allergic reaction to the COVID19 vaccines. To help with that concern, WE ARE OFFERING THE COVID19 VACCINES IN OUR OFFICE! Ask the front desk for dates!     "Like" [place] on Facebook and Instagram for our latest updates!      A healthy democracy works best when [employer] participate! Make sure you are registered to vote! If you have moved or changed any of your contact information, you will need to get this updated before voting!  In some cases, you MAY be able to register to vote online: [email or web address]

## 2022-08-16 ENCOUNTER — Ambulatory Visit (INDEPENDENT_AMBULATORY_CARE_PROVIDER_SITE_OTHER): Payer: 59

## 2022-08-16 DIAGNOSIS — J309 Allergic rhinitis, unspecified: Secondary | ICD-10-CM | POA: Diagnosis not present

## 2022-08-22 ENCOUNTER — Telehealth: Payer: Self-pay | Admitting: Allergy & Immunology

## 2022-08-22 NOTE — Telephone Encounter (Signed)
-----   Message from Alfonse Spruce, MD sent at 08/09/2022 12:19 PM EDT ----- Neuro referral placed.

## 2022-08-22 NOTE — Telephone Encounter (Signed)
Referral placed internally by Dr. Dellis Anes.   Patient is scheduled to see Dr. Ocie Doyne on 11-10-2022.  [facility] [phone]  [address]  [address] [place],  [place]  [address] Main: [phone] Fax: [phone]

## 2022-08-23 NOTE — Telephone Encounter (Signed)
Great - thanks

## 2022-08-28 ENCOUNTER — Encounter: Payer: Self-pay | Admitting: Internal Medicine

## 2022-08-28 ENCOUNTER — Ambulatory Visit (INDEPENDENT_AMBULATORY_CARE_PROVIDER_SITE_OTHER): Payer: Self-pay | Admitting: Internal Medicine

## 2022-08-28 VITALS — BP 139/85 | HR 91 | Ht 69.0 in | Wt 263.0 lb

## 2022-08-28 DIAGNOSIS — E669 Obesity, unspecified: Secondary | ICD-10-CM

## 2022-08-28 DIAGNOSIS — F32A Depression, unspecified: Secondary | ICD-10-CM

## 2022-08-28 DIAGNOSIS — R109 Unspecified abdominal pain: Secondary | ICD-10-CM

## 2022-08-28 DIAGNOSIS — R1032 Left lower quadrant pain: Secondary | ICD-10-CM

## 2022-08-28 DIAGNOSIS — R1031 Right lower quadrant pain: Secondary | ICD-10-CM

## 2022-08-28 DIAGNOSIS — J4520 Mild intermittent asthma, uncomplicated: Secondary | ICD-10-CM

## 2022-08-28 DIAGNOSIS — Z114 Encounter for screening for human immunodeficiency virus [HIV]: Secondary | ICD-10-CM

## 2022-08-28 DIAGNOSIS — Z8669 Personal history of other diseases of the nervous system and sense organs: Secondary | ICD-10-CM

## 2022-08-28 DIAGNOSIS — N3000 Acute cystitis without hematuria: Secondary | ICD-10-CM

## 2022-08-28 DIAGNOSIS — E782 Mixed hyperlipidemia: Secondary | ICD-10-CM

## 2022-08-28 DIAGNOSIS — I10 Essential (primary) hypertension: Secondary | ICD-10-CM

## 2022-08-28 DIAGNOSIS — Z853 Personal history of malignant neoplasm of breast: Secondary | ICD-10-CM

## 2022-08-28 DIAGNOSIS — Z1159 Encounter for screening for other viral diseases: Secondary | ICD-10-CM

## 2022-08-28 DIAGNOSIS — F419 Anxiety disorder, unspecified: Secondary | ICD-10-CM

## 2022-08-28 LAB — POCT URINALYSIS DIP (CLINITEK)
Bilirubin, UA: NEGATIVE
Blood, UA: NEGATIVE
Glucose, UA: NEGATIVE mg/dL
Ketones, POC UA: NEGATIVE mg/dL
Leukocytes, UA: NEGATIVE
Nitrite, UA: NEGATIVE
POC PROTEIN,UA: NEGATIVE
Spec Grav, UA: 1.025 (ref 1.010–1.025)
Urobilinogen, UA: 0.2 E.U./dL
pH, UA: 6.5 (ref 5.0–8.0)

## 2022-08-28 MED ORDER — NITROFURANTOIN MONOHYD MACRO 100 MG PO CAPS
100.0000 mg | ORAL_CAPSULE | Freq: Two times a day (BID) | ORAL | 0 refills | Status: AC
Start: 2022-08-28 — End: 2022-09-02

## 2022-08-28 MED ORDER — DICYCLOMINE HCL 10 MG PO CAPS
10.0000 mg | ORAL_CAPSULE | Freq: Three times a day (TID) | ORAL | 0 refills | Status: DC | PRN
Start: 2022-08-28 — End: 2022-12-15
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
## 2022-08-28 NOTE — Patient Instructions (Signed)
It was a pleasure to see you today.  Thank you for giving [place] the opportunity to be involved in your care.  Below is a brief recap of your visit and next steps.  We will plan to see you again in 4 weeks.  Summary You have established care today. We will check basic labs Macrobid prescribed for treatment of uti Try bentyl for relief of abdominal cramping Please keep a headache log over the next month Follow up in 1 month for lab review and reassessment

## 2022-08-29 LAB — CBC WITH DIFFERENTIAL/PLATELET
Basophils Absolute: 0 10*3/uL (ref 0.0–0.2)
Basos: 1 %
EOS (ABSOLUTE): 0.2 10*3/uL (ref 0.0–0.4)
Eos: 5 %
Hematocrit: 40.2 % (ref 34.0–46.6)
Hemoglobin: 13.5 g/dL (ref 11.1–15.9)
Immature Grans (Abs): 0 10*3/uL (ref 0.0–0.1)
Immature Granulocytes: 0 %
Lymphocytes Absolute: 1.8 10*3/uL (ref 0.7–3.1)
Lymphs: 44 %
MCH: 28.8 pg (ref 26.6–33.0)
MCHC: 33.6 g/dL (ref 31.5–35.7)
MCV: 86 fL (ref 79–97)
Monocytes Absolute: 0.4 10*3/uL (ref 0.1–0.9)
Monocytes: 8 %
Neutrophils Absolute: 1.8 10*3/uL (ref 1.4–7.0)
Neutrophils: 42 %
Platelets: 223 10*3/uL (ref 150–450)
RBC: 4.68 x10E6/uL (ref 3.77–5.28)
RDW: 12.7 % (ref 11.7–15.4)
WBC: 4.2 10*3/uL (ref 3.4–10.8)

## 2022-08-29 LAB — CMP14+EGFR
ALT: 17 IU/L (ref 0–32)
AST: 23 IU/L (ref 0–40)
Albumin: 4.1 g/dL (ref 3.8–4.9)
Alkaline Phosphatase: 100 IU/L (ref 44–121)
BUN/Creatinine Ratio: 15 (ref 9–23)
BUN: 11 mg/dL (ref 6–24)
Bilirubin Total: 0.3 mg/dL (ref 0.0–1.2)
CO2: 28 mmol/L (ref 20–29)
Calcium: 9.4 mg/dL (ref 8.7–10.2)
Chloride: 99 mmol/L (ref 96–106)
Creatinine, Ser: 0.73 mg/dL (ref 0.57–1.00)
Globulin, Total: 2.8 g/dL (ref 1.5–4.5)
Glucose: 94 mg/dL (ref 70–99)
Potassium: 3.7 mmol/L (ref 3.5–5.2)
Sodium: 142 mmol/L (ref 134–144)
Total Protein: 6.9 g/dL (ref 6.0–8.5)
eGFR: 95 mL/min/{1.73_m2} (ref >59)

## 2022-08-29 LAB — MICROSCOPIC EXAMINATION
Bacteria, UA: NONE SEEN
Casts: NONE SEEN /lpf
Epithelial Cells (non renal): NONE SEEN /hpf (ref 0–10)
RBC, Urine: NONE SEEN /hpf (ref 0–2)
WBC, UA: NONE SEEN /hpf (ref 0–5)

## 2022-08-29 LAB — UA/M W/RFLX CULTURE, ROUTINE
Bilirubin, UA: NEGATIVE
Glucose, UA: NEGATIVE
Ketones, UA: NEGATIVE
Leukocytes,UA: NEGATIVE
Nitrite, UA: NEGATIVE
RBC, UA: NEGATIVE
Specific Gravity, UA: 1.027 (ref 1.005–1.030)
Urobilinogen, Ur: 1 mg/dL (ref 0.2–1.0)
pH, UA: 6.5 (ref 5.0–7.5)

## 2022-08-29 LAB — TSH+FREE T4
Free T4: 1.06 ng/dL (ref 0.82–1.77)
TSH: 0.666 u[IU]/mL (ref 0.450–4.500)

## 2022-08-29 LAB — HEMOGLOBIN A1C
Est. average glucose Bld gHb Est-mCnc: 126 mg/dL
Hgb A1c MFr Bld: 6 % — ABNORMAL HIGH (ref 4.8–5.6)

## 2022-08-29 LAB — LIPID PANEL
Chol/HDL Ratio: 3.3 ratio (ref 0.0–4.4)
Cholesterol, Total: 227 mg/dL — ABNORMAL HIGH (ref 100–199)
HDL: 69 mg/dL (ref >39)
LDL Chol Calc (NIH): 146 mg/dL — ABNORMAL HIGH (ref 0–99)
Triglycerides: 67 mg/dL (ref 0–149)
VLDL Cholesterol Cal: 12 mg/dL (ref 5–40)

## 2022-08-29 LAB — B12 AND FOLATE PANEL
Folate: 19.6 ng/mL (ref >3.0)
Vitamin B-12: 669 pg/mL (ref 232–1245)

## 2022-08-29 LAB — HCV AB W REFLEX TO QUANT PCR: HCV Ab: NONREACTIVE

## 2022-08-29 LAB — HIV ANTIBODY (ROUTINE TESTING W REFLEX): HIV Screen 4th Generation wRfx: NONREACTIVE

## 2022-08-29 LAB — HCV INTERPRETATION

## 2022-08-29 LAB — VITAMIN D 25 HYDROXY (VIT D DEFICIENCY, FRACTURES): Vit D, 25-Hydroxy: 48 ng/mL (ref 30.0–100.0)

## 2022-08-30 ENCOUNTER — Ambulatory Visit (INDEPENDENT_AMBULATORY_CARE_PROVIDER_SITE_OTHER): Payer: 59

## 2022-08-30 DIAGNOSIS — J309 Allergic rhinitis, unspecified: Secondary | ICD-10-CM | POA: Diagnosis not present

## 2022-09-04 DIAGNOSIS — R109 Unspecified abdominal pain: Secondary | ICD-10-CM | POA: Insufficient documentation

## 2022-09-04 DIAGNOSIS — Z8669 Personal history of other diseases of the nervous system and sense organs: Secondary | ICD-10-CM | POA: Insufficient documentation

## 2022-09-04 DIAGNOSIS — J45909 Unspecified asthma, uncomplicated: Secondary | ICD-10-CM | POA: Insufficient documentation

## 2022-09-04 DIAGNOSIS — E785 Hyperlipidemia, unspecified: Secondary | ICD-10-CM | POA: Insufficient documentation

## 2022-09-04 DIAGNOSIS — F419 Anxiety disorder, unspecified: Secondary | ICD-10-CM | POA: Insufficient documentation

## 2022-09-04 DIAGNOSIS — I10 Essential (primary) hypertension: Secondary | ICD-10-CM | POA: Insufficient documentation

## 2022-09-04 DIAGNOSIS — N390 Urinary tract infection, site not specified: Secondary | ICD-10-CM | POA: Insufficient documentation

## 2022-09-04 NOTE — Assessment & Plan Note (Addendum)
History of migraine headaches.  She endorses a recent increase in headache frequency, now occurring 1-3 times per week for the last 3 months.  She endorses associated photophobia, phonophobia, and nausea.  Currently using Tylenol to manage her symptoms.  She has previously been referred to neurology and has an appointment scheduled for August. -We will tentatively plan for follow-up in 1 month for reassessment.  In the interim, I have requested that she keep a headache log.

## 2022-09-04 NOTE — Assessment & Plan Note (Signed)
She is currently prescribed HCTZ 50 mg daily.  BP is acceptable today.

## 2022-09-04 NOTE — Assessment & Plan Note (Signed)
Her acute concern today is lower abdominal discomfort and cramping.  Symptoms preceded UTI symptoms. -Trial Bentyl for symptom relief

## 2022-09-04 NOTE — Assessment & Plan Note (Signed)
Asymptomatic currently.  Her pulmonary exam is unremarkable.  She is prescribed albuterol for as needed use, which is seldom.  She is also on allergen immunotherapy.

## 2022-09-04 NOTE — Assessment & Plan Note (Signed)
Previous documented history of hyperlipidemia.  She is not currently on any cholesterol-lowering therapy.  Repeat lipid panel ordered today.

## 2022-09-04 NOTE — Assessment & Plan Note (Signed)
History of DCIS of the left breast s/p lumpectomy followed by radiation in 2009.  She has completed 5 years of tamoxifen.

## 2022-09-04 NOTE — Assessment & Plan Note (Signed)
Mood stable and anxiety adequately controlled with Cymbalta and Ativan.

## 2022-09-04 NOTE — Assessment & Plan Note (Signed)
She endorses a 3-4-day history of dysuria and increased urinary frequency.  UA today is not consistent with infection, however given her symptoms, I have prescribed Macrobid for empiric treatment of UTI.

## 2022-09-06 ENCOUNTER — Ambulatory Visit (INDEPENDENT_AMBULATORY_CARE_PROVIDER_SITE_OTHER): Payer: 59

## 2022-09-06 DIAGNOSIS — J309 Allergic rhinitis, unspecified: Secondary | ICD-10-CM | POA: Diagnosis not present

## 2022-09-11 ENCOUNTER — Other Ambulatory Visit (HOSPITAL_COMMUNITY): Payer: Self-pay | Admitting: Internal Medicine

## 2022-09-11 DIAGNOSIS — Z1231 Encounter for screening mammogram for malignant neoplasm of breast: Secondary | ICD-10-CM

## 2022-09-13 ENCOUNTER — Ambulatory Visit (INDEPENDENT_AMBULATORY_CARE_PROVIDER_SITE_OTHER): Payer: 59

## 2022-09-13 DIAGNOSIS — J309 Allergic rhinitis, unspecified: Secondary | ICD-10-CM

## 2022-09-20 ENCOUNTER — Ambulatory Visit (INDEPENDENT_AMBULATORY_CARE_PROVIDER_SITE_OTHER): Payer: 59

## 2022-09-20 DIAGNOSIS — J309 Allergic rhinitis, unspecified: Secondary | ICD-10-CM | POA: Diagnosis not present

## 2022-09-27 ENCOUNTER — Ambulatory Visit (INDEPENDENT_AMBULATORY_CARE_PROVIDER_SITE_OTHER): Payer: 59

## 2022-09-27 DIAGNOSIS — J309 Allergic rhinitis, unspecified: Secondary | ICD-10-CM

## 2022-09-29 ENCOUNTER — Ambulatory Visit (INDEPENDENT_AMBULATORY_CARE_PROVIDER_SITE_OTHER): Payer: Self-pay | Admitting: Internal Medicine

## 2022-09-29 ENCOUNTER — Encounter: Payer: Self-pay | Admitting: Internal Medicine

## 2022-09-29 VITALS — BP 128/86 | HR 91 | Ht 69.0 in | Wt 263.6 lb

## 2022-09-29 DIAGNOSIS — E782 Mixed hyperlipidemia: Secondary | ICD-10-CM

## 2022-09-29 DIAGNOSIS — Z8669 Personal history of other diseases of the nervous system and sense organs: Secondary | ICD-10-CM

## 2022-09-29 DIAGNOSIS — R7303 Prediabetes: Secondary | ICD-10-CM

## 2022-09-29 NOTE — Progress Notes (Unsigned)
Established Patient Office Visit  Subjective   Patient ID: [name], female    DOB: [birth year]  Age: 62 y.o. MRN: [id number]  Chief Complaint  Patient presents with   Migraine    Follow up   Ms. Arave returns to care today for follow-up.  She was last evaluated by me on 6/17 as a new patient presenting to establish care.  At that time she endorsed a history of migraine headaches that had recently increased in frequency.  She endorsed associated photophobia, phonophobia, and nausea.  I recommended that she keep a headache log for the next month and follow-up for reassessment.  Baseline labs were ordered.  There have been no acute interval events.  Ms. Banko reports feeling fairly well today.  She states that her migraines are improving both in frequency and intensity.  She does not have any acute concerns to discuss today.  Past Medical History:  Diagnosis Date   Anxiety    Asthma    Breast cancer (HCC) 2009   Tamoxifen, left lumpectomy   Cough    Cramps, muscle, general    Depression    Diarrhea    Hypertension    Palpitation    Visual disturbance    Wheezing    Past Surgical History:  Procedure Laterality Date   ABDOMINAL HYSTERECTOMY  09/2005   still have ovaries   BIOPSY N/A 09/08/2014   Procedure: BIOPSY random colon;  Surgeon: West Bali, MD;  Location: [facility];  Service: Endoscopy;  Laterality: N/A;   BREAST SURGERY Left 09/2007   lumpectomy   COLONOSCOPY WITH PROPOFOL N/A 09/08/2014   Procedure: COLONOSCOPY WITH PROPOFOL; in cecum at 0904; withdrawal time 16 minutes;  Surgeon: West Bali, MD;  Location: [facility];  Service: Endoscopy;  Laterality: N/A;   Social History   Tobacco Use   Smoking status: Former    Current packs/day: 0.00    Types: Cigarettes    Quit date: 03/02/1990    Years since quitting: 32.6   Smokeless tobacco: Never  Vaping Use   Vaping status: Never Used  Substance Use Topics   Alcohol use: No    Alcohol/week: 0.0 standard  drinks of alcohol   Drug use: No   Family History  Problem Relation Age of Onset   Diabetes Father    Diabetes Mother    Stroke Mother    Hypertension Mother    Multiple myeloma Mother    Macular degeneration Mother    Congestive Heart Failure Mother    Hypertension Brother    Hypertension Sister    Cancer Paternal Aunt        ovarian   Cancer Cousin        colon   Colon cancer Other        no first degree relatives   Allergies  Allergen Reactions   Ceclor [Cefaclor] Other (See Comments)    Causes pain in esophagus   Erythromycin Hives and Itching   Green Tea (Camellia Sinensis) Other (See Comments)    Eyes swell   Latex     Welts    Other Itching    pineapple   Penicillins Hives and Itching   Tetracycline Hives and Itching   Passion Fruit Flavor    Amoxicillin Hives   Pineapple Itching   Tetracyclines & Related Nausea Only   Review of Systems  Constitutional:  Negative for chills and fever.  HENT:  Negative for sore throat.   Respiratory:  Negative for  cough and shortness of breath.   Cardiovascular:  Negative for chest pain, palpitations and leg swelling.  Gastrointestinal:  Negative for abdominal pain, blood in stool, constipation, diarrhea, nausea and vomiting.  Genitourinary:  Negative for dysuria and hematuria.  Musculoskeletal:  Negative for myalgias.  Skin:  Negative for itching and rash.  Neurological:  Negative for dizziness and headaches.  Psychiatric/Behavioral:  Negative for depression and suicidal ideas.      Objective:     BP 128/86   Pulse 91   Ht 5\' 9"  (1.753 m)   Wt 263 lb 9.6 oz (119.6 kg)   SpO2 97%   BMI 38.93 kg/m  BP Readings from Last 3 Encounters:  09/29/22 128/86  08/28/22 139/85  08/09/22 (!) 132/92   Physical Exam Vitals reviewed.  Constitutional:      General: She is not in acute distress.    Appearance: Normal appearance. She is obese. She is not toxic-appearing.  HENT:     Head: Normocephalic and atraumatic.      Right Ear: External ear normal.     Left Ear: External ear normal.     Nose: Nose normal. No congestion or rhinorrhea.     Mouth/Throat:     Mouth: Mucous membranes are moist.     Pharynx: Oropharynx is clear. No oropharyngeal exudate or posterior oropharyngeal erythema.  Eyes:     General: No scleral icterus.    Extraocular Movements: Extraocular movements intact.     Conjunctiva/sclera: Conjunctivae normal.     Pupils: Pupils are equal, round, and reactive to light.  Cardiovascular:     Rate and Rhythm: Normal rate and regular rhythm.     Pulses: Normal pulses.     Heart sounds: Normal heart sounds. No murmur heard.    No friction rub. No gallop.  Pulmonary:     Effort: Pulmonary effort is normal.     Breath sounds: Normal breath sounds. No wheezing, rhonchi or rales.  Abdominal:     General: Abdomen is flat. Bowel sounds are normal. There is no distension.     Palpations: Abdomen is soft.     Tenderness: There is no abdominal tenderness.  Musculoskeletal:        General: No swelling. Normal range of motion.     Cervical back: Normal range of motion.     Right lower leg: No edema.     Left lower leg: No edema.  Lymphadenopathy:     Cervical: No cervical adenopathy.  Skin:    General: Skin is warm and dry.     Capillary Refill: Capillary refill takes less than 2 seconds.     Coloration: Skin is not jaundiced.  Neurological:     General: No focal deficit present.     Mental Status: She is alert and oriented to person, place, and time.  Psychiatric:        Mood and Affect: Mood normal.        Behavior: Behavior normal.   Last CBC Lab Results  Component Value Date   WBC 4.2 08/28/2022   HGB 13.5 08/28/2022   HCT 40.2 08/28/2022   MCV 86 08/28/2022   MCH 28.8 08/28/2022   RDW 12.7 08/28/2022   PLT 223 08/28/2022   Last metabolic panel Lab Results  Component Value Date   GLUCOSE 94 08/28/2022   NA 142 08/28/2022   K 3.7 08/28/2022   CL 99 08/28/2022   CO2 28  08/28/2022   BUN 11 08/28/2022   CREATININE 0.73  08/28/2022   EGFR 95 08/28/2022   CALCIUM 9.4 08/28/2022   PROT 6.9 08/28/2022   ALBUMIN 4.1 08/28/2022   LABGLOB 2.8 08/28/2022   BILITOT 0.3 08/28/2022   ALKPHOS 100 08/28/2022   AST 23 08/28/2022   ALT 17 08/28/2022   ANIONGAP 11 09/10/2019   Last lipids Lab Results  Component Value Date   CHOL 227 (H) 08/28/2022   HDL 69 08/28/2022   LDLCALC 146 (H) 08/28/2022   TRIG 67 08/28/2022   CHOLHDL 3.3 08/28/2022   Last hemoglobin A1c Lab Results  Component Value Date   HGBA1C 6.0 (H) 08/28/2022   Last thyroid functions Lab Results  Component Value Date   TSH 0.666 08/28/2022   Last vitamin D Lab Results  Component Value Date   VD25OH 48.0 08/28/2022   Last vitamin B12 and Folate Lab Results  Component Value Date   VITAMINB12 669 08/28/2022   FOLATE 19.6 08/28/2022   The 10-year ASCVD risk score (Arnett DK, et al., 2019) is: 6.2%    Assessment & Plan:   Problem List Items Addressed This Visit       Hyperlipidemia    Lipid panel updated last month.  Total cholesterol 227 and LDL 146.  Her 10-year ASCVD risk today is 6.2%.  For now, she would like to continue focusing on lifestyle modifications aimed at weight loss and improving her cholesterol.  She endorses that there is significant improvement in regards to her diet.  She is not exercising regularly.  We reviewed that lifestyle changes are imperative in order to reduce her future risk of cardiovascular events.  We will repeat a cholesterol panel in 6 months.      Hx of migraines    Returning to care today for follow-up of migraine headaches.  She previously reported an increase in headache frequency with associated photophobia, phonophobia, and nausea.  In the interim, she states that her headaches have improved.  She is not interested in starting any medication for migraine prevention or abortive therapy.      Prediabetes - Primary    A1c 6.0 on labs from last  month.  For now, she prefers to focus on last on modifications aimed at improving her blood sugar.  She endorses that there is room for significant improvement from a dietary standpoint.  She is also not exercising regularly.      Return in about 6 months (around 04/01/2023).   Billie Lade, MD

## 2022-09-29 NOTE — Patient Instructions (Signed)
It was a pleasure to see you today.  Thank you for giving [place] the opportunity to be involved in your care.  Below is a brief recap of your visit and next steps.  We will plan to see you again in 6 months.  Summary No medication changes today Please focus on dietary changes aimed at improving your blood sugar and cholesterol. Follow up in 6 months.

## 2022-10-04 ENCOUNTER — Ambulatory Visit (INDEPENDENT_AMBULATORY_CARE_PROVIDER_SITE_OTHER): Payer: 59

## 2022-10-04 DIAGNOSIS — R7303 Prediabetes: Secondary | ICD-10-CM | POA: Insufficient documentation

## 2022-10-04 DIAGNOSIS — J309 Allergic rhinitis, unspecified: Secondary | ICD-10-CM

## 2022-10-04 NOTE — Assessment & Plan Note (Signed)
A1c 6.0 on labs from last month.  For now, she prefers to focus on last on modifications aimed at improving her blood sugar.  She endorses that there is room for significant improvement from a dietary standpoint.  She is also not exercising regularly.

## 2022-10-04 NOTE — Assessment & Plan Note (Signed)
Lipid panel updated last month.  Total cholesterol 227 and LDL 146.  Her 10-year ASCVD risk today is 6.2%.  For now, she would like to continue focusing on lifestyle modifications aimed at weight loss and improving her cholesterol.  She endorses that there is significant improvement in regards to her diet.  She is not exercising regularly.  We reviewed that lifestyle changes are imperative in order to reduce her future risk of cardiovascular events.  We will repeat a cholesterol panel in 6 months.

## 2022-10-04 NOTE — Assessment & Plan Note (Signed)
Returning to care today for follow-up of migraine headaches.  She previously reported an increase in headache frequency with associated photophobia, phonophobia, and nausea.  In the interim, she states that her headaches have improved.  She is not interested in starting any medication for migraine prevention or abortive therapy.

## 2022-10-11 ENCOUNTER — Ambulatory Visit (INDEPENDENT_AMBULATORY_CARE_PROVIDER_SITE_OTHER): Payer: 59

## 2022-10-11 DIAGNOSIS — J309 Allergic rhinitis, unspecified: Secondary | ICD-10-CM | POA: Diagnosis not present

## 2022-10-13 ENCOUNTER — Encounter (HOSPITAL_COMMUNITY): Payer: Self-pay

## 2022-10-13 ENCOUNTER — Ambulatory Visit (HOSPITAL_COMMUNITY)
Admission: RE | Admit: 2022-10-13 | Discharge: 2022-10-13 | Disposition: A | Discharge status: Home / Self Care | Payer: 59 | Source: Ambulatory Visit | Attending: Internal Medicine | Admitting: Internal Medicine

## 2022-10-13 DIAGNOSIS — Z1231 Encounter for screening mammogram for malignant neoplasm of breast: Secondary | ICD-10-CM | POA: Diagnosis not present

## 2022-10-13 HISTORY — DX: Personal history of irradiation: Z92.3

## 2022-10-15 ENCOUNTER — Telehealth: Payer: Self-pay | Admitting: Allergy & Immunology

## 2022-10-15 DIAGNOSIS — G8929 Other chronic pain: Secondary | ICD-10-CM

## 2022-10-15 NOTE — Telephone Encounter (Signed)
Patient requested brain MRI. Neurology recommended a brain MRI with and without contrast. I ordered the study.   Theresa Bonds, MD [facility]

## 2022-10-18 ENCOUNTER — Ambulatory Visit (INDEPENDENT_AMBULATORY_CARE_PROVIDER_SITE_OTHER): Payer: 59

## 2022-10-18 DIAGNOSIS — J309 Allergic rhinitis, unspecified: Secondary | ICD-10-CM | POA: Diagnosis not present

## 2022-10-19 NOTE — Telephone Encounter (Signed)
Called patient's insurance - [id number] - advised MRI Brain w/wo contrast required precertification/prior authorization - given [employer] contact information.  Contacted [employer]/[phone] - spoke to Mrs. Barrett, CSR - DOB verified - MRI Brain w/wo contrast APPROVED:  CPT: 62952 ICD: R51.9, G89.29 Case#: [id number] Approved: 10/19/22 - 04/17/23  Site: [facility] Location: [address] [place], [place] [address]  [employer] will contact patient to schedule MRI Brain w/wo contrast - per recording.  Called patient - DOB/DPR verified - LMOVM regarding above notation.  Forwarding message to provider as update.

## 2022-10-23 NOTE — Telephone Encounter (Signed)
Thanks so much!   Salvatore Marvel, MD [facility]

## 2022-10-25 ENCOUNTER — Ambulatory Visit (INDEPENDENT_AMBULATORY_CARE_PROVIDER_SITE_OTHER): Payer: 59

## 2022-10-25 DIAGNOSIS — J309 Allergic rhinitis, unspecified: Secondary | ICD-10-CM | POA: Diagnosis not present

## 2022-11-01 ENCOUNTER — Ambulatory Visit (INDEPENDENT_AMBULATORY_CARE_PROVIDER_SITE_OTHER): Payer: 59

## 2022-11-01 ENCOUNTER — Telehealth: Payer: Self-pay | Admitting: Internal Medicine

## 2022-11-01 DIAGNOSIS — J309 Allergic rhinitis, unspecified: Secondary | ICD-10-CM | POA: Diagnosis not present

## 2022-11-01 MED ORDER — LORAZEPAM 0.5 MG PO TABS: 0.5000 mg | ORAL_TABLET | Freq: Three times a day (TID) | ORAL | 0 refills | Status: DC | PRN

## 2022-11-01 NOTE — Telephone Encounter (Signed)
Lvm

## 2022-11-01 NOTE — Progress Notes (Signed)
VIAL EXP 11-01-23

## 2022-11-01 NOTE — Telephone Encounter (Signed)
Patient calling needing a refill on LORazepam (ATIVAN) 0.5 MG tablet [[id number]]  Please advise [facility] in [place], thank you

## 2022-11-02 DIAGNOSIS — J301 Allergic rhinitis due to pollen: Secondary | ICD-10-CM

## 2022-11-08 ENCOUNTER — Ambulatory Visit (INDEPENDENT_AMBULATORY_CARE_PROVIDER_SITE_OTHER): Payer: Self-pay

## 2022-11-08 DIAGNOSIS — J309 Allergic rhinitis, unspecified: Secondary | ICD-10-CM

## 2022-11-10 ENCOUNTER — Ambulatory Visit: Payer: 59 | Admitting: Psychiatry

## 2022-11-17 ENCOUNTER — Ambulatory Visit (INDEPENDENT_AMBULATORY_CARE_PROVIDER_SITE_OTHER): Payer: 59

## 2022-11-17 DIAGNOSIS — J309 Allergic rhinitis, unspecified: Secondary | ICD-10-CM | POA: Diagnosis not present

## 2022-11-22 ENCOUNTER — Ambulatory Visit (INDEPENDENT_AMBULATORY_CARE_PROVIDER_SITE_OTHER): Payer: 59

## 2022-11-22 DIAGNOSIS — J309 Allergic rhinitis, unspecified: Secondary | ICD-10-CM

## 2022-11-29 ENCOUNTER — Ambulatory Visit (INDEPENDENT_AMBULATORY_CARE_PROVIDER_SITE_OTHER): Payer: Self-pay

## 2022-11-29 DIAGNOSIS — J309 Allergic rhinitis, unspecified: Secondary | ICD-10-CM | POA: Diagnosis not present

## 2022-12-03 ENCOUNTER — Other Ambulatory Visit: Payer: Self-pay | Admitting: Internal Medicine

## 2022-12-06 ENCOUNTER — Ambulatory Visit (INDEPENDENT_AMBULATORY_CARE_PROVIDER_SITE_OTHER): Payer: Self-pay

## 2022-12-06 DIAGNOSIS — J309 Allergic rhinitis, unspecified: Secondary | ICD-10-CM | POA: Diagnosis not present

## 2022-12-15 ENCOUNTER — Other Ambulatory Visit: Payer: Self-pay

## 2022-12-15 ENCOUNTER — Ambulatory Visit: Payer: Self-pay | Admitting: Allergy & Immunology

## 2022-12-15 ENCOUNTER — Encounter: Payer: Self-pay | Admitting: Allergy & Immunology

## 2022-12-15 DIAGNOSIS — R0602 Shortness of breath: Secondary | ICD-10-CM

## 2022-12-15 DIAGNOSIS — R5383 Other fatigue: Secondary | ICD-10-CM | POA: Diagnosis not present

## 2022-12-15 DIAGNOSIS — J4531 Mild persistent asthma with (acute) exacerbation: Secondary | ICD-10-CM

## 2022-12-15 MED ORDER — PREDNISONE 10 MG PO TABS: ORAL_TABLET | ORAL | 0 refills | Status: DC

## 2022-12-15 MED ORDER — ALBUTEROL SULFATE (2.5 MG/3ML) 0.083% IN NEBU: 2.5000 mg | INHALATION_SOLUTION | Freq: Four times a day (QID) | RESPIRATORY_TRACT | 1 refills | Status: DC | PRN

## 2022-12-15 NOTE — Progress Notes (Signed)
RE: [name] MRN: [id number] DOB: April 08, 1960 Date of Telemedicine Visit: 12/15/2022  Referring provider: Billie Lade, MD Primary care provider: Billie Lade, MD  Chief Complaint: Cough (Rattle in the chest, negative covid test thus far, body aches, chills. Wonders if she has pneumonia. Has some shortness or breath and winded. )   Telemedicine Follow Up Visit via Telephone: I connected with [name] for a follow up on 12/15/22 by telephone and verified that I am speaking with the correct person using two identifiers.   I discussed the limitations, risks, security and privacy concerns of performing an evaluation and management service by telephone and the availability of in person appointments. I also discussed with the patient that there may be a patient responsible charge related to this service. The patient expressed understanding and agreed to proceed.  Patient is at home..  Provider is at the office.  Visit start time: 11:09 AM Visit end time: 11:30 AM Insurance consent/check in by: [facility] consent and medical assistant/nurse: Dr. Reece Agar  History of Present Illness:  She is a 62 y.o. female, who is being followed for allergic rhinitis as well as asthma. Her previous allergy office visit was in May 2024 with myself.  She was last seen in May 2024.  At that time, her lung testing looked amazing.  We continue with albuterol as needed and Symbicort added during flares.  For her rhinitis, we continued with her over-the-counter antihistamine as well as Flonase 1 spray per nostril daily.  She also was doing well with allergy shots.  We continue with omeprazole 40 mg daily.  For her poison ivy, we started clobetasol twice daily for 2 to 3 weeks to see if this would get her through symptoms.  Since last visit, she has not done well.   She reports that she started feeling bad around Monday this past week. She thinks that the high humidity caused a lot of this. She has  gradually felt worse over the course of the week. She did not get her shot last Wednesday. She did a COVID test that was negative at that point. She did another COVID test that was negative. Of note, her father had COVID in August. She has had a low grade fever over the course of the week. She does have some chills as well. She has just felt a dearth of energy. She does report some difficulty with taking deep breaths. She does have a nebulizer machine in the attic, but she is not sure that it works. She would like another nebulizer.   She does not think that she needs an antibiotic at this point. But she will keep in touch with [place] to let us know how she is doing. She is not sure about any influenza exposures.   Her brother was recently diagnosed with prostate cancer. They are now thinking that he has cancer somewhere else. They are all living with their father.   Otherwise, there have been no changes to her past medical history, surgical history, family history, or social history.  Assessment and Plan:  [name] is a 62 y.o. 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 Hopefully we will avoid needing an antibiotic. We are going to get a CXR to make sure that  there is no lobar pneumonia present. We are going to provide her with an albuterol nebulizer machine today and send in some solution for her to use to get her through this. She will keep in touch with [place] and give [place] an update.   Diagnostics: None.  Medication List:  Current Outpatient Medications  Medication Sig Dispense Refill   albuterol (VENTOLIN HFA) 108 (90 Base) MCG/ACT inhaler Inhale 2 puffs into the lungs every 6 (six) hours as needed for wheezing or shortness of breath. 1 each 1   aspirin EC 81 MG tablet  Take 81 mg by mouth daily. Swallow whole.     CHOLINE-INOSITOL-METHIONINE-FA PO Take by mouth.     clobetasol ointment (TEMOVATE) 0.05 % Apply 1 Application topically 2 (two) times daily. Use for 2-3 weeks tops. 30 g 1   DULoxetine (CYMBALTA) 60 MG capsule Take 60 mg by mouth daily.     ELDERBERRY PO Take by mouth.     EPIPEN 2-PAK 0.3 MG/0.3ML SOAJ injection Inject 0.3 mg into the muscle as needed for anaphylaxis. 2 each 1   Glucosamine HCl (GLUCOSAMINE PO) Take by mouth.     hydrochlorothiazide (HYDRODIURIL) 50 MG tablet Take 50 mg by mouth daily.     LORazepam (ATIVAN) 0.5 MG tablet TAKE 1 TABLET BY MOUTH EVERY 8 HOURS AS NEEDED FOR ANXIETY 90 tablet 0   Multiple Vitamins-Minerals (MULTIVITAMIN WITH MINERALS) tablet Take 1 tablet by mouth daily. Vitamin D3 is in it (1,000Ius)     OIL OF [place] PO Take by mouth.     Olopatadine HCl (PATADAY) 0.2 % SOLN Place 1 drop into both eyes 1 day or 1 dose. 2.5 mL 5   OVER THE COUNTER MEDICATION Take by mouth daily. Sugar Blockers     potassium chloride SA (K-DUR,KLOR-CON) 20 MEQ tablet Take 20 mEq by mouth daily.     Probiotic Product (PROBIOTIC-10 PO) Take 10 mg by mouth daily. Prebiotic/Probiotic     UNABLE TO FIND Med Name: DIM Supplement     No current facility-administered medications for this visit.   Allergies: Allergies  Allergen Reactions   Ceclor [Cefaclor] Other (See Comments)    Causes pain in esophagus   Erythromycin Hives and Itching   Green Tea (Camellia Sinensis) Other (See Comments)    Eyes swell   Latex     Welts    Other Itching    pineapple   Penicillins Hives and Itching   Tetracycline Hives and Itching   Passion Fruit Flavor    Amoxicillin Hives   Pineapple Itching   Tetracyclines & Related Nausea Only   I reviewed her past medical history, social history, family history, and environmental history and no significant changes have been reported from previous visits.  Review of Systems  Constitutional: Negative.   Negative for appetite change and fever.  HENT:  Positive for sinus pressure. Negative for congestion, ear discharge, ear pain, sinus pain and sore throat.   Eyes:  Negative for pain, discharge, redness and itching.  Respiratory:  Positive for cough, shortness of breath and wheezing.   Cardiovascular:  Positive for chest pain. Negative for palpitations.  Gastrointestinal:  Negative for abdominal pain.  Skin: Negative.  Negative for rash.  Allergic/Immunologic: Positive for environmental allergies. Negative for food allergies.  Neurological:  Negative for dizziness and headaches.  Hematological:  Does not bruise/bleed easily.    Objective:  Physical exam not obtained as encounter was done via telephone.   Previous notes and tests were reviewed.  I discussed the assessment and treatment plan with the patient. The patient was provided an opportunity to ask questions and all were answered. The patient agreed with the plan and demonstrated an understanding of the instructions.   The patient was advised to call back or seek an in-person evaluation if the symptoms worsen or if the condition fails to improve as anticipated.  I provided 21 minutes of non-face-to-face time during this encounter.  It was my pleasure to participate in [facility]'s care today. Please feel free to contact me with any questions or concerns.   Sincerely,  Alfonse Spruce, MD

## 2022-12-22 ENCOUNTER — Ambulatory Visit (INDEPENDENT_AMBULATORY_CARE_PROVIDER_SITE_OTHER): Payer: Self-pay

## 2022-12-22 DIAGNOSIS — J309 Allergic rhinitis, unspecified: Secondary | ICD-10-CM | POA: Diagnosis not present

## 2022-12-27 ENCOUNTER — Ambulatory Visit (INDEPENDENT_AMBULATORY_CARE_PROVIDER_SITE_OTHER): Payer: Self-pay

## 2022-12-27 DIAGNOSIS — J309 Allergic rhinitis, unspecified: Secondary | ICD-10-CM

## 2023-01-03 ENCOUNTER — Ambulatory Visit (INDEPENDENT_AMBULATORY_CARE_PROVIDER_SITE_OTHER): Payer: Self-pay

## 2023-01-03 DIAGNOSIS — J309 Allergic rhinitis, unspecified: Secondary | ICD-10-CM

## 2023-01-04 ENCOUNTER — Other Ambulatory Visit: Payer: Self-pay | Admitting: Internal Medicine

## 2023-01-10 ENCOUNTER — Ambulatory Visit (INDEPENDENT_AMBULATORY_CARE_PROVIDER_SITE_OTHER): Payer: Self-pay

## 2023-01-10 ENCOUNTER — Ambulatory Visit (HOSPITAL_COMMUNITY)
Admission: RE | Admit: 2023-01-10 | Discharge: 2023-01-10 | Disposition: A | Discharge status: Home / Self Care | Payer: 59 | Source: Ambulatory Visit | Attending: Allergy & Immunology | Admitting: Allergy & Immunology

## 2023-01-10 ENCOUNTER — Telehealth: Payer: Self-pay | Admitting: Allergy & Immunology

## 2023-01-10 DIAGNOSIS — J309 Allergic rhinitis, unspecified: Secondary | ICD-10-CM

## 2023-01-10 DIAGNOSIS — R0602 Shortness of breath: Secondary | ICD-10-CM | POA: Diagnosis present

## 2023-01-10 NOTE — Telephone Encounter (Signed)
Patient called stating she just got a chest Xray and wants to inform Dr. Dellis Anes to get the results quicker.

## 2023-01-11 NOTE — Telephone Encounter (Signed)
Tried calling pt but it never went thru with her Software engineer

## 2023-01-11 NOTE — Telephone Encounter (Signed)
Patient called in requesting chest x ray results. Patient is requesting they be resulted as soon as possible.

## 2023-01-11 NOTE — Telephone Encounter (Signed)
Pt informed of this.

## 2023-01-15 DIAGNOSIS — J302 Other seasonal allergic rhinitis: Secondary | ICD-10-CM

## 2023-01-15 NOTE — Progress Notes (Signed)
EXP 01/15/24

## 2023-01-16 NOTE — Telephone Encounter (Signed)
Agree with Dr. Delorse Lek - looks decent to me. How is the cough/SOB?   Malachi Bonds, MD [facility]

## 2023-01-16 NOTE — Telephone Encounter (Signed)
Called patient - DOB verified - advised of provider notation below.  Patient stated she was feeling a little better - going in the right direction.  Forwarding update to provider.

## 2023-02-15 ENCOUNTER — Other Ambulatory Visit: Payer: Self-pay | Admitting: Internal Medicine

## 2023-02-16 ENCOUNTER — Ambulatory Visit: Payer: Self-pay | Admitting: Allergy & Immunology

## 2023-02-27 ENCOUNTER — Encounter: Payer: Self-pay | Admitting: Allergy & Immunology

## 2023-03-23 ENCOUNTER — Other Ambulatory Visit: Payer: Self-pay | Admitting: Internal Medicine

## 2023-04-02 ENCOUNTER — Ambulatory Visit: Payer: Self-pay | Admitting: Internal Medicine

## 2023-04-18 ENCOUNTER — Other Ambulatory Visit: Payer: Self-pay | Admitting: Allergy & Immunology

## 2023-04-18 ENCOUNTER — Ambulatory Visit (INDEPENDENT_AMBULATORY_CARE_PROVIDER_SITE_OTHER): Payer: Self-pay

## 2023-04-18 DIAGNOSIS — J309 Allergic rhinitis, unspecified: Secondary | ICD-10-CM | POA: Diagnosis not present

## 2023-04-27 ENCOUNTER — Ambulatory Visit (INDEPENDENT_AMBULATORY_CARE_PROVIDER_SITE_OTHER): Payer: No Typology Code available for payment source | Admitting: *Deleted

## 2023-04-27 ENCOUNTER — Telehealth: Payer: Self-pay

## 2023-04-27 ENCOUNTER — Encounter: Payer: Self-pay | Admitting: Internal Medicine

## 2023-04-27 ENCOUNTER — Ambulatory Visit (INDEPENDENT_AMBULATORY_CARE_PROVIDER_SITE_OTHER): Payer: No Typology Code available for payment source | Admitting: Internal Medicine

## 2023-04-27 DIAGNOSIS — I10 Essential (primary) hypertension: Secondary | ICD-10-CM

## 2023-04-27 DIAGNOSIS — R7303 Prediabetes: Secondary | ICD-10-CM

## 2023-04-27 DIAGNOSIS — F419 Anxiety disorder, unspecified: Secondary | ICD-10-CM

## 2023-04-27 DIAGNOSIS — F32A Depression, unspecified: Secondary | ICD-10-CM

## 2023-04-27 DIAGNOSIS — J309 Allergic rhinitis, unspecified: Secondary | ICD-10-CM

## 2023-04-27 MED ORDER — TIRZEPATIDE-WEIGHT MANAGEMENT 2.5 MG/0.5ML ~~LOC~~ SOLN: 2.5000 mg | SUBCUTANEOUS | 0 refills | Status: DC

## 2023-04-27 NOTE — Telephone Encounter (Unsigned)
Copied from CRM [id number]. Topic: Clinical - Medication Question >> Apr 27, 2023 10:45 AM [name] wrote: Reason for CRM: Patient states that Greggory Keen is fine. The insurance will pay for it. The other medication would be $150 even with your prior authorization. Callback number is  [phone]

## 2023-04-27 NOTE — Telephone Encounter (Signed)
Awaiting PA.

## 2023-04-27 NOTE — Progress Notes (Signed)
 Established Patient Office Visit  Subjective   Patient ID: [name], female    DOB: [birth year]  Age: 63 y.o. MRN: [id number]  Chief Complaint  Patient presents with   Obesity    Discuss weight loss options    Care Management    Six month follow up    [name] returns to care today for routine follow-up.  She was last evaluated by me in July 2024.  No medication changes were made at that time and 41-month follow-up was arranged.  In the interim, she has been evaluated by allergy and immunology.  There have otherwise been no acute interval events.  [name] reports feeling well today.  She is asymptomatic currently.  Her acute concern is obesity.  Her weight today is 277 pounds.  BMI 40.9.  She has gained 14 pounds since her last appointment despite going to the gym regularly.  She struggles with emotional eating and would like to discuss medication options for weight loss.  She specifically mentions Zepbound.  Past Medical History:  Diagnosis Date   Anxiety    Asthma    Breast cancer (HCC) 2009   Tamoxifen, left lumpectomy   Cough    Cramps, muscle, general    Depression    Diarrhea    Hypertension    Palpitation    Personal history of radiation therapy    Visual disturbance    Wheezing    Past Surgical History:  Procedure Laterality Date   ABDOMINAL HYSTERECTOMY  09/10/2005   still have ovaries   BIOPSY N/A 09/08/2014   Procedure: BIOPSY random colon;  Surgeon: West Bali, MD;  Location: [facility];  Service: Endoscopy;  Laterality: N/A;   BREAST BIOPSY Right 10/2020   Breast tissue with fibrosis.  No atypia or malignancy.   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   COLONOSCOPY WITH PROPOFOL N/A 09/08/2014   Procedure: COLONOSCOPY WITH PROPOFOL; in cecum at 0904; withdrawal time 16 minutes;  Surgeon: West Bali, MD;  Location: [facility];  Service: Endoscopy;   Laterality: N/A;   Social History   Tobacco Use   Smoking status: Former    Current packs/day: 0.00    Types: Cigarettes    Quit date: 03/02/1990    Years since quitting: 33.2   Smokeless tobacco: Never  Vaping Use   Vaping status: Never Used  Substance Use Topics   Alcohol use: No    Alcohol/week: 0.0 standard drinks of alcohol   Drug use: No   Family History  Problem Relation Age of Onset   Diabetes Father    Diabetes Mother    Stroke Mother    Hypertension Mother    Multiple myeloma Mother    Macular degeneration Mother    Congestive Heart Failure Mother    Hypertension Brother    Hypertension Sister    Cancer Paternal Aunt        ovarian   Cancer Cousin        colon   Colon cancer Other        no first degree relatives   Allergies  Allergen Reactions   Ceclor [Cefaclor] Other (See Comments)    Causes pain in esophagus   Erythromycin Hives and Itching   Green Tea (Camellia Sinensis) Other (See Comments)    Eyes swell   Latex     Welts    Other Itching  pineapple   Penicillins Hives and Itching   Tetracycline Hives and Itching   Passion Fruit Flavoring Agent (Non-Screening)    Amoxicillin Hives   Pineapple Itching   Tetracyclines & Related Nausea Only   Review of Systems  Constitutional:  Negative for chills and fever.  HENT:  Negative for sore throat.   Respiratory:  Negative for cough and shortness of breath.   Cardiovascular:  Negative for chest pain, palpitations and leg swelling.  Gastrointestinal:  Negative for abdominal pain, blood in stool, constipation, diarrhea, nausea and vomiting.  Genitourinary:  Negative for dysuria and hematuria.  Musculoskeletal:  Negative for myalgias.  Skin:  Negative for itching and rash.  Neurological:  Negative for dizziness and headaches.  Psychiatric/Behavioral:  Negative for depression and suicidal ideas.      Objective:     BP 134/78   Pulse 99   Ht 5\' 9"  (1.753 m)   Wt 277 lb 6.4 oz (125.8 kg)    SpO2 94%   BMI 40.96 kg/m  BP Readings from Last 3 Encounters:  04/27/23 134/78  09/29/22 128/86  08/28/22 139/85   Physical Exam Vitals reviewed.  Constitutional:      General: She is not in acute distress.    Appearance: Normal appearance. She is obese. She is not toxic-appearing.  HENT:     Head: Normocephalic and atraumatic.     Right Ear: External ear normal.     Left Ear: External ear normal.     Nose: Nose normal. No congestion or rhinorrhea.     Mouth/Throat:     Mouth: Mucous membranes are moist.     Pharynx: Oropharynx is clear. No oropharyngeal exudate or posterior oropharyngeal erythema.  Eyes:     General: No scleral icterus.    Extraocular Movements: Extraocular movements intact.     Conjunctiva/sclera: Conjunctivae normal.     Pupils: Pupils are equal, round, and reactive to light.  Cardiovascular:     Rate and Rhythm: Normal rate and regular rhythm.     Pulses: Normal pulses.     Heart sounds: Normal heart sounds. No murmur heard.    No friction rub. No gallop.  Pulmonary:     Effort: Pulmonary effort is normal.     Breath sounds: Normal breath sounds. No wheezing, rhonchi or rales.  Abdominal:     General: Abdomen is flat. Bowel sounds are normal. There is no distension.     Palpations: Abdomen is soft.     Tenderness: There is no abdominal tenderness.  Musculoskeletal:        General: No swelling. Normal range of motion.     Cervical back: Normal range of motion.     Right lower leg: No edema.     Left lower leg: No edema.  Lymphadenopathy:     Cervical: No cervical adenopathy.  Skin:    General: Skin is warm and dry.     Capillary Refill: Capillary refill takes less than 2 seconds.     Coloration: Skin is not jaundiced.  Neurological:     General: No focal deficit present.     Mental Status: She is alert and oriented to person, place, and time.  Psychiatric:        Mood and Affect: Mood normal.        Behavior: Behavior normal.   Last  CBC Lab Results  Component Value Date   WBC 4.2 08/28/2022   HGB 13.5 08/28/2022   HCT 40.2 08/28/2022   MCV 86  08/28/2022   MCH 28.8 08/28/2022   RDW 12.7 08/28/2022   PLT 223 08/28/2022   Last metabolic panel Lab Results  Component Value Date   GLUCOSE 94 08/28/2022   NA 142 08/28/2022   K 3.7 08/28/2022   CL 99 08/28/2022   CO2 28 08/28/2022   BUN 11 08/28/2022   CREATININE 0.73 08/28/2022   EGFR 95 08/28/2022   CALCIUM 9.4 08/28/2022   PROT 6.9 08/28/2022   ALBUMIN 4.1 08/28/2022   LABGLOB 2.8 08/28/2022   BILITOT 0.3 08/28/2022   ALKPHOS 100 08/28/2022   AST 23 08/28/2022   ALT 17 08/28/2022   ANIONGAP 11 09/10/2019   Last lipids Lab Results  Component Value Date   CHOL 227 (H) 08/28/2022   HDL 69 08/28/2022   LDLCALC 146 (H) 08/28/2022   TRIG 67 08/28/2022   CHOLHDL 3.3 08/28/2022   Last hemoglobin A1c Lab Results  Component Value Date   HGBA1C 6.0 (H) 08/28/2022   Last thyroid functions Lab Results  Component Value Date   TSH 0.666 08/28/2022   Last vitamin D Lab Results  Component Value Date   VD25OH 48.0 08/28/2022   Last vitamin B12 and Folate Lab Results  Component Value Date   VITAMINB12 669 08/28/2022   FOLATE 19.6 08/28/2022   The 10-year ASCVD risk score (Arnett DK, et al., 2019) is: 7.6%    Assessment & Plan:   Problem List Items Addressed This Visit       Essential hypertension   Remains adequately controlled with HCTZ 50 mg daily.  No medication changes are indicated today.      Anxiety and depression   Mood remains stable and anxiety adequately controlled with duloxetine and lorazepam.      Morbid obesity (HCC) - Primary   Her acute concern today is morbid obesity.  Current weight 277 pounds.  BMI 40.9.  Comorbid conditions of prediabetes and hyperlipidemia.  She is frustrated about weight gain since her last appointment because she has started going to the gym regularly.  She states that she struggles with emotional  eating.  [name] is interested in medication options for weight loss today.  She specifically mentions Zepbound because it has been effective for a friend. -Lifestyle modifications aimed at weight loss were reinforced today.  Through shared decision making, Zepbound 2.5 mg weekly has been prescribed.  We will plan to gradually uptitrate the dose in 4-week increments.  Follow-up in 3 months for reassessment.      Return in about 3 months (around 07/25/2023).   Billie Lade, MD

## 2023-04-27 NOTE — Telephone Encounter (Signed)
Copied from CRM [id number]. Topic: Clinical - Prescription Issue >> Apr 27, 2023 10:06 AM Desma Mcgregor wrote: Reason for CRM: For tirzepatide [facility]) 2.5 MG/0.5ML injection vial, [employer] called in with the patient stating they need a pre auth completed and it has to show a medical necessity. Forms were faxed today. Advised this can also be completed on [email or web address]. Fax# [phone]

## 2023-04-27 NOTE — Patient Instructions (Addendum)
It was a pleasure to see you today.  Thank you for giving [place] the opportunity to be involved in your care.  Below is a brief recap of your visit and next steps.  We will plan to see you again in 3 months.  Summary We will attempt to start Zepbound for weight loss. It is imperative that you maintain healthy diet and exercise habits in an effort to see weight loss results Follow up in 3 months

## 2023-04-27 NOTE — Telephone Encounter (Signed)
Copied from CRM [id number]. Topic: Clinical - Medication Question >> Apr 27, 2023 10:18 AM [name] wrote: Reason for CRM: Patient spoke with her insurance company and they told her with a prior authorization the zepbound will be $150 out of pocket but mounjaro will be $15.Patient wants to know which option would be better. Patient can be contacted through MyChart.

## 2023-05-01 ENCOUNTER — Telehealth: Payer: Self-pay

## 2023-05-01 NOTE — Telephone Encounter (Signed)
Copied from CRM [id number]. Topic: Clinical - Prescription Issue >> May 01, 2023  8:59 AM Elle L wrote: Reason for CRM: The patient and her insurance company called in regarding the Zepbound prescription. The insurance company states that [facility] requires a step therapy of trial and failure of Metformin or a Prior Authorization that allows her to skip Metformin due to medical necessity. She states that she is not comfortable trying Metformin due to the concerns of side effects of stomach issues. The patient's call back number is [phone].   [employer] [place] [facility] [place] Fax: [phone] Phone: [phone]

## 2023-05-04 ENCOUNTER — Telehealth: Payer: Self-pay

## 2023-05-04 ENCOUNTER — Ambulatory Visit (INDEPENDENT_AMBULATORY_CARE_PROVIDER_SITE_OTHER): Payer: No Typology Code available for payment source

## 2023-05-04 DIAGNOSIS — J309 Allergic rhinitis, unspecified: Secondary | ICD-10-CM | POA: Diagnosis not present

## 2023-05-04 NOTE — Telephone Encounter (Signed)
Patient advised awaiting on PA

## 2023-05-04 NOTE — Telephone Encounter (Signed)
Copied from CRM [id number]. Topic: Clinical - Prescription Issue >> May 04, 2023 11:56 AM [name] wrote: Reason for CRM: Patient calling in regarding Mounjaro that Dr. Durwin Nora was supposed to call in for her when she saw him at her last appointment last Friday.  She would like someone from the office to reach out to her today regarding this.

## 2023-05-04 NOTE — Telephone Encounter (Signed)
Patient came by office for [facility], saying needs a prior authorization needs to know what is going on with medicine.

## 2023-05-06 NOTE — Assessment & Plan Note (Signed)
 Mood remains stable and anxiety adequately controlled with duloxetine and lorazepam.

## 2023-05-06 NOTE — Assessment & Plan Note (Signed)
 Remains adequately controlled with HCTZ 50 mg daily.  No medication changes are indicated today.

## 2023-05-06 NOTE — Assessment & Plan Note (Signed)
 Her acute concern today is morbid obesity.  Current weight 277 pounds.  BMI 40.9.  Comorbid conditions of prediabetes and hyperlipidemia.  She is frustrated about weight gain since her last appointment because she has started going to the gym regularly.  She states that she struggles with emotional eating.  [name] is interested in medication options for weight loss today.  She specifically mentions Zepbound because it has been effective for a friend. -Lifestyle modifications aimed at weight loss were reinforced today.  Through shared decision making, Zepbound 2.5 mg weekly has been prescribed.  We will plan to gradually uptitrate the dose in 4-week increments.  Follow-up in 3 months for reassessment.

## 2023-05-09 ENCOUNTER — Ambulatory Visit (INDEPENDENT_AMBULATORY_CARE_PROVIDER_SITE_OTHER): Payer: Self-pay

## 2023-05-09 DIAGNOSIS — J309 Allergic rhinitis, unspecified: Secondary | ICD-10-CM | POA: Diagnosis not present

## 2023-05-10 ENCOUNTER — Other Ambulatory Visit: Payer: Self-pay | Admitting: Internal Medicine

## 2023-05-10 ENCOUNTER — Telehealth: Payer: Self-pay | Admitting: Internal Medicine

## 2023-05-10 ENCOUNTER — Ambulatory Visit: Payer: Self-pay | Admitting: Family Medicine

## 2023-05-10 DIAGNOSIS — R7303 Prediabetes: Secondary | ICD-10-CM

## 2023-05-10 NOTE — Telephone Encounter (Signed)
 Copied from CRM [id number]. Topic: Clinical - Medication Question >> May 10, 2023  9:50 AM Alvino Blood C wrote: Reason for CRM: Patient is calling to get the status of a Prior Auth for [facility]. New script  and prior auth needs to indicate it's used to lower patient A1C.  Insurance company [employer]) states in order for request to be fulfilled at the pharmacy the script needs to indicate Monjuro because that's what the insurance will cover. Fax # where prior auth needs to be sent to is [phone]

## 2023-05-10 NOTE — Telephone Encounter (Signed)
 PA denied.

## 2023-05-14 MED ORDER — TIRZEPATIDE 2.5 MG/0.5ML ~~LOC~~ SOAJ
2.5000 mg | SUBCUTANEOUS | 0 refills | Status: DC
Start: 2023-05-14 — End: 2023-05-22

## 2023-05-14 NOTE — Addendum Note (Signed)
 Addended by: Billie Lade on: 05/14/2023 08:56 AM   Modules accepted: Orders

## 2023-05-15 ENCOUNTER — Telehealth: Payer: Self-pay | Admitting: Pharmacy Technician

## 2023-05-15 ENCOUNTER — Telehealth: Payer: Self-pay | Admitting: Internal Medicine

## 2023-05-15 ENCOUNTER — Other Ambulatory Visit (HOSPITAL_COMMUNITY): Payer: Self-pay

## 2023-05-15 NOTE — Telephone Encounter (Signed)
 Copied from CRM [id number]. Topic: General - Other >> May 15, 2023  9:28 AM Everette Rank wrote: Reason for CRM: NOTE: 03/04-Patient called in requesting information regarding the PA for [facility]. Patient states she contacted her insurance and representative communicated that a PA had not been completed. Rep advised patient to have our office call [phone] regarding PA. I contacted office they stated will have nurse call back patient for issue.

## 2023-05-15 NOTE — Telephone Encounter (Signed)
 Copied from CRM [id number]. Topic: Clinical - Prescription Issue >> May 15, 2023  8:52 AM Marland Kitchen D wrote: Reason for CRM: Patient states she needs Dr. Durwin Nora to send in prior authorization for [facility]. I provided patient with the notes stating the RX has been sent but she would still like a call back regarding this due to insurance company saying they haven't received the prior authorization. Patient stated she just called them this morning before calling us.

## 2023-05-15 NOTE — Telephone Encounter (Signed)
 Pharmacy Patient Advocate Encounter   Received notification from Pt Calls Messages that prior authorization for [facility] 2.5MG /0.5ML PEN is required/requested.   Insurance verification completed.   The patient is insured through  [employer]  .   Patient does not meet coverage criteria for Moujaro. Pt has prediabetes and patient must have type 2 for coverage for Mounjaro. Even with a diagnosis for type 2 she would have to try/fail metformin before being approved for coverage for [facility]. Please advise.

## 2023-05-15 NOTE — Telephone Encounter (Signed)
 Spoke to patient and informed her that the RX prior authorization team would submit PA and that it could take up to 72 hours after submission to receive a determination

## 2023-05-15 NOTE — Telephone Encounter (Signed)
 PA request has been Received. New Encounter has been or will be created for follow up. For additional info see Pharmacy Prior Auth telephone encounter from 05/15/2023.

## 2023-05-15 NOTE — Telephone Encounter (Signed)
 Patient called in requesting information regarding the PA for [facility]. Patient states she contacted her insurance and representative communicated that a PA had not been completed. Rep advised patient to have our office call [phone] regarding PA.

## 2023-05-15 NOTE — Telephone Encounter (Signed)
 Message was sent to prior authorization pool to assist

## 2023-05-18 ENCOUNTER — Other Ambulatory Visit: Payer: Self-pay

## 2023-05-18 ENCOUNTER — Encounter: Payer: Self-pay | Admitting: Allergy & Immunology

## 2023-05-18 ENCOUNTER — Ambulatory Visit (INDEPENDENT_AMBULATORY_CARE_PROVIDER_SITE_OTHER): Payer: No Typology Code available for payment source | Admitting: Allergy & Immunology

## 2023-05-18 ENCOUNTER — Telehealth: Payer: Self-pay | Admitting: Allergy & Immunology

## 2023-05-18 VITALS — BP 150/100 | HR 111 | Temp 98.2°F | Ht 69.0 in | Wt 277.0 lb

## 2023-05-18 DIAGNOSIS — R5383 Other fatigue: Secondary | ICD-10-CM | POA: Diagnosis not present

## 2023-05-18 DIAGNOSIS — J4530 Mild persistent asthma, uncomplicated: Secondary | ICD-10-CM

## 2023-05-18 DIAGNOSIS — J3089 Other allergic rhinitis: Secondary | ICD-10-CM | POA: Diagnosis not present

## 2023-05-18 DIAGNOSIS — R7303 Prediabetes: Secondary | ICD-10-CM

## 2023-05-18 DIAGNOSIS — L2089 Other atopic dermatitis: Secondary | ICD-10-CM

## 2023-05-18 DIAGNOSIS — J302 Other seasonal allergic rhinitis: Secondary | ICD-10-CM | POA: Diagnosis not present

## 2023-05-18 MED ORDER — AIRSUPRA 90-80 MCG/ACT IN AERO
2.0000 | INHALATION_SPRAY | RESPIRATORY_TRACT | 5 refills | Status: DC | PRN
Start: 2023-05-18 — End: 2023-09-24

## 2023-05-18 MED ORDER — AIRSUPRA 90-80 MCG/ACT IN AERO: 2.0000 | INHALATION_SPRAY | RESPIRATORY_TRACT | 0 refills | Status: DC | PRN

## 2023-05-18 MED ORDER — CLOBETASOL PROPIONATE 0.05 % EX OINT: 1.0000 | TOPICAL_OINTMENT | Freq: Two times a day (BID) | CUTANEOUS | 1 refills | Status: DC

## 2023-05-18 NOTE — Telephone Encounter (Signed)
 Lab ordered, patient advised to get blood work done

## 2023-05-18 NOTE — Progress Notes (Signed)
 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 Mild persistent asthma, uncomplicated - Lung testing looks amazing today!  - We are going to change you from albuterol to AirSupra (contains a steroid + albuterol, which helps the lungs use the albuterol more effectively).  - Sample provided (there is a $0 copay card!!) - Daily controller medication(s): NOTHING - Prior to physical activity: AirSupra 2 puffs 10-15 minutes before physical activity. - Rescue medications: AirSupra 2 puffs every 4-6 hours as needed - Asthma control goals:  * Full participation in all desired activities (may need albuterol before activity) * Albuterol use two time or less a week on average (not counting use with activity) * Cough interfering with sleep two time or less a month * Oral steroids no more than once a year * No hospitalizations.  2. Chronic rhinitis (ragweed, indoor molds, and outdoor molds) - with maintenance reached March 2024 - Continue with: over the counter antihistamines 1-2 times daily - Continue taking: Flonase (fluticasone) one spray per nostril daily - We are going to continue with the allergy shots at the same schedule.   3. GERD  - Continue omeprazole 40mg  daily to help with reflux.   4. Eczema - Continue with clobetasol twice daily as needed (only use for two weeks max since this is a strong steroid).   5. Return in about 1 year (around 05/17/2024). You can have the follow up appointment with Dr. Dellis Anes or a Nurse Practicioner (our Nurse Practitioners are excellent and always have Physician oversight!).   Subjective:   [name] is a 63 y.o. female presenting today for follow up of  Chief Complaint  Patient presents with    Allergic Rhinitis    Asthma    [name] has a history of the following: Patient Active Problem List   Diagnosis Date Noted   Morbid obesity (HCC) 05/06/2023   Prediabetes 10/04/2022   Anxiety and depression 09/04/2022   Asthma 09/04/2022   Essential hypertension 09/04/2022   Hyperlipidemia 09/04/2022   Hx of migraines 09/04/2022   Abdominal cramping 09/04/2022   History of breast cancer 10/06/2021   Hot flashes 10/06/2021   Abdominal bloating 10/05/2020   Encounter for screening fecal occult blood testing 10/05/2020   Encounter for gynecological examination with Papanicolaou smear of cervix 10/05/2020   S/P abdominal supracervical subtotal hysterectomy 10/05/2020   Encounter for screening colonoscopy 08/21/2014   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  Discussed the use of AI scribe software for clinical note transcription with the patient and/or guardian, who gave verbal consent to proceed.  [name] is a 63 y.o. female presenting for a follow up visit. We last saw her in October 2024. At that time, we ended up starting her on a prednisone taper. We got a CXR which was clear and we gave her an albuterol nebulizer machine.   Asthma/Respiratory Symptom History: She has experienced significant improvement in her asthma symptoms, with lung function improving from 67% in January 2023 to 85%. She rarely uses her albuterol inhaler and has not used her Symbicort  in a long time. Her albuterol and EpiPen are covered by insurance. There was a previous gap in receiving her allergy shots from October to February due to insurance coverage issues, but she is now back on her shots, receiving them monthly.  Allergic Rhinitis Symptom History: She remains on the Flonase which she gets over the counter. She  ha been tolerating her allergy shots without a problem.   [name] is on allergen immunotherapy. She receives one injection. Immunotherapy script #1 contains  ragweed and molds. She currently receives 0.36mL of the RED vial (1/100). She started shots June of 2023 and reached maintenance in March 2024.   Skin Symptom History: She also mentions using clobetasol for a previous poison ivy rash. She also has some mild atopic dermatitis and   GERD Symptom History: Regarding her reflux, she uses omeprazole as needed and has not required it in a long time.  She wants to lose weight but has faced insurance coverage issues for weight loss injections. Her family has also struggled with similar insurance issues. 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 She drives veterans around to doctors' appointments.   Otherwise, there have been no changes to her past medical history, surgical history, family history, or social history.    Review of systems otherwise negative other than that mentioned in the HPI.    Objective:   Blood pressure (!) 150/100, pulse (!) 111, temperature 98.2 F (36.8 C), height 5\' 9"  (1.753 m), weight 277 lb (125.6 kg), SpO2 96%. Body mass index is 40.91 kg/m.    Physical Exam Vitals reviewed.  Constitutional:      Appearance: She is well-developed.     Comments: Very talkative.  HENT:     Head: Normocephalic and atraumatic.     Right Ear: Tympanic membrane, ear canal and external ear normal.     Left Ear: Tympanic membrane, ear canal and external ear normal.     Nose: No nasal deformity, septal deviation, mucosal edema or rhinorrhea.     Right Turbinates: Enlarged, swollen and pale.     Left Turbinates: Enlarged, swollen and pale.     Right Sinus: No maxillary sinus tenderness or frontal sinus tenderness.     Left Sinus: No maxillary sinus tenderness or frontal sinus tenderness.     Comments: No nasal polyps.      Mouth/Throat:     Mouth: Mucous membranes are not pale and not dry.     Pharynx: Uvula midline.     Tonsils: 1+ on the right. 1+ on the left.  Eyes:     General: Lids are normal. Allergic shiner present.        Right eye: No discharge.        Left eye: No discharge.     Conjunctiva/sclera: Conjunctivae normal.     Right eye: Right conjunctiva is not injected. No chemosis.    Left eye: Left conjunctiva is not injected. No chemosis.    Pupils: Pupils are equal, round, and reactive to light.  Cardiovascular:     Rate and Rhythm: Normal rate and regular rhythm.     Heart sounds: Normal heart sounds.  Pulmonary:     Effort: Pulmonary effort is normal. No tachypnea, accessory muscle usage or respiratory distress.     Breath sounds: Normal breath sounds. No wheezing, rhonchi or rales.     Comments: Moving air well in all lung fields. Chest:  Chest wall: No tenderness.  Lymphadenopathy:     Cervical: No cervical adenopathy.  Skin:    Coloration: Skin is not pale.     Findings: No abrasion, erythema, petechiae or rash. Rash is not papular, urticarial or vesicular.  Neurological:     Mental Status: She is alert.  Psychiatric:        Behavior: Behavior is cooperative.      Diagnostic studies:    Spirometry: results normal (FEV1: 1.96/80%, FVC: 2.82/91%, FEV1/FVC: 70%).    Spirometry consistent with normal pattern.   Allergy Studies: none        Malachi Bonds, MD  [facility]

## 2023-05-18 NOTE — Patient Instructions (Addendum)
 1. Mild persistent asthma, uncomplicated - Lung testing looks amazing today!  - We are going to change you from albuterol to AirSupra (contains a steroid + albuterol, which helps the lungs use the albuterol more effectively).  - Sample provided (there is a $0 copay card!!) - Daily controller medication(s): NOTHING - Prior to physical activity: AirSupra 2 puffs 10-15 minutes before physical activity. - Rescue medications: AirSupra 2 puffs every 4-6 hours as needed - Asthma control goals:  * Full participation in all desired activities (may need albuterol before activity) * Albuterol use two time or less a week on average (not counting use with activity) * Cough interfering with sleep two time or less a month * Oral steroids no more than once a year * No hospitalizations.  2. Chronic rhinitis (ragweed, indoor molds, and outdoor molds) - with maintenance reached March 2024 - Continue with: over the counter antihistamines 1-2 times daily - Continue taking: Flonase (fluticasone) one spray per nostril daily - We are going to continue with the allergy shots at the same schedule.   3. GERD  - Continue omeprazole 40mg  daily to help with reflux.   4. Eczema - Continue with clobetasol twice daily as needed (only use for two weeks max since this is a strong steroid).   5. Return in about 1 year (around 05/17/2024). You can have the follow up appointment with Dr. Dellis Anes or a Nurse Practicioner (our Nurse Practitioners are excellent and always have Physician oversight!).    Please inform us of any Emergency Department visits, hospitalizations, or changes in symptoms. Call us before going to the ED for breathing or allergy symptoms since we might be able to fit you in for a sick visit. Feel free to contact us anytime with any questions, problems, or concerns.  It was a pleasure to see you again today!  Websites that have reliable patient information: 1. American Academy of Asthma, Allergy, and  Immunology: www.aaaai.org 2. Food Allergy Research and Education (FARE): foodallergy.org 3. Mothers of Asthmatics: http://www.asthmacommunitynetwork.org 4. American College of Allergy, Asthma, and Immunology: www.acaai.org      "Like" [place] on Facebook and Instagram for our latest updates!      A healthy democracy works best when [employer] participate! Make sure you are registered to vote! If you have moved or changed any of your contact information, you will need to get this updated before voting! Scan the QR codes below to learn more!          4

## 2023-05-18 NOTE — Telephone Encounter (Signed)
 Yosseline and I had spoke about this pt and the only option she has is to repeat her A1c (last checked in June 2024) to see if she has reached 6.5. She must have a diagnosis of type 2 diabetes before we could PA the [facility] or Ozempic. All GLP1's for weight loss are excluded from coverage. If you want to order the A1c, we will make her aware to come have this done

## 2023-05-18 NOTE — Telephone Encounter (Signed)
 Patient came in today for her visit and was notified of her bill. Patient is wanting to know why she owes anything.

## 2023-05-21 ENCOUNTER — Telehealth: Payer: Self-pay

## 2023-05-21 DIAGNOSIS — R7303 Prediabetes: Secondary | ICD-10-CM

## 2023-05-21 NOTE — Telephone Encounter (Signed)
 Copied from CRM [id number]. Topic: Clinical - Prescription Issue >> May 21, 2023  4:43 PM Fuller Mandril wrote: Reason for CRM: Patient called states she received a letter stating that she now has full Medicaid through [employer]. Attempted to updated insurance would not go through. States she would like to try to get Zepbound approved through medicaid because they will cover it and she has been going back and forth for a while trying to get the medication. [employer] phone  [phone]   per patient. Thank You

## 2023-05-22 ENCOUNTER — Telehealth: Payer: Self-pay | Admitting: Pharmacy Technician

## 2023-05-22 ENCOUNTER — Other Ambulatory Visit (HOSPITAL_COMMUNITY): Payer: Self-pay

## 2023-05-22 ENCOUNTER — Telehealth: Payer: Self-pay | Admitting: Internal Medicine

## 2023-05-22 MED ORDER — SEMAGLUTIDE-WEIGHT MANAGEMENT 0.25 MG/0.5ML ~~LOC~~ SOAJ: 0.2500 mg | SUBCUTANEOUS | 0 refills | Status: DC

## 2023-05-22 NOTE — Telephone Encounter (Signed)
 Pharmacy Patient Advocate Encounter   Received notification from Pt Calls Messages that prior authorization for [facility] 2.5MG /0.5ML PEN is required/requested.   Insurance verification completed.   The patient is insured through [facility] [place] .   Per test claim: PA required; PA submitted to above mentioned insurance via CoverMyMeds Key/confirmation #/EOC [id number] Status is pending

## 2023-05-22 NOTE — Telephone Encounter (Signed)
 Copied from CRM [id number]. Topic: General - Call Back - No Documentation >> May 22, 2023 10:07 AM Higinio Roger wrote: Reason for CRM: Patient would like a callback with prior authorization is ready for Zepbound.   Callback #: [id number]

## 2023-05-22 NOTE — Telephone Encounter (Signed)
 Per [facility] patient insurance prefers [employer]

## 2023-05-22 NOTE — Telephone Encounter (Signed)
 Previous crm sent to rx authorization team

## 2023-05-22 NOTE — Telephone Encounter (Signed)
 Pharmacy Patient Advocate Encounter   Received notification from Pt Calls Messages that prior authorization for [facility] 0.25MG /0.5ML PEN is required/requested.   Insurance verification completed.   The patient is insured through [facility] [place] .   Per test claim: PA required; PA started via CoverMyMeds. KEY [id number] . Waiting for clinical questions to populate.

## 2023-05-22 NOTE — Addendum Note (Signed)
 Addended by: Christel Mormon E on: 05/22/2023 04:50 PM   Modules accepted: Orders

## 2023-05-22 NOTE — Telephone Encounter (Signed)
 Copied from CRM [id number]. Topic: Clinical - Medication Question >> May 22, 2023  2:14 PM Tiffany S wrote: Reason for CRM: Hardie Shackleton Prior Berkley Harvey [phone] Verification of prior auth

## 2023-05-23 ENCOUNTER — Other Ambulatory Visit (HOSPITAL_COMMUNITY): Payer: Self-pay

## 2023-05-23 NOTE — Telephone Encounter (Signed)
 Pharmacy Patient Advocate Encounter  Received notification from [facility] Medicaid that Prior Authorization for [facility] 0.25MG /0.5ML PEN has been APPROVED from 05/22/2023 to 11/18/2023. Unable to obtain price due to refill too soon rejection, last fill date 05/22/2023 next available fill date03/31/2025   PA #/Case ID/Reference #: [id number]

## 2023-06-04 ENCOUNTER — Other Ambulatory Visit: Payer: Self-pay | Admitting: Internal Medicine

## 2023-06-04 DIAGNOSIS — R7303 Prediabetes: Secondary | ICD-10-CM

## 2023-06-04 NOTE — Telephone Encounter (Signed)
 Copied from CRM [id number]. Topic: Clinical - Medication Refill >> Jun 04, 2023 12:00 PM Geroge Baseman wrote: Most Recent Primary Care Visit:  Provider: Christel Mormon E  Department: [facility]  Visit Type: OFFICE VISIT  Date: 04/27/2023  Medication: Semaglutide-Weight Management 0.25 MG/0.5ML SOAJ  Has the patient contacted their pharmacy? No   Is this the correct pharmacy for this prescription? Yes If no, delete pharmacy and type the correct one.  This is the patient's preferred pharmacy:  [facility] [address] [place] [address] [place] [address] [place] [address] Phone: [phone] Fax: [phone]   Has the prescription been filled recently? No  Is the patient out of the medication? No  Has the patient been seen for an appointment in the last year OR does the patient have an upcoming appointment? No  Can we respond through MyChart? No  Agent: Please be advised that Rx refills may take up to 3 business days. We ask that you follow-up with your pharmacy.

## 2023-06-04 NOTE — Telephone Encounter (Unsigned)
 Copied from CRM [id number]. Topic: Clinical - Medication Refill >> Jun 04, 2023 12:00 PM Geroge Baseman wrote: Most Recent Primary Care Visit:  Provider: Christel Mormon E  Department: [facility]  Visit Type: OFFICE VISIT  Date: 04/27/2023  Medication: Semaglutide-Weight Management 0.25 MG/0.5ML SOAJ  Has the patient contacted their pharmacy? No   Is this the correct pharmacy for this prescription? Yes If no, delete pharmacy and type the correct one.  This is the patient's preferred pharmacy:  [facility] [address] [place] - [address] [place] [address] [place] [address] Phone: [phone] Fax: [phone]   Has the prescription been filled recently? No  Is the patient out of the medication? No  Has the patient been seen for an appointment in the last year OR does the patient have an upcoming appointment? No  Can we respond through MyChart? No  Agent: Please be advised that Rx refills may take up to 3 business days. We ask that you follow-up with your pharmacy.

## 2023-06-05 MED ORDER — SEMAGLUTIDE-WEIGHT MANAGEMENT 0.25 MG/0.5ML ~~LOC~~ SOAJ: 0.2500 mg | SUBCUTANEOUS | 0 refills | Status: DC

## 2023-06-12 ENCOUNTER — Other Ambulatory Visit (HOSPITAL_COMMUNITY): Payer: Self-pay

## 2023-06-12 ENCOUNTER — Telehealth: Payer: Self-pay | Admitting: Pharmacy Technician

## 2023-06-12 ENCOUNTER — Telehealth: Payer: Self-pay

## 2023-06-12 DIAGNOSIS — R7303 Prediabetes: Secondary | ICD-10-CM

## 2023-06-12 MED ORDER — WEGOVY 0.5 MG/0.5ML ~~LOC~~ SOAJ: 0.5000 mg | SUBCUTANEOUS | 0 refills | Status: DC

## 2023-06-12 NOTE — Addendum Note (Signed)
 Addended by: Christel Mormon E on: 06/12/2023 01:03 PM   Modules accepted: Orders

## 2023-06-12 NOTE — Telephone Encounter (Signed)
 Patient advised.

## 2023-06-12 NOTE — Telephone Encounter (Signed)
 Copied from CRM [id number]. Topic: Clinical - Prescription Issue >> Jun 12, 2023  9:25 AM Geroge Baseman wrote: Reason for CRM: Patient is trying to pick up wegovy,  she states she needs the 0.5mg  now per the dr, pharmacy does not have her refill. She would like a call to know where this stands, and if she needs to do anything to get this filled.

## 2023-06-12 NOTE — Telephone Encounter (Signed)
 Pharmacy Patient Advocate Encounter   Received notification from CoverMyMeds that prior authorization for [facility] 0.5MG /0.5ML auto-injectors is required/requested.   Insurance verification completed.   The patient is insured through [employer] .   Per test claim: PA required; PA submitted to above mentioned insurance via CoverMyMeds Key/confirmation #/EOC [id number] Status is pending  Patient had dual coverage and already has an approved PA on file with [facility] Medicaid.

## 2023-06-13 ENCOUNTER — Ambulatory Visit (INDEPENDENT_AMBULATORY_CARE_PROVIDER_SITE_OTHER): Payer: Self-pay

## 2023-06-13 DIAGNOSIS — J309 Allergic rhinitis, unspecified: Secondary | ICD-10-CM | POA: Diagnosis not present

## 2023-06-13 NOTE — Telephone Encounter (Signed)
 Pharmacy Patient Advocate Encounter  Received notification from [employer] / Exchange  that Prior Authorization for [employer] 0.5MG /0.5ML auto-injectors has been DENIED.  Full denial letter will be uploaded to the media tab. See denial reason below.   PA #/Case ID/Reference #: Key: [id number]    Patient has [facility] Medicaid secondary. Called patient's pharmacy to have claim reprocessed. They advised claim paid and patient has a $4.00 copay.

## 2023-06-18 ENCOUNTER — Other Ambulatory Visit (HOSPITAL_COMMUNITY): Payer: Self-pay

## 2023-06-22 ENCOUNTER — Other Ambulatory Visit: Payer: Self-pay | Admitting: Internal Medicine

## 2023-06-25 ENCOUNTER — Other Ambulatory Visit: Payer: Self-pay

## 2023-06-25 MED ORDER — HYDROCHLOROTHIAZIDE 50 MG PO TABS: 50.0000 mg | ORAL_TABLET | Freq: Every day | ORAL | 0 refills | Status: DC

## 2023-06-26 ENCOUNTER — Other Ambulatory Visit: Payer: Self-pay | Admitting: Internal Medicine

## 2023-06-26 MED ORDER — DULOXETINE HCL 60 MG PO CPEP: 60.0000 mg | ORAL_CAPSULE | Freq: Every day | ORAL | 0 refills | Status: DC

## 2023-07-02 ENCOUNTER — Ambulatory Visit: Payer: Self-pay

## 2023-07-02 ENCOUNTER — Encounter: Payer: Self-pay | Admitting: Internal Medicine

## 2023-07-02 NOTE — Telephone Encounter (Signed)
 Attempted to contact pt, [employer] assist keeps answering and placing on hold, not able to leave message.

## 2023-07-02 NOTE — Telephone Encounter (Signed)
 Attempted to contact pt. Not able to leave message.   Copied from CRM [id number]. Topic: Clinical - Medical Advice >> Jul 02, 2023  8:58 AM [name] wrote: Reason for CRM: patient called stated she has been on Semaglutide -Weight Management (WEGOVY ) 0.5 MG/0.5ML for a 6wks and does not see any results and she also feels like she is having some bumps come up on her skin so she is asking to switch over to Zepbound.

## 2023-07-02 NOTE — Telephone Encounter (Signed)
Noted appointment

## 2023-07-02 NOTE — Telephone Encounter (Signed)
 Chief Complaint: Medication question  Symptoms: rash on the forearm and shin  Frequency: Comes and goes  Pertinent Negatives: Patient denies fever, nausea, vomiting, dehydration  Disposition: [] ED /[] Urgent Care (no appt availability in office) / [x] Appointment(In office/virtual)/ []  [facility]/ [] Home Care/ [] Refused Recommended Disposition /[] [facility]/ []  Follow-up with PCP Additional Notes: Patient states she has not lost any weight since she has been taking Wegovy  but she has noticed a rash that areas on her forearm and sometimes on her shin when she takes the medication. She reports itching as well. Patient states she is ready to get this weight off and Wegovy  does not seem to be helping. Patient states her niece is taking Zepbound and denies any adverse effects. Patient states she thinks Zepbound will be better for her weight loss goals. Care advice was given and patient has been scheduled for an appointment with PCP to discuss options.      Copied from CRM [id number]. Topic: Clinical - Medical Advice >> Jul 02, 2023  8:58 AM Loreda Rodriguez T wrote: Reason for CRM: patient called stated she has been on Semaglutide -Weight Management (WEGOVY ) 0.5 MG/0.5ML for a 6wks and does not see any results and she also feels like she is having some bumps come up on her skin so she is asking to switch over to Zepbound.   Reason for Disposition  Prescription request for new medicine (not a refill)  Answer Assessment - Initial Assessment Questions 1. NAME of MEDICINE: "What medicine(s) are you calling about?"     Semaglutide -Weight Management (WEGOVY ) 0.5 MG/0.5ML 2. QUESTION: "What is your question?" (e.g., double dose of medicine, side effect)     I haven't had any results taking Wegovy , is it possible to switch to Zepbound? 3. PRESCRIBER: "Who prescribed the medicine?" Reason: if prescribed by specialist, call should be referred to that group.     Dr. Kermit Ped  4. SYMPTOMS: "Do you have  any symptoms?" If Yes, ask: "What symptoms are you having?"  "How bad are the symptoms (e.g., mild, moderate, severe)     Rash on forearm, chin area as well  5. PREGNANCY:  "Is there any chance that you are pregnant?" "When was your last menstrual period?"     N/A  Protocols used: Medication Question Call-A-AH

## 2023-07-04 ENCOUNTER — Encounter: Payer: Self-pay | Admitting: Internal Medicine

## 2023-07-04 ENCOUNTER — Other Ambulatory Visit (HOSPITAL_COMMUNITY): Payer: Self-pay

## 2023-07-04 ENCOUNTER — Telehealth (INDEPENDENT_AMBULATORY_CARE_PROVIDER_SITE_OTHER): Payer: Self-pay | Admitting: Internal Medicine

## 2023-07-04 ENCOUNTER — Telehealth: Payer: Self-pay | Admitting: Pharmacy Technician

## 2023-07-04 DIAGNOSIS — R7303 Prediabetes: Secondary | ICD-10-CM

## 2023-07-04 DIAGNOSIS — Z6841 Body Mass Index (BMI) 40.0 and over, adult: Secondary | ICD-10-CM | POA: Diagnosis not present

## 2023-07-04 DIAGNOSIS — R21 Rash and other nonspecific skin eruption: Secondary | ICD-10-CM | POA: Diagnosis not present

## 2023-07-04 MED ORDER — TIRZEPATIDE-WEIGHT MANAGEMENT 2.5 MG/0.5ML ~~LOC~~ SOLN
2.5000 mg | SUBCUTANEOUS | 0 refills | Status: DC
Start: 2023-07-04 — End: 2023-07-26

## 2023-07-04 NOTE — Assessment & Plan Note (Signed)
 She reports development of raised, itchy lesions on her left forearm, bilateral thighs, and back after completing her Wegovy  injection on 4/20.  She additionally describes having a metallic taste in her mouth but denies swelling of the tongue, lips, or airway.  I agree that this may represent an adverse reaction to Wegovy .  We discussed that she may experience similar side effects with Zepbound, which she acknowledges but remains interested in trying Zepbound.

## 2023-07-04 NOTE — Assessment & Plan Note (Signed)
 Her most recently documented weight is 277 pounds.  BMI 40.9.  She has comorbid conditions of prediabetes and hyperlipidemia.  Currently prescribed Wegovy  for weight loss but has been evaluated today with concern for adverse side effects.  She would like to switch to Zepbound if possible.  Her insurance has changed since her last appointment. -Through shared decision made, Wegovy  has been discontinued today and Zepbound 2.5 mg weekly started.  Lifestyle modifications aimed at weight loss were reinforced.  She will return to care for previously scheduled follow-up on 5/23.

## 2023-07-04 NOTE — Progress Notes (Signed)
 Virtual Visit via Video Note  I connected with [name] on 07/04/23 at  2:00 PM EDT by a video enabled telemedicine application and verified that I am speaking with the correct person using two identifiers.  Patient Location: Home Provider Location: Office/Clinic  I discussed the limitations, risks, security, and privacy concerns of performing an evaluation and management service by video and the availability of in person appointments. I also discussed with the patient that there may be a patient responsible charge related to this service. The patient expressed understanding and agreed to proceed.  Subjective: PCP: Tobi Fortes, MD  Chief Complaint  Patient presents with   Medication Management   [name] has been evaluated today for an acute visit through video encounter with concern for adverse side effects of Wegovy .  She is currently prescribed Wegovy  0.5 mg weekly.  She completed her second injection of 0.5 mg last Sunday (4/20).  Earlier this week she noted raised, itching lesions on her left forearm, thighs, and back.  She believes this is a reaction to Wegovy .  She is also concerned about a lack of weight loss since starting Wegovy .  She has a family member who is currently prescribed Zepbound and has made significant progress.  [name] insurance has recently changed to [facility] and she would like to switch to Zepbound if possible.  ROS: Per HPI  Current Outpatient Medications:    tirzepatide (ZEPBOUND) 2.5 MG/0.5ML injection vial, Inject 2.5 mg into the skin once a week., Disp: 2 mL, Rfl: 0   albuterol  (PROVENTIL ) (2.5 MG/3ML) 0.083% nebulizer solution, Take 3 mLs (2.5 mg total) by nebulization every 6 (six) hours as needed for wheezing or shortness of breath., Disp: 75 mL, Rfl: 1   albuterol  (VENTOLIN  HFA) 108 (90 Base) MCG/ACT inhaler, Inhale 2 puffs into the lungs every 6 (six) hours as needed for wheezing or shortness of breath., Disp: 1 each, Rfl: 1    Albuterol -Budesonide  (AIRSUPRA ) 90-80 MCG/ACT AERO, Inhale 2 puffs into the lungs every 4 (four) hours as needed., Disp: 10.7 g, Rfl: 5   Albuterol -Budesonide  (AIRSUPRA ) 90-80 MCG/ACT AERO, Inhale 2 puffs into the lungs every 4 (four) hours as needed., Disp: 5.9 g, Rfl: 0   aspirin EC 81 MG tablet, Take 81 mg by mouth daily. Swallow whole., Disp: , Rfl:    CHOLINE-INOSITOL-METHIONINE-FA PO, Take by mouth., Disp: , Rfl:    clobetasol  ointment (TEMOVATE ) 0.05 %, Apply 1 Application topically 2 (two) times daily. Use for 2-3 weeks tops., Disp: 30 g, Rfl: 1   DULoxetine  (CYMBALTA ) 60 MG capsule, Take 1 capsule (60 mg total) by mouth daily., Disp: 90 capsule, Rfl: 0   ELDERBERRY PO, Take by mouth., Disp: , Rfl:    EPINEPHrine  0.3 mg/0.3 mL IJ SOAJ injection, INJECT 0.3 MG INTO THE MUSCLE AS NEEDED FOR ANAPHYLAXIS, Disp: 2 each, Rfl: 1   Glucosamine HCl (GLUCOSAMINE PO), Take by mouth., Disp: , Rfl:    hydrochlorothiazide  (HYDRODIURIL ) 50 MG tablet, Take 1 tablet (50 mg total) by mouth daily., Disp: 90 tablet, Rfl: 0   LORazepam  (ATIVAN ) 0.5 MG tablet, TAKE 1 TABLET BY MOUTH EVERY 8 HOURS AS NEEDED FOR ANXIETY, Disp: 90 tablet, Rfl: 0   Multiple Vitamins-Minerals (MULTIVITAMIN WITH MINERALS) tablet, Take 1 tablet by mouth daily. Vitamin D3 is in it (1,000Ius), Disp: , Rfl:    OIL OF OREGANO PO, Take by mouth., Disp: , Rfl:    Olopatadine  HCl (PATADAY ) 0.2 % SOLN, Place 1 drop into  both eyes 1 day or 1 dose., Disp: 2.5 mL, Rfl: 5   OVER THE COUNTER MEDICATION, Take by mouth daily. Sugar Blockers, Disp: , Rfl:    potassium chloride SA (K-DUR,KLOR-CON) 20 MEQ tablet, Take 20 mEq by mouth daily., Disp: , Rfl:    Probiotic Product (PROBIOTIC-10 PO), Take 10 mg by mouth daily. Prebiotic/Probiotic, Disp: , Rfl:    UNABLE TO FIND, Med Name: DIM Supplement, Disp: , Rfl:   Assessment and Plan:  Morbid obesity (HCC) Assessment & Plan: Her most recently documented weight is 277 pounds.  BMI 40.9.  She has  comorbid conditions of prediabetes and hyperlipidemia.  Currently prescribed Wegovy  for weight loss but has been evaluated today with concern for adverse side effects.  She would like to switch to Zepbound if possible.  Her insurance has changed since her last appointment. -Through shared decision made, Wegovy  has been discontinued today and Zepbound 2.5 mg weekly started.  Lifestyle modifications aimed at weight loss were reinforced.  She will return to care for previously scheduled follow-up on 5/23.  Generalized rash Assessment & Plan: She reports development of raised, itchy lesions on her left forearm, bilateral thighs, and back after completing her Wegovy  injection on 4/20.  She additionally describes having a metallic taste in her mouth but denies swelling of the tongue, lips, or airway.  I agree that this may represent an adverse reaction to Wegovy .  We discussed that she may experience similar side effects with Zepbound, which she acknowledges but remains interested in trying Zepbound.  Follow Up Instructions: Return in about 1 month (around 08/03/2023).   I discussed the assessment and treatment plan with the patient. The patient was provided an opportunity to ask questions, and all were answered. The patient agreed with the plan and demonstrated an understanding of the instructions.   The patient was advised to call back or seek an in-person evaluation if the symptoms worsen or if the condition fails to improve as anticipated.  The above assessment and management plan was discussed with the patient. The patient verbalized understanding of and has agreed to the management plan.   Tobi Fortes, MD

## 2023-07-04 NOTE — Telephone Encounter (Signed)
 Pharmacy Patient Advocate Encounter   Received notification from CoverMyMeds that prior authorization for Zepbound 2.5MG /0.5ML pen-injectors is required/requested.   Insurance verification completed.   The patient is insured through [employer] .   Per test claim: PA required; PA submitted to above mentioned insurance via CoverMyMeds Key/confirmation #/EOC [id number] Status is pending  Patient also has [facility] Medicaid and it will need an additional PA for that plan as well.

## 2023-07-05 ENCOUNTER — Other Ambulatory Visit (HOSPITAL_COMMUNITY): Payer: Self-pay

## 2023-07-05 ENCOUNTER — Telehealth: Payer: Self-pay | Admitting: Pharmacy Technician

## 2023-07-05 NOTE — Telephone Encounter (Signed)
 Pharmacy Patient Advocate Encounter  Received notification from [employer] / Exchange that Prior Authorization for Zepbound 2.5MG /0.5ML pen-injectors has been DENIED.  Full denial letter will be uploaded to the media tab. See denial reason below.   PA #/Case ID/Reference #: Key: [id number]     Patient has secondary coverage thru [facility] Medicaid. Will try an obtain prior authorization thru secondary coverage.

## 2023-07-05 NOTE — Telephone Encounter (Signed)
 PA request has been Received. New Encounter has been or will be created for follow up. For additional info see Pharmacy Prior Auth telephone encounter from 07/05/2023.

## 2023-07-05 NOTE — Telephone Encounter (Signed)
 Pharmacy Patient Advocate Encounter   Received notification from CoverMyMeds that prior authorization for Zepbound 2.5MG /0.5ML pen-injectors is required/requested.   Insurance verification completed.   The patient is insured through [facility] [place] .   Per test claim: PA required; PA started via CoverMyMeds. KEY [id number] . Please see clinical question(s) below that I am not finding the answer to in her chart and advise.  Patient will need to come in for a wight check. It has been greater than 45 days since her last weight check.

## 2023-07-10 ENCOUNTER — Ambulatory Visit

## 2023-07-10 NOTE — Telephone Encounter (Signed)
 Pharmacy Patient Advocate Encounter   Received notification from CoverMyMeds that prior authorization for Zepbound 2.5MG /0.5ML pen-injectors is required/requested.   Insurance verification completed.   The patient is insured through [facility] [place] .   Per test claim: PA required; PA submitted to above mentioned insurance via CoverMyMeds Key/confirmation #/EOC [id number]) Status is pending

## 2023-07-11 ENCOUNTER — Other Ambulatory Visit (HOSPITAL_COMMUNITY): Payer: Self-pay

## 2023-07-11 NOTE — Telephone Encounter (Signed)
 Pharmacy Patient Advocate Encounter  Received notification from [facility] Medicaid that Prior Authorization for Zepbound 2.5MG /0.5ML pen-injectors has been APPROVED from 07/10/2023 to 01/06/2024. Ran test claim, Copay is $4.00. This test claim was processed through [facility]- copay amounts may vary at other pharmacies due to pharmacy/plan contracts, or as the patient moves through the different stages of their insurance plan.   PA #/Case ID/Reference #: [id number]

## 2023-07-15 ENCOUNTER — Other Ambulatory Visit: Payer: Self-pay | Admitting: Allergy & Immunology

## 2023-07-16 IMAGING — MG MM BREAST LOCALIZATION CLIP
4 series · 4 of 12 positions shown · non-contrast
Comparison: Previous exam(s).

CLINICAL DATA: Evaluate post biopsy marker clip placement following
ultrasound-guided core needle biopsy of a vague area of shadowing in
the right breast.

EXAM:
3D DIAGNOSTIC RIGHT MAMMOGRAM POST ULTRASOUND BIOPSY

[R CC synth-2D]
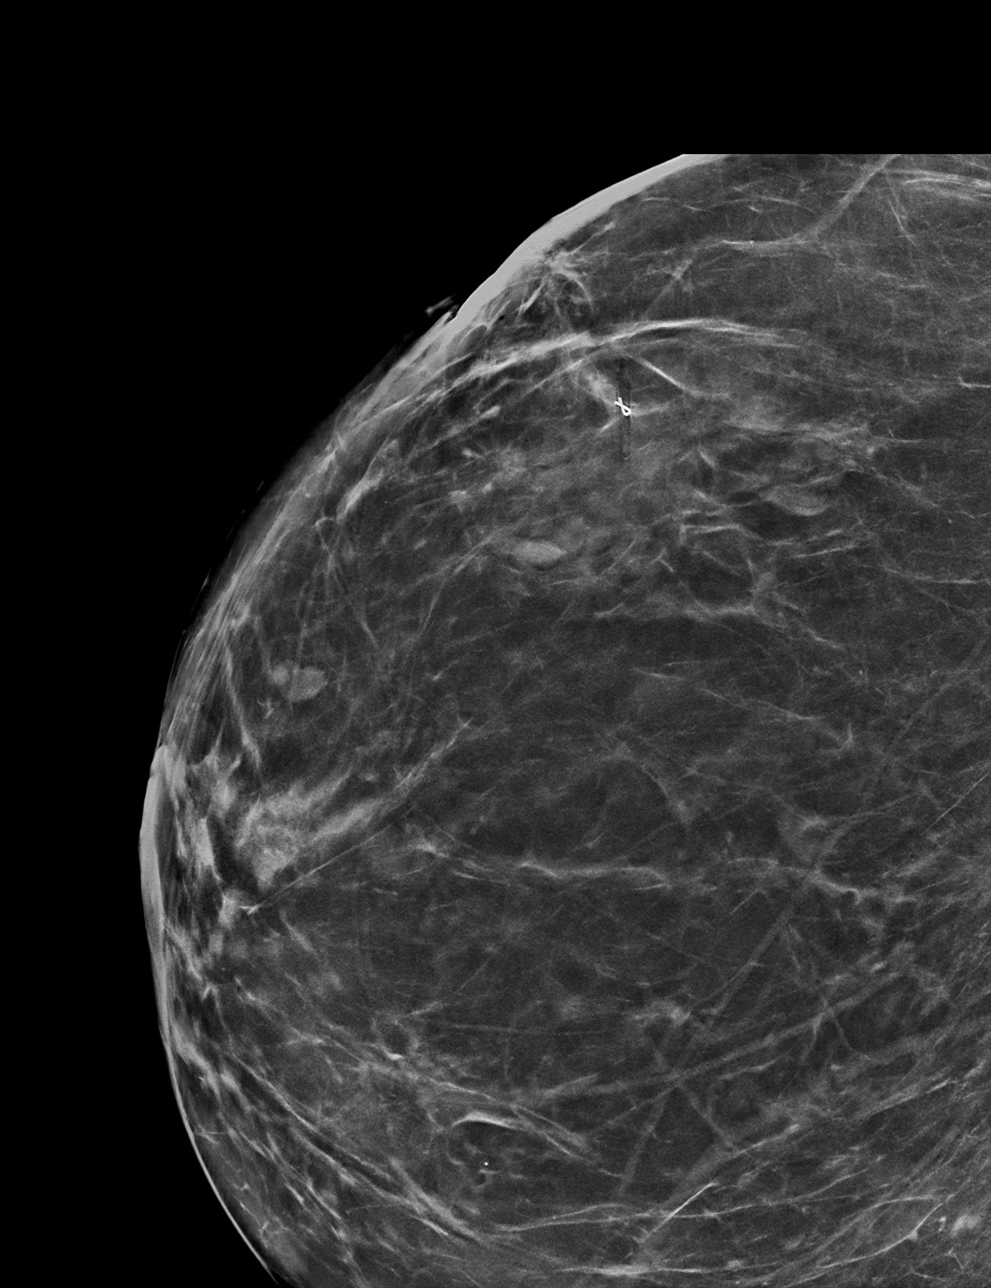

[R ML synth-2D]
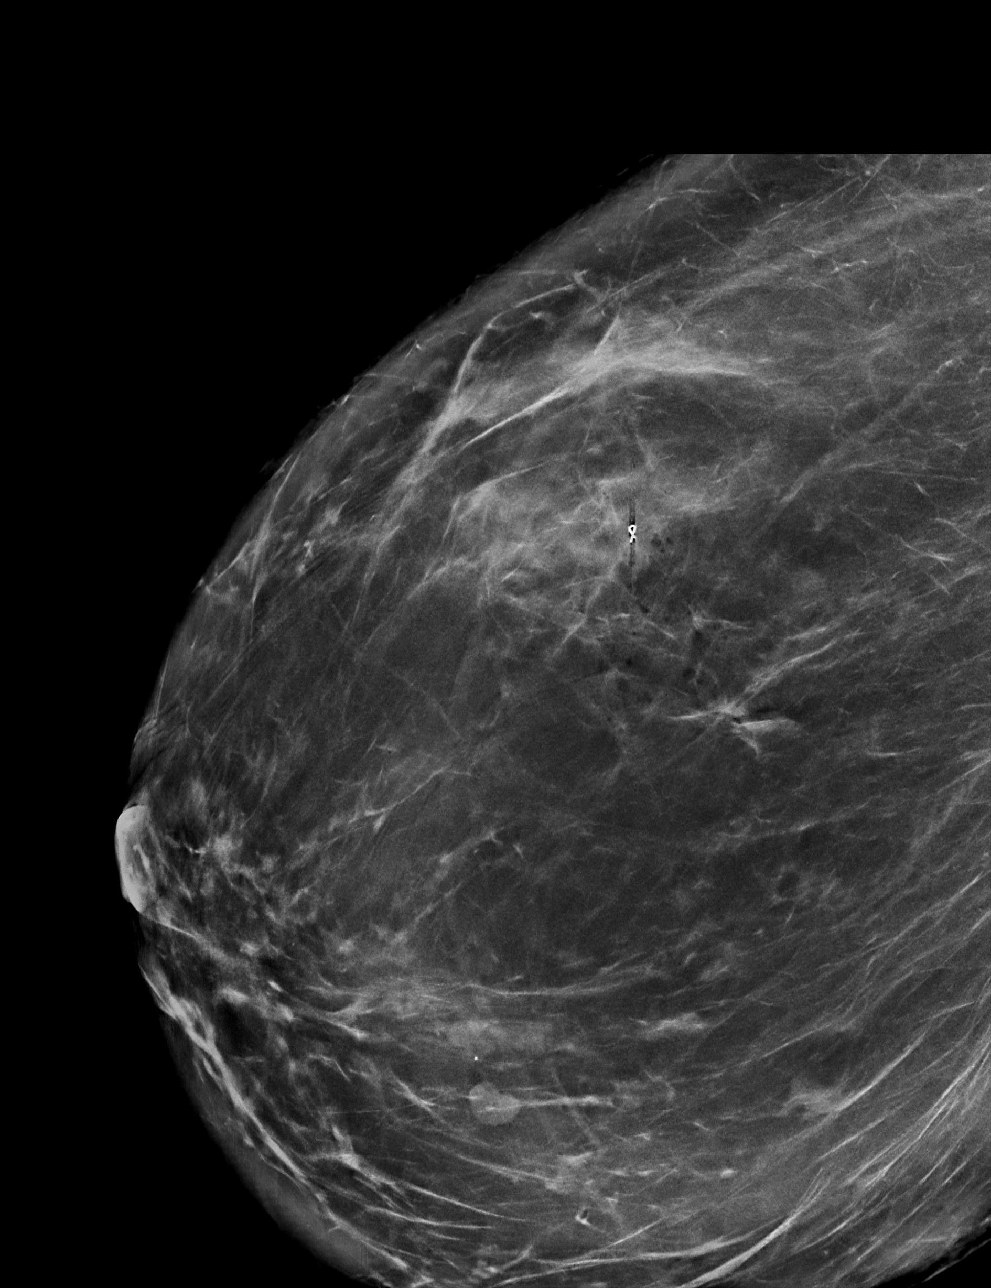

[R CC tomo · tomo slice 45/90.0]
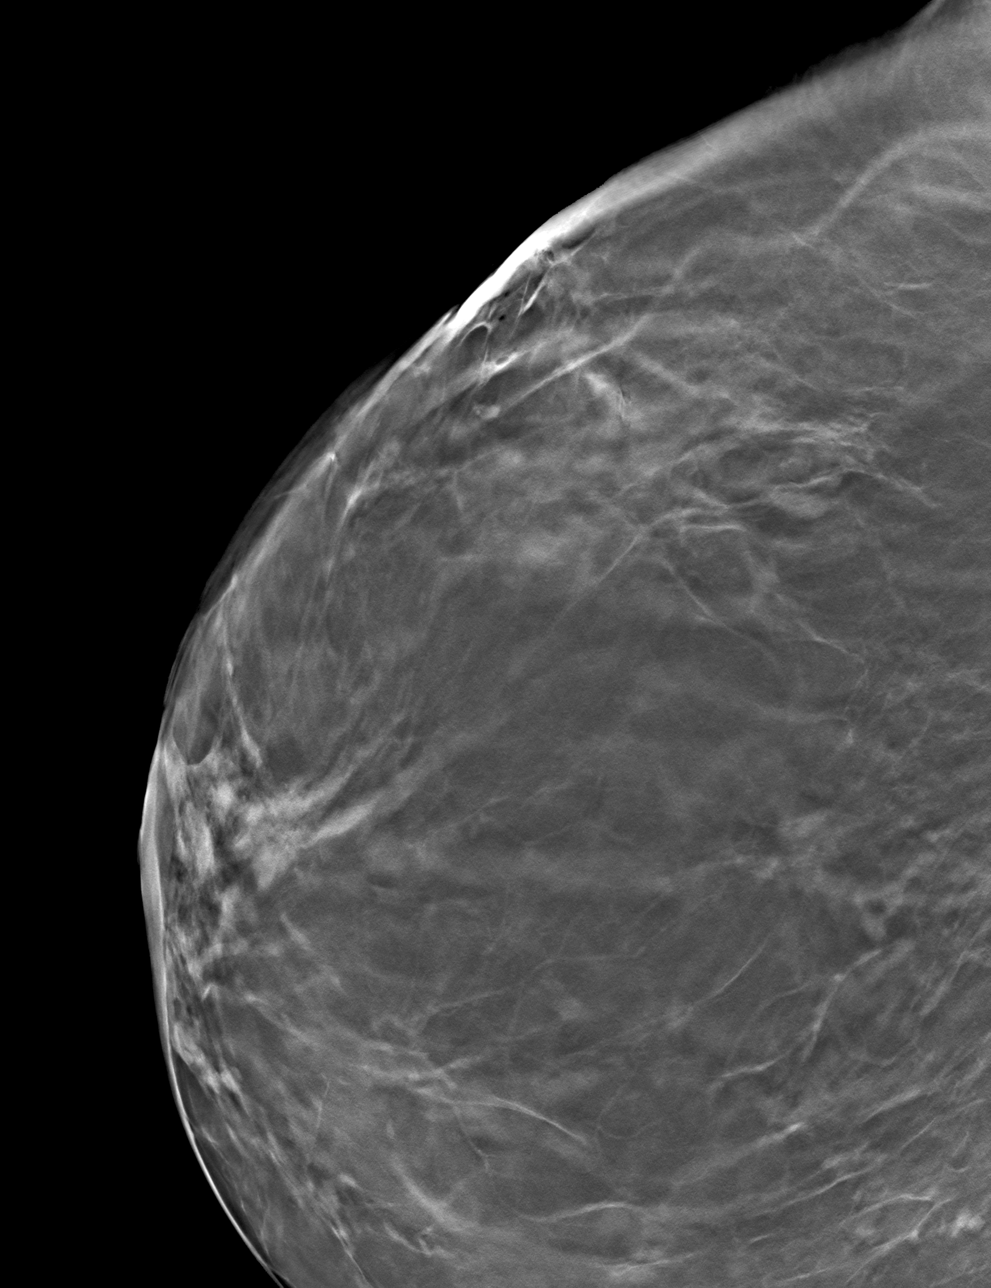

[R ML tomo · tomo slice 49/97.0]
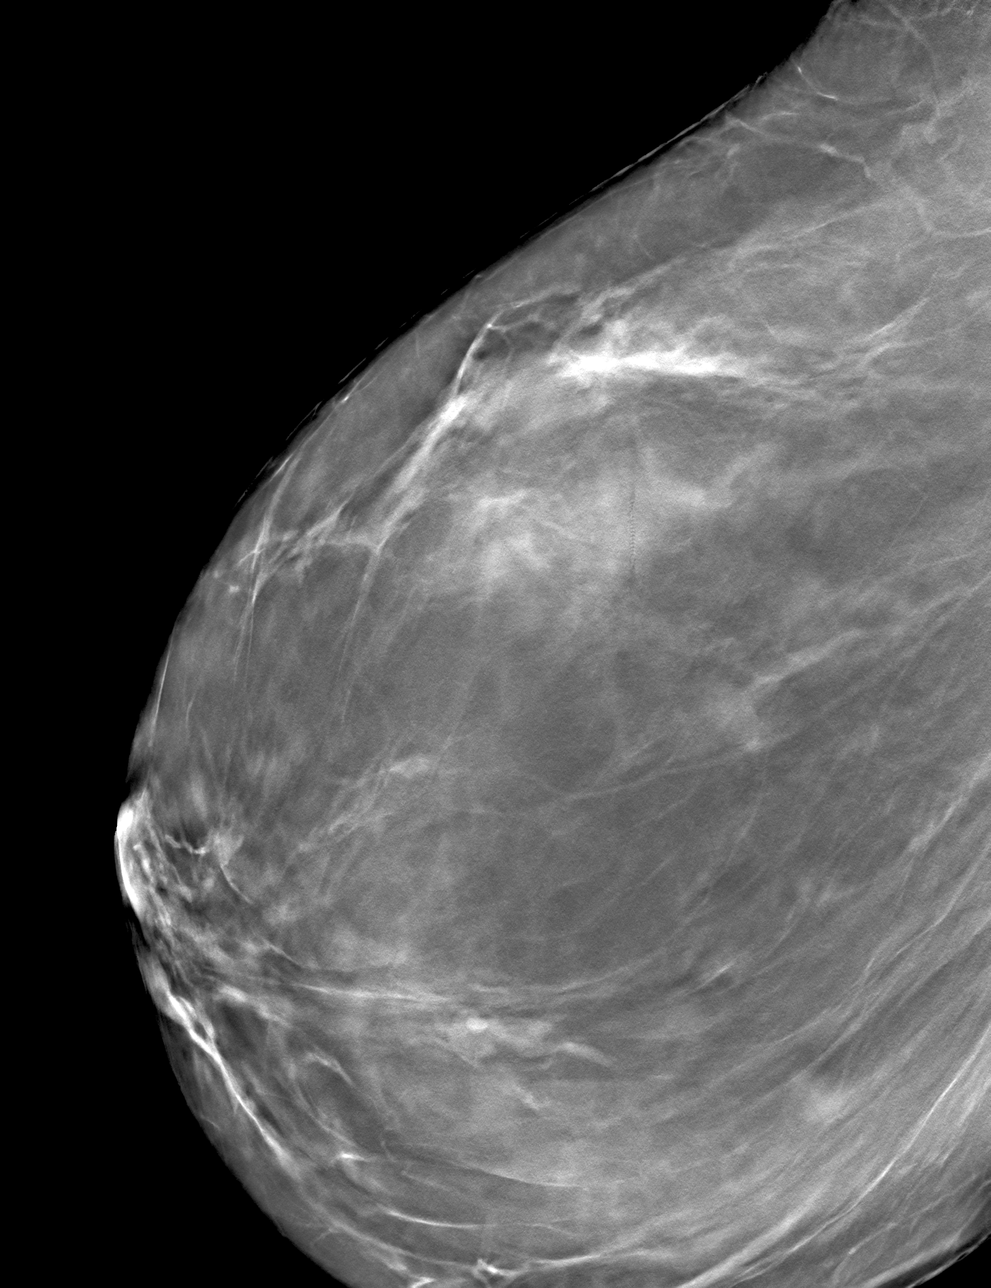

[4 of 12 positions shown; findings below may reference images not displayed]

FINDINGS: 3D Mammographic images were obtained following ultrasound guided
biopsy of a vague area of shadowing right breast between 9 and 10
o'clock. The biopsy marking clip is in expected position at the site
of biopsy.
IMPRESSION: Appropriate positioning of the ribbon shaped biopsy marking clip at
the site of biopsy in the lateral right breast between 9 and 10
o'clock.

Final Assessment: Post Procedure Mammograms for Marker Placement

## 2023-07-16 IMAGING — US US  BREAST BX W/ LOC DEV 1ST LESION IMG BX SPEC US GUIDE*R*
1 series · 12 of 14 positions shown · non-contrast
Comparison: Previous exam(s).
COMPARISON: Previous exam(s).

Addendum:
CLINICAL DATA: Patient presents for ultrasound-guided core needle
biopsy of a small vague area of shadowing right breast, which is
described at 8 o'clock, 10 cm from the nipple. At repeat sonographic
imaging, there were several areas of shadowing in the lateral and
upper outer right breast, the most definitive of which was found
between 9 and 10 o'clock, approximately 8 cm from the nipple. This
was targeted for biopsy.

EXAM:
ULTRASOUND GUIDED RIGHT BREAST CORE NEEDLE BIOPSY

[Series 1: us breast bx w/ loc dev 1st lesion img bx spec us  · 0.07mm/px · 12 of 14 slices shown]
[im 1/14]
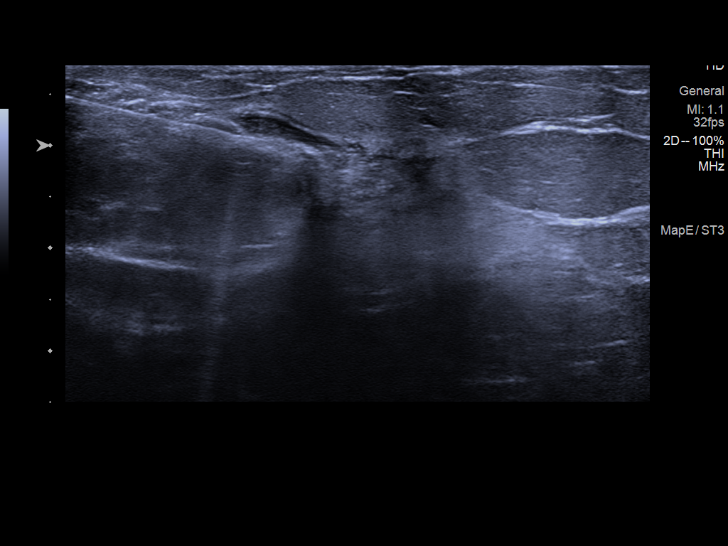
[im 2/14]
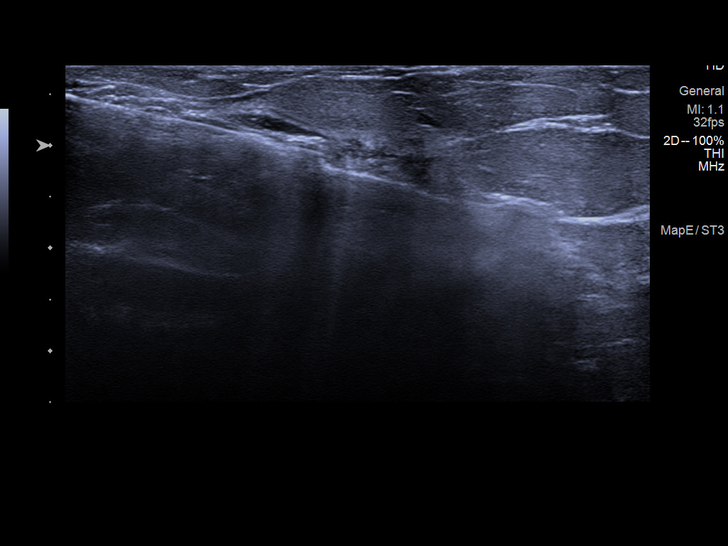
[im 3/14]
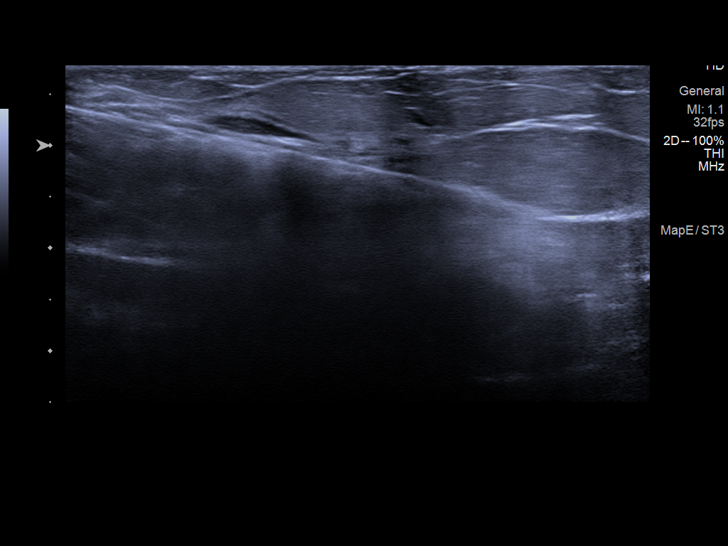
[im 5/14]
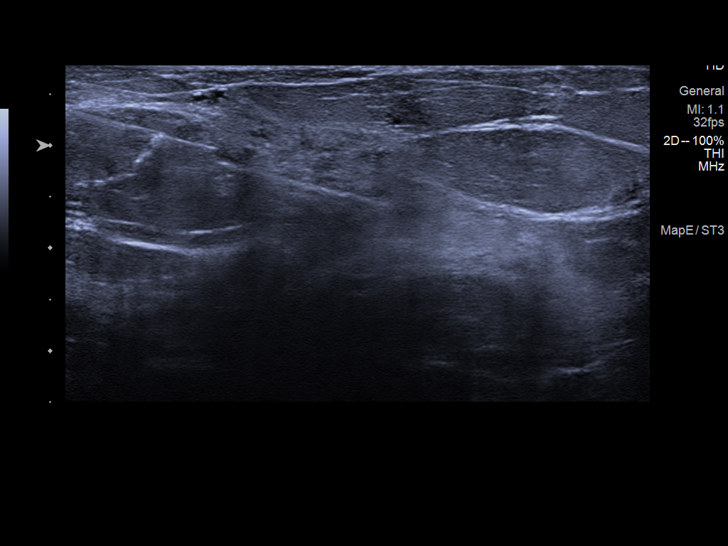
[im 6/14]
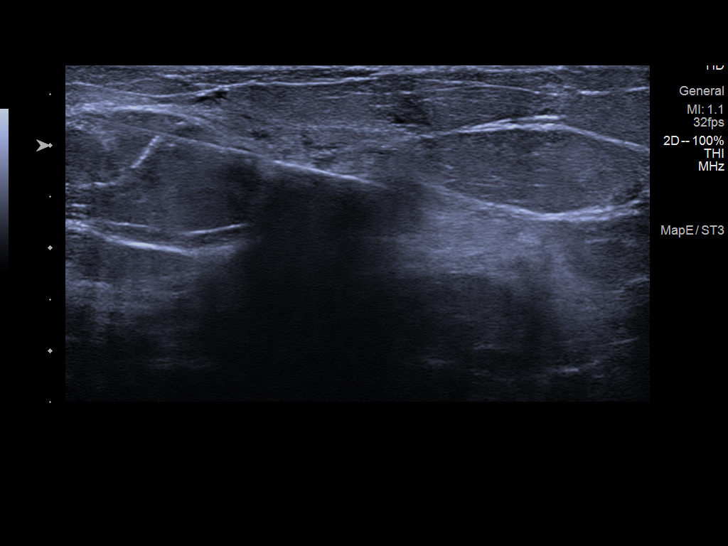
[im 7/14]
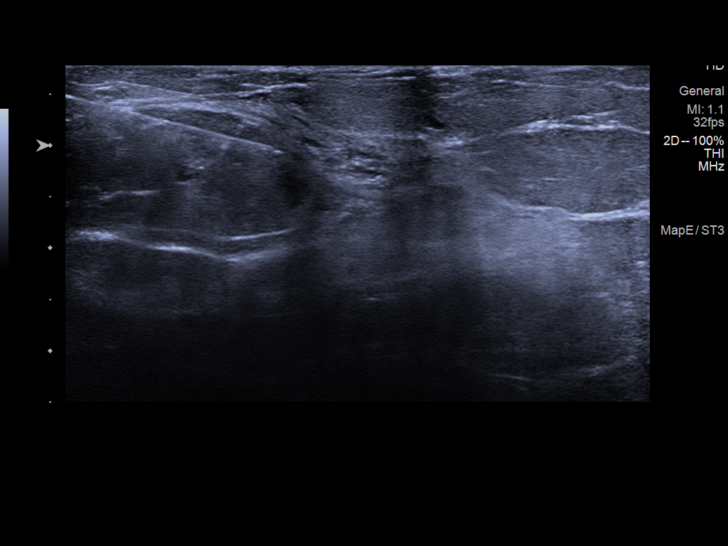
[im 8/14]
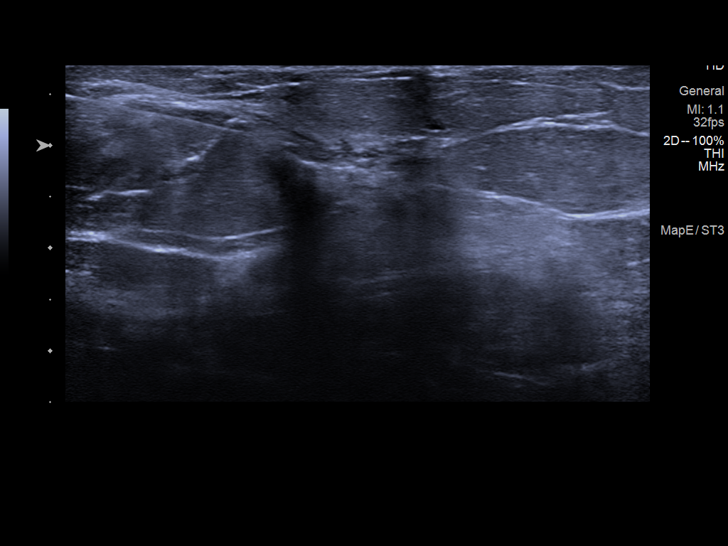
[im 9/14]
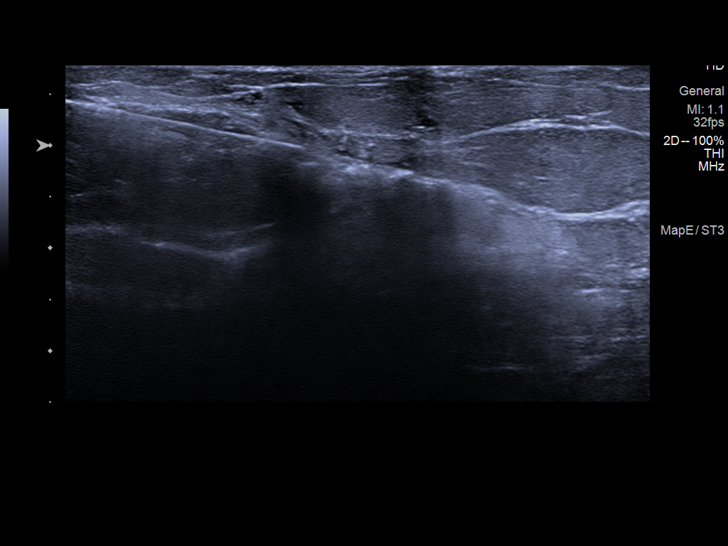
[im 10/14]
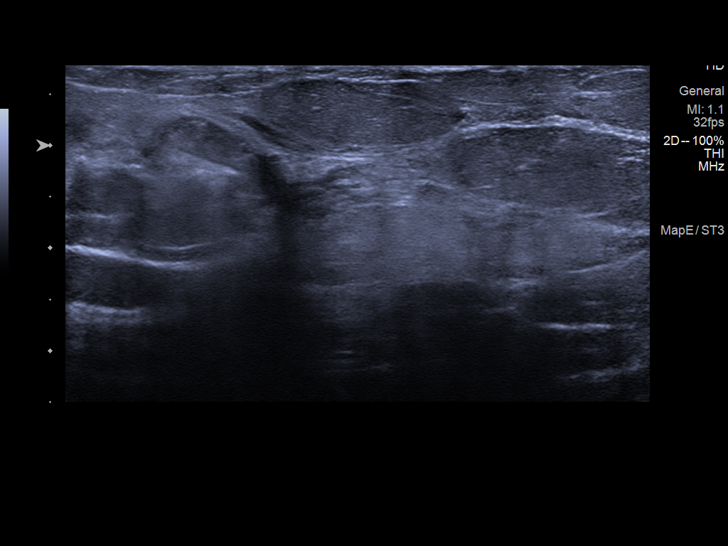
[im 12/14]
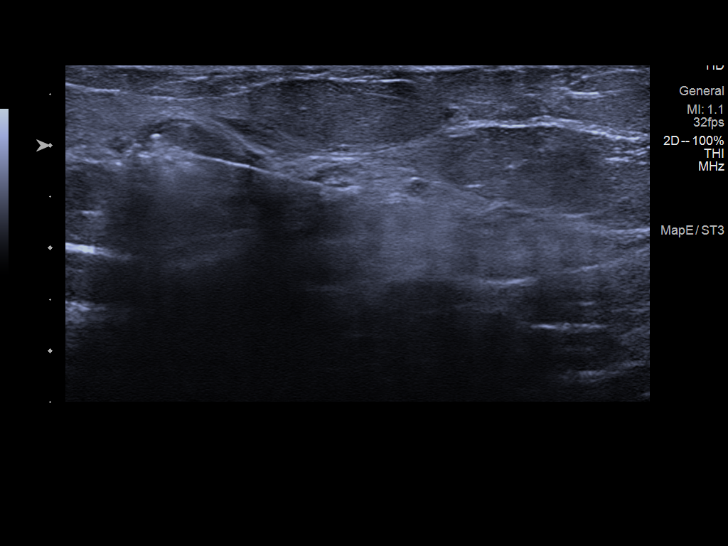
[im 13/14]
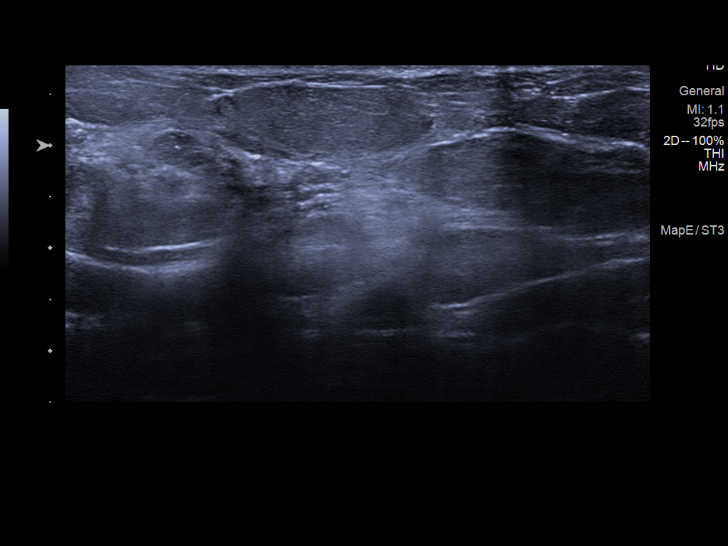
[im 14/14]
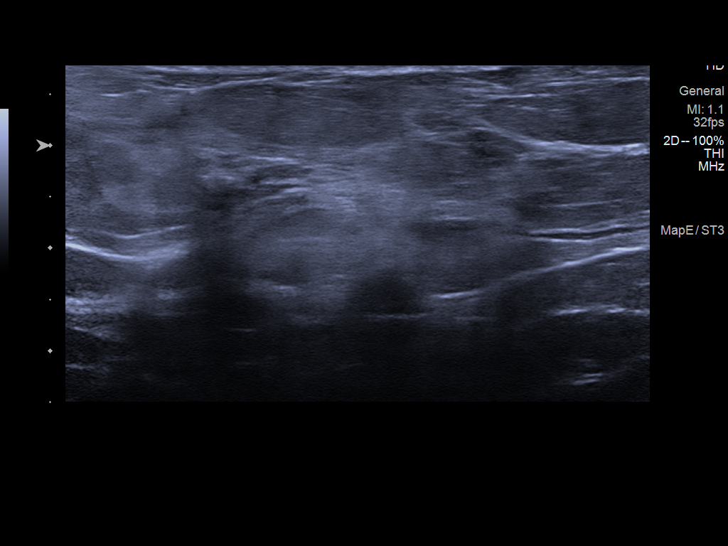

[12 of 14 positions shown; findings below may reference images not displayed]



Lesion quadrant: Upper outer quadrant

Using sterile technique and 1% Lidocaine as local anesthetic, under
direct ultrasound visualization, a 14 gauge Santamaria device was
used to perform biopsy of the they vague area shadowing between 9
and 10 o'clock, approximately 8 cm the nipple, using a lateral
approach. At the conclusion of the procedure a ribbon shaped tissue
marker clip was deployed into the biopsy cavity. Follow up 2 view
mammogram was performed and dictated separately.
IMPRESSION: Ultrasound guided biopsy of a vague area of right breast shadowing.
No apparent complications.

ADDENDUM:
PATHOLOGY revealed: A. BREAST, RIGHT, UPPER OUTER, BIOPSY: - Breast
tissue with fibrosis. No atypia or malignancy.

Pathology results are CONCORDANT with imaging findings, per Dr.
Juanis Afifi.

Pathology results and recommendations were discussed with patient
via telephone on 10/11/2020. Patient reported doing well after the
biopsy with no adverse symptoms, and only slight tenderness at the
site. Post biopsy care instructions were reviewed, questions were
answered and my direct phone number was provided. Patient was
instructed to call [HOSPITAL] [HOSPITAL] Mammography
Department for any additional questions or concerns related to
biopsy site.

Recommendation: Patient instructed to continue monthly self breast
examinations and resume annual bilateral screening mammogram due
September 2021.

Pathology results reported by Jn Bernex Deranje RN on 10/11/2020.



Lesion quadrant: Upper outer quadrant

Using sterile technique and 1% Lidocaine as local anesthetic, under
direct ultrasound visualization, a 14 gauge Santamaria device was
used to perform biopsy of the they vague area shadowing between 9
and 10 o'clock, approximately 8 cm the nipple, using a lateral
approach. At the conclusion of the procedure a ribbon shaped tissue
marker clip was deployed into the biopsy cavity. Follow up 2 view
mammogram was performed and dictated separately.
IMPRESSION: Ultrasound guided biopsy of a vague area of right breast shadowing.
No apparent complications.

## 2023-07-19 IMAGING — US US PELVIS COMPLETE WITH TRANSVAGINAL
1 series · 14 of 25 positions shown · non-contrast
Comparison: 05/12/2013

CLINICAL DATA: Bloating, post supracervical subtotal hysterectomy,
postmenopausal

EXAM:
TRANSABDOMINAL AND TRANSVAGINAL ULTRASOUND OF PELVIS
TECHNIQUE: Both transabdominal and transvaginal ultrasound examinations of the
pelvis were performed. Transabdominal technique was performed for
global imaging of the pelvis including uterus, ovaries, adnexal
regions, and pelvic cul-de-sac. It was necessary to proceed with
endovaginal exam following the transabdominal exam to visualize the
cervix and ovaries.

[Series 1: us pelvis complete with transvaginal · 0.27mm/px · 14 of 61 slices shown]
[im 1/61]
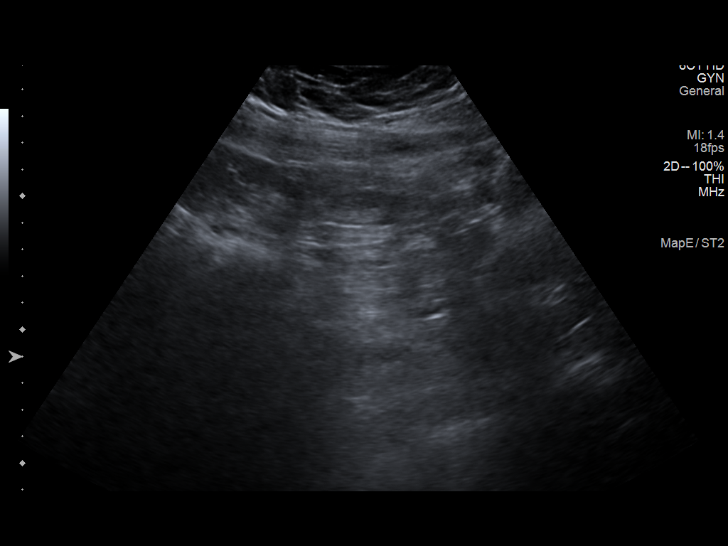
[im 6/61]
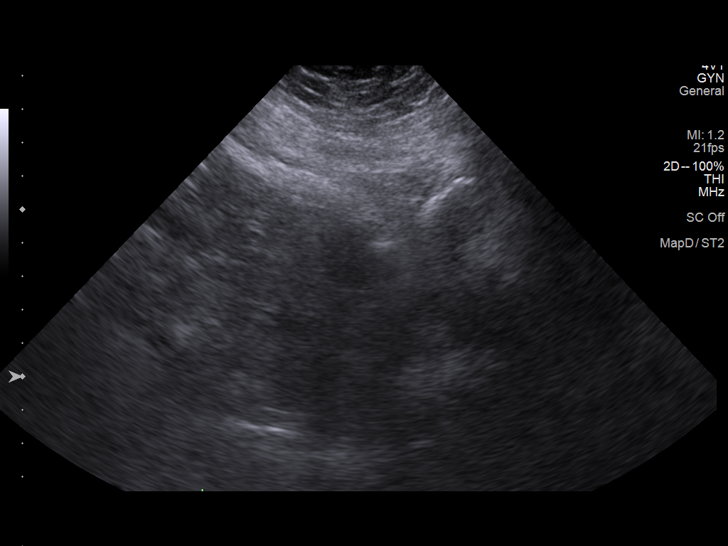
[im 11/61]
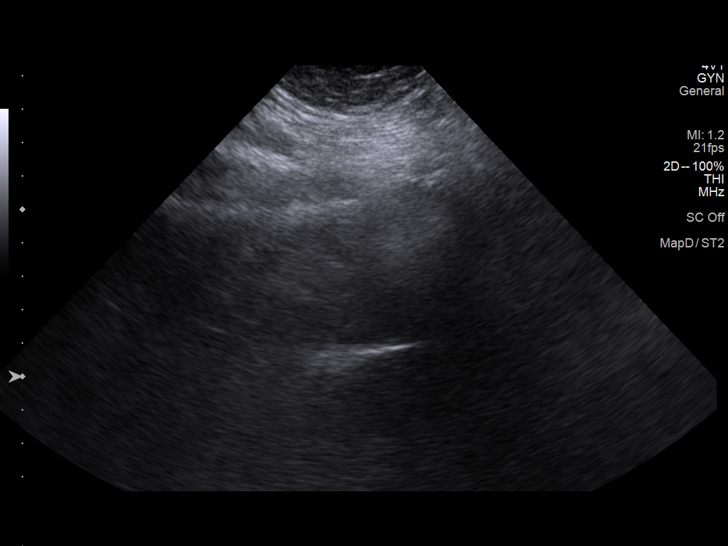
[im 16/61]
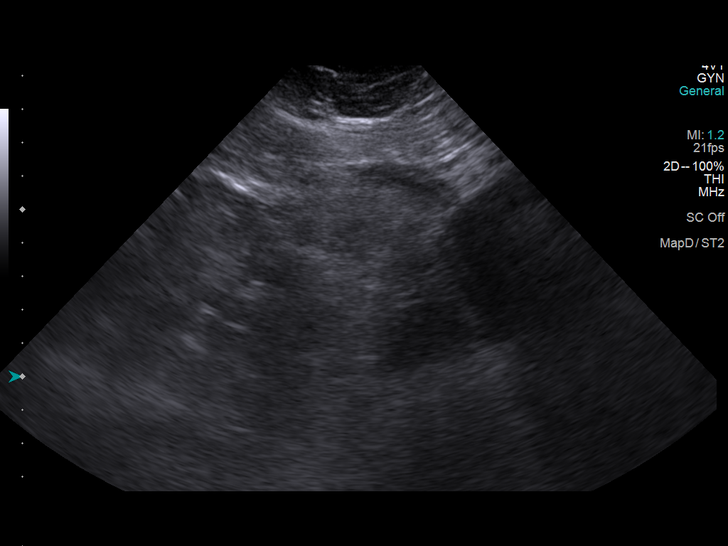
[im 21/61]
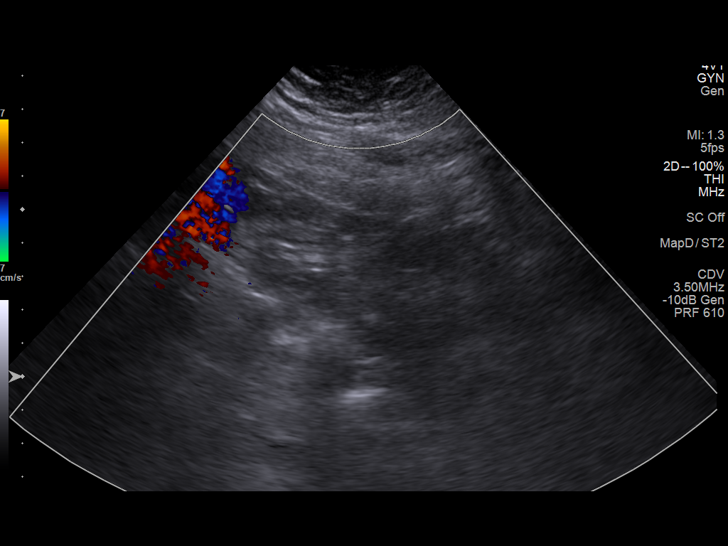
[im 23/61]
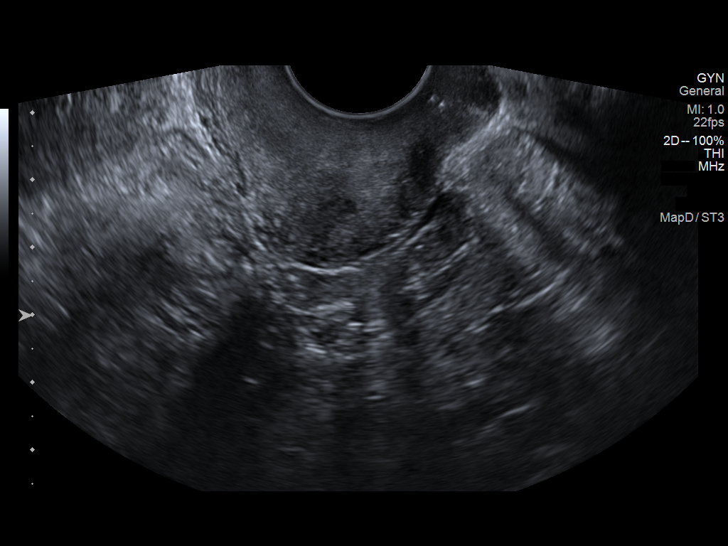
[im 28/61]
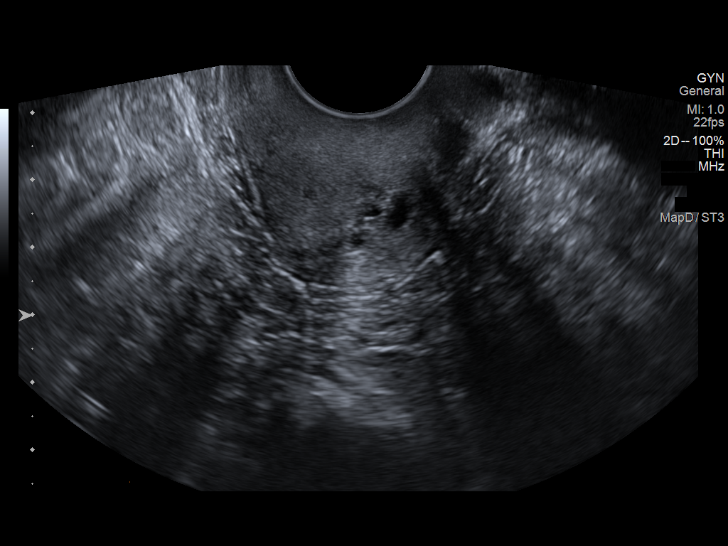
[im 33/61]
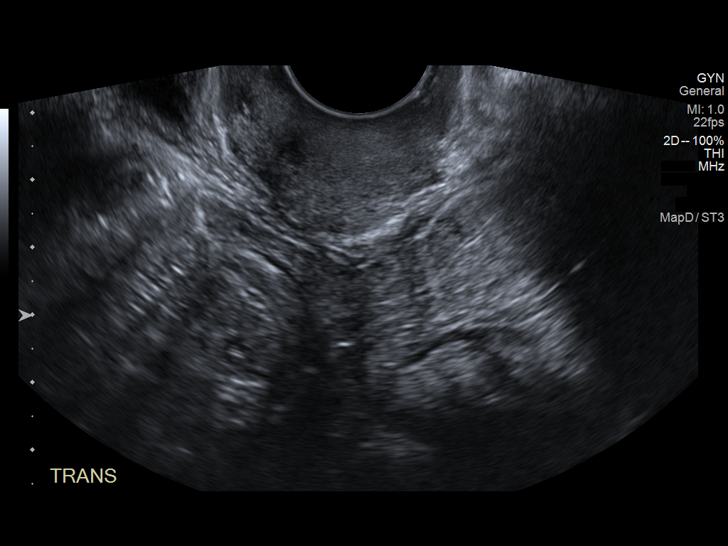
[im 38/61]
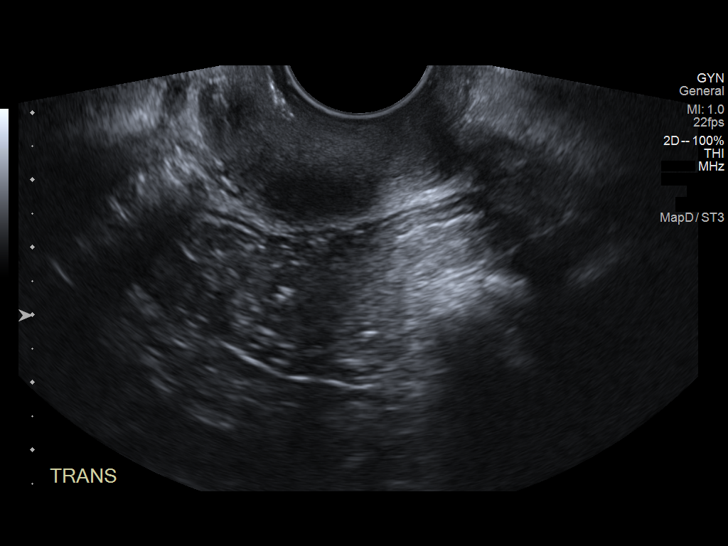
[im 41/61]
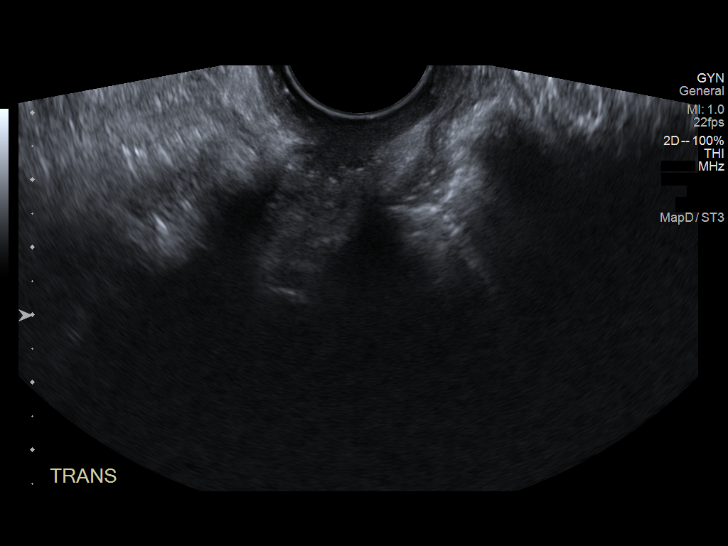
[im 46/61]
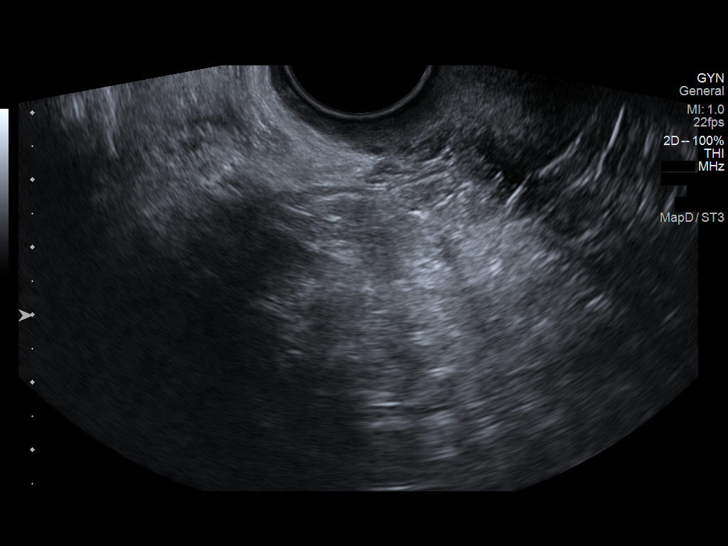
[im 51/61]
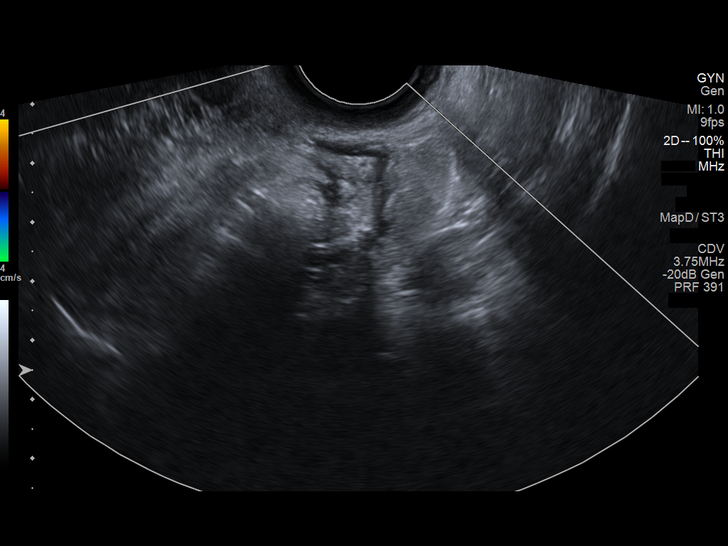
[im 56/61]
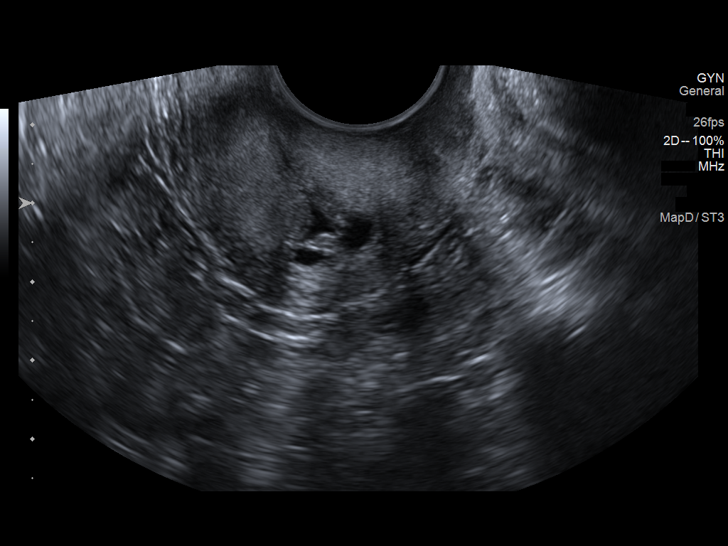
[im 61/61]
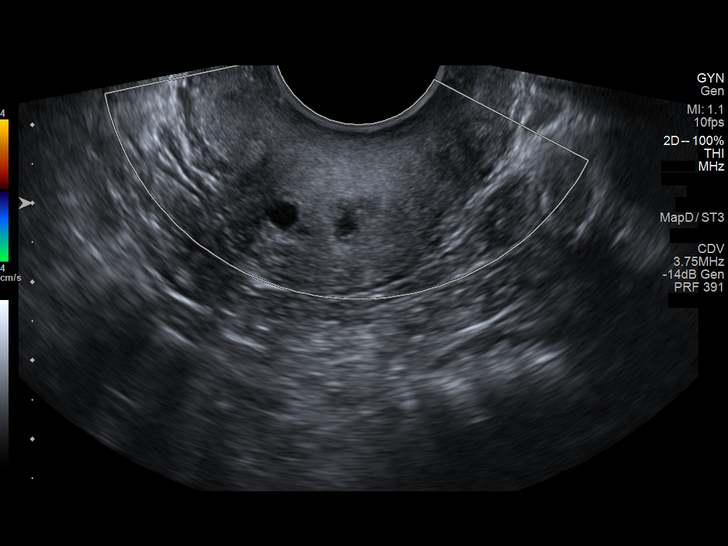

[14 of 25 positions shown; findings below may reference images not displayed]

FINDINGS: Uterus

Surgically absent

Endometrium

Surgically absent

Right ovary

Not visualized, likely obscured by bowel

Left ovary

Not visualized, likely obscured by bowel

Other findings

No free pelvic fluid. No adnexal masses. Trace endocervical fluid
noted. Small nabothian cyst in cervix.
IMPRESSION: Post hysterectomy with nonvisualization of ovaries.

No pelvic sonographic abnormalities.

## 2023-07-26 ENCOUNTER — Telehealth: Payer: Self-pay | Admitting: Pharmacy Technician

## 2023-07-26 ENCOUNTER — Other Ambulatory Visit (HOSPITAL_COMMUNITY): Payer: Self-pay

## 2023-07-26 MED ORDER — ZEPBOUND 5 MG/0.5ML ~~LOC~~ SOAJ: 5.0000 mg | SUBCUTANEOUS | 3 refills | Status: DC

## 2023-07-26 NOTE — Telephone Encounter (Signed)
 Pharmacy Patient Advocate Encounter  Received notification from [facility] Medicaid that Prior Authorization for Zepbound 5MG /0.5ML pen-injectors has been APPROVED from 07/26/2023 to 01/22/2024. Ran test claim, Copay is $4.00. This test claim was processed through [facility]- copay amounts may vary at other pharmacies due to pharmacy/plan contracts, or as the patient moves through the different stages of their insurance plan.   PA #/Case ID/Reference #: [id number]

## 2023-07-26 NOTE — Telephone Encounter (Signed)
 Pharmacy Patient Advocate Encounter   Received notification from CoverMyMeds that prior authorization for Zepbound 5MG /0.5ML pen-injectors is required/requested.   Insurance verification completed.   The patient is insured through [facility] [place] .   Per test claim: PA required; PA submitted to above mentioned insurance via CoverMyMeds Key/confirmation #/EOC [id number] Status is pending

## 2023-07-28 ENCOUNTER — Encounter: Payer: Self-pay | Admitting: Internal Medicine

## 2023-07-28 ENCOUNTER — Other Ambulatory Visit: Payer: Self-pay | Admitting: Medical Genetics

## 2023-07-30 NOTE — Telephone Encounter (Signed)
 PA request has been Approved. New Encounter has been or will be created for follow up. For additional info see Pharmacy Prior Auth telephone encounter from 07/26/2023.

## 2023-08-03 ENCOUNTER — Ambulatory Visit (INDEPENDENT_AMBULATORY_CARE_PROVIDER_SITE_OTHER)

## 2023-08-03 ENCOUNTER — Encounter: Payer: Self-pay | Admitting: Internal Medicine

## 2023-08-03 ENCOUNTER — Other Ambulatory Visit (HOSPITAL_COMMUNITY)
Admission: RE | Admit: 2023-08-03 | Discharge: 2023-08-03 | Disposition: A | Discharge status: Home / Self Care | Payer: Self-pay | Source: Ambulatory Visit | Attending: Medical Genetics | Admitting: Medical Genetics

## 2023-08-03 ENCOUNTER — Ambulatory Visit (INDEPENDENT_AMBULATORY_CARE_PROVIDER_SITE_OTHER): Payer: No Typology Code available for payment source | Admitting: Internal Medicine

## 2023-08-03 VITALS — BP 137/83 | HR 99 | Ht 69.0 in | Wt 273.5 lb

## 2023-08-03 DIAGNOSIS — F419 Anxiety disorder, unspecified: Secondary | ICD-10-CM

## 2023-08-03 DIAGNOSIS — R7303 Prediabetes: Secondary | ICD-10-CM | POA: Diagnosis not present

## 2023-08-03 DIAGNOSIS — I10 Essential (primary) hypertension: Secondary | ICD-10-CM

## 2023-08-03 DIAGNOSIS — E782 Mixed hyperlipidemia: Secondary | ICD-10-CM

## 2023-08-03 DIAGNOSIS — F32A Depression, unspecified: Secondary | ICD-10-CM | POA: Diagnosis not present

## 2023-08-03 DIAGNOSIS — J309 Allergic rhinitis, unspecified: Secondary | ICD-10-CM | POA: Diagnosis not present

## 2023-08-03 DIAGNOSIS — J4520 Mild intermittent asthma, uncomplicated: Secondary | ICD-10-CM

## 2023-08-03 MED ORDER — LORAZEPAM 0.5 MG PO TABS: 0.5000 mg | ORAL_TABLET | Freq: Three times a day (TID) | ORAL | 0 refills | Status: DC | PRN

## 2023-08-03 NOTE — Assessment & Plan Note (Signed)
 Current weight 273 pounds.  BMI 40.3.  As otherwise documented, she has recently switched from Wegovy  to Zepbound .  She will soon increase to 5 mg weekly.  She is tolerating well and has not experienced any adverse side effects.  She has focused on healthy dietary habits and is exercising regularly.  She was encouraged to maintain lifestyle modifications aimed at weight loss.  Repeat labs ordered today.  Follow-up in 3 months for weight management.

## 2023-08-03 NOTE — Progress Notes (Signed)
 Established Patient Office Visit  Subjective   Patient ID: [name], female    DOB: 09/23/1960  Age: 63 y.o. MRN: [id number]  Chief Complaint  Patient presents with   Hypertension   Cough    X 2 weeks   [name] returns to care today for routine follow-up.  She was last evaluated by me through video encounter on 4/23 for an acute visit with concern for adverse effects of Wegovy .  She reported developing a race, itching rash on her left forearm and bilateral thighs after starting Wegovy .  Ultimately, Wegovy  was discontinued and Zepbound  prescribed.  Previously seen by me for routine follow-up on 2/14.  In the interim she has been seen by allergy/immunology.  There have otherwise been no acute interval events.  [name] reports feeling fairly well today.  She endorses increased cough and wheezing over the last 2 weeks attributed to changes in weather.  Symptoms are relieved with as needed use of her rescue inhaler.  She does not have any additional concerns to discuss today.  Past Medical History:  Diagnosis Date   Anxiety    Asthma    Breast cancer (HCC) 2009   Tamoxifen, left lumpectomy   Cough    Cramps, muscle, general    Depression    Diarrhea    Hypertension    Palpitation    Personal history of radiation therapy    Visual disturbance    Wheezing    Past Surgical History:  Procedure Laterality Date   ABDOMINAL HYSTERECTOMY  09/10/2005   still have ovaries   BIOPSY N/A 09/08/2014   Procedure: BIOPSY random colon;  Surgeon: Alyce Jubilee, MD;  Location: [facility];  Service: Endoscopy;  Laterality: N/A;   BREAST BIOPSY Right 10/2020   Breast tissue with fibrosis.  No atypia or malignancy.   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   COLONOSCOPY WITH PROPOFOL  N/A 09/08/2014   Procedure: COLONOSCOPY WITH PROPOFOL ; in cecum at 0904; withdrawal time 16 minutes;  Surgeon:  Alyce Jubilee, MD;  Location: [facility];  Service: Endoscopy;  Laterality: N/A;   Social History   Tobacco Use   Smoking status: Former    Current packs/day: 0.00    Types: Cigarettes    Quit date: 03/02/1990    Years since quitting: 33.4   Smokeless tobacco: Never  Vaping Use   Vaping status: Never Used  Substance Use Topics   Alcohol use: No    Alcohol/week: 0.0 standard drinks of alcohol   Drug use: No   Family History  Problem Relation Age of Onset   Diabetes Father    Diabetes Mother    Stroke Mother    Hypertension Mother    Multiple myeloma Mother    Macular degeneration Mother    Congestive Heart Failure Mother    Hypertension Brother    Hypertension Sister    Cancer Paternal Aunt        ovarian   Cancer Cousin        colon   Colon cancer Other        no first degree relatives   Allergies  Allergen Reactions   Ceclor [Cefaclor] Other (See Comments)    Causes pain in esophagus   Erythromycin Hives and Itching   Green Tea (Camellia Sinensis) Other (See Comments)    Eyes swell   Latex     Welts  Other Itching    pineapple   Penicillins Hives and Itching   Tetracycline Hives and Itching   Passion Fruit Flavoring Agent (Non-Screening)    Amoxicillin Hives   Pineapple Itching   Tetracyclines & Related Nausea Only   Review of Systems  Constitutional:  Negative for chills and fever.  HENT:  Negative for sore throat.   Respiratory:  Positive for cough and wheezing. Negative for shortness of breath.   Cardiovascular:  Negative for chest pain, palpitations and leg swelling.  Gastrointestinal:  Negative for abdominal pain, blood in stool, constipation, diarrhea, nausea and vomiting.  Genitourinary:  Negative for dysuria and hematuria.  Musculoskeletal:  Negative for myalgias.  Skin:  Negative for itching and rash.  Neurological:  Negative for dizziness and headaches.  Psychiatric/Behavioral:  Negative for depression and suicidal ideas.      Objective:      BP 137/83   Pulse 99   Ht 5\' 9"  (1.753 m)   Wt 273 lb 8 oz (124.1 kg)   SpO2 94%   BMI 40.39 kg/m  BP Readings from Last 3 Encounters:  08/03/23 137/83  05/18/23 (!) 150/100  04/27/23 134/78   Physical Exam Vitals reviewed.  Constitutional:      General: She is not in acute distress.    Appearance: Normal appearance. She is obese. She is not toxic-appearing.  HENT:     Head: Normocephalic and atraumatic.     Right Ear: External ear normal.     Left Ear: External ear normal.     Nose: Nose normal. No congestion or rhinorrhea.     Mouth/Throat:     Mouth: Mucous membranes are moist.     Pharynx: Oropharynx is clear. No oropharyngeal exudate or posterior oropharyngeal erythema.  Eyes:     General: No scleral icterus.    Extraocular Movements: Extraocular movements intact.     Conjunctiva/sclera: Conjunctivae normal.     Pupils: Pupils are equal, round, and reactive to light.  Cardiovascular:     Rate and Rhythm: Normal rate and regular rhythm.     Pulses: Normal pulses.     Heart sounds: Normal heart sounds. No murmur heard.    No friction rub. No gallop.  Pulmonary:     Effort: Pulmonary effort is normal.     Breath sounds: Normal breath sounds. No wheezing, rhonchi or rales.  Abdominal:     General: Abdomen is flat. Bowel sounds are normal. There is no distension.     Palpations: Abdomen is soft.     Tenderness: There is no abdominal tenderness.  Musculoskeletal:        General: No swelling. Normal range of motion.     Cervical back: Normal range of motion.     Right lower leg: No edema.     Left lower leg: No edema.  Lymphadenopathy:     Cervical: No cervical adenopathy.  Skin:    General: Skin is warm and dry.     Capillary Refill: Capillary refill takes less than 2 seconds.     Coloration: Skin is not jaundiced.  Neurological:     General: No focal deficit present.     Mental Status: She is alert and oriented to person, place, and time.  Psychiatric:         Mood and Affect: Mood normal.        Behavior: Behavior normal.   Last CBC Lab Results  Component Value Date   WBC 4.2 08/28/2022   HGB 13.5 08/28/2022  HCT 40.2 08/28/2022   MCV 86 08/28/2022   MCH 28.8 08/28/2022   RDW 12.7 08/28/2022   PLT 223 08/28/2022   Last metabolic panel Lab Results  Component Value Date   GLUCOSE 94 08/28/2022   NA 142 08/28/2022   K 3.7 08/28/2022   CL 99 08/28/2022   CO2 28 08/28/2022   BUN 11 08/28/2022   CREATININE 0.73 08/28/2022   EGFR 95 08/28/2022   CALCIUM 9.4 08/28/2022   PROT 6.9 08/28/2022   ALBUMIN 4.1 08/28/2022   LABGLOB 2.8 08/28/2022   BILITOT 0.3 08/28/2022   ALKPHOS 100 08/28/2022   AST 23 08/28/2022   ALT 17 08/28/2022   ANIONGAP 11 09/10/2019   Last lipids Lab Results  Component Value Date   CHOL 227 (H) 08/28/2022   HDL 69 08/28/2022   LDLCALC 146 (H) 08/28/2022   TRIG 67 08/28/2022   CHOLHDL 3.3 08/28/2022   Last hemoglobin A1c Lab Results  Component Value Date   HGBA1C 6.0 (H) 08/28/2022   Last thyroid functions Lab Results  Component Value Date   TSH 0.666 08/28/2022   Last vitamin D  Lab Results  Component Value Date   VD25OH 48.0 08/28/2022   Last vitamin B12 and Folate Lab Results  Component Value Date   VITAMINB12 669 08/28/2022   FOLATE 19.6 08/28/2022   The 10-year ASCVD risk score (Arnett DK, et al., 2019) is: 8.1%    Assessment & Plan:   Problem List Items Addressed This Visit       Essential hypertension   Remains adequately controlled on current antihypertensive regimen.  No medication changes are indicated today.      Asthma   Pulmonary exam is unremarkable.  She endorses intermittent cough and wheezing over the last 2 weeks attributed to changes in weather.  Followed by allergy/immunology and is prescribed Airsupra  for rescue use, which alleviates her symptoms.      Anxiety and depression - Primary   Mood remains stable and anxiety adequately controlled with Cymbalta   and lorazepam .  PDMP reviewed and is appropriate.  Lorazepam  refilled today.      Morbid obesity (HCC)   Current weight 273 pounds.  BMI 40.3.  As otherwise documented, she has recently switched from Wegovy  to Zepbound .  She will soon increase to 5 mg weekly.  She is tolerating well and has not experienced any adverse side effects.  She has focused on healthy dietary habits and is exercising regularly.  She was encouraged to maintain lifestyle modifications aimed at weight loss.  Repeat labs ordered today.  Follow-up in 3 months for weight management.      Return in about 3 months (around 11/03/2023).   Tobi Fortes, MD

## 2023-08-03 NOTE — Assessment & Plan Note (Signed)
 Mood remains stable and anxiety adequately controlled with Cymbalta  and lorazepam .  PDMP reviewed and is appropriate.  Lorazepam  refilled today.

## 2023-08-03 NOTE — Assessment & Plan Note (Signed)
 Remains adequately controlled on current antihypertensive regimen.  No medication changes are indicated today.

## 2023-08-03 NOTE — Assessment & Plan Note (Signed)
 Pulmonary exam is unremarkable.  She endorses intermittent cough and wheezing over the last 2 weeks attributed to changes in weather.  Followed by allergy/immunology and is prescribed Airsupra  for rescue use, which alleviates her symptoms.

## 2023-08-03 NOTE — Patient Instructions (Signed)
 It was a pleasure to see you today.  Thank you for giving us  the opportunity to be involved in your care.  Below is a brief recap of your visit and next steps.  We will plan to see you again in 3 months.  Summary No medication changes today Lorazepam  refilled Follow up in 3 months

## 2023-08-04 LAB — CMP14+EGFR
ALT: 14 IU/L (ref 0–32)
AST: 15 IU/L (ref 0–40)
Albumin: 4.3 g/dL (ref 3.8–4.9)
Alkaline Phosphatase: 103 IU/L (ref 44–121)
BUN/Creatinine Ratio: 17 (ref 12–28)
BUN: 13 mg/dL (ref 8–27)
Bilirubin Total: 0.4 mg/dL (ref 0.0–1.2)
CO2: 27 mmol/L (ref 20–29)
Calcium: 9.4 mg/dL (ref 8.7–10.3)
Chloride: 99 mmol/L (ref 96–106)
Creatinine, Ser: 0.76 mg/dL (ref 0.57–1.00)
Globulin, Total: 2.6 g/dL (ref 1.5–4.5)
Glucose: 94 mg/dL (ref 70–99)
Potassium: 4.2 mmol/L (ref 3.5–5.2)
Sodium: 141 mmol/L (ref 134–144)
Total Protein: 6.9 g/dL (ref 6.0–8.5)
eGFR: 90 mL/min/{1.73_m2} (ref >59)

## 2023-08-04 LAB — CBC WITH DIFFERENTIAL/PLATELET
Basophils Absolute: 0.1 10*3/uL (ref 0.0–0.2)
Basos: 1 %
EOS (ABSOLUTE): 0.3 10*3/uL (ref 0.0–0.4)
Eos: 5 %
Hematocrit: 42 % (ref 34.0–46.6)
Hemoglobin: 13.9 g/dL (ref 11.1–15.9)
Immature Grans (Abs): 0 10*3/uL (ref 0.0–0.1)
Immature Granulocytes: 0 %
Lymphocytes Absolute: 2 10*3/uL (ref 0.7–3.1)
Lymphs: 31 %
MCH: 28.7 pg (ref 26.6–33.0)
MCHC: 33.1 g/dL (ref 31.5–35.7)
MCV: 87 fL (ref 79–97)
Monocytes Absolute: 0.6 10*3/uL (ref 0.1–0.9)
Monocytes: 9 %
Neutrophils Absolute: 3.5 10*3/uL (ref 1.4–7.0)
Neutrophils: 54 %
Platelets: 242 10*3/uL (ref 150–450)
RBC: 4.85 x10E6/uL (ref 3.77–5.28)
RDW: 13.4 % (ref 11.7–15.4)
WBC: 6.5 10*3/uL (ref 3.4–10.8)

## 2023-08-04 LAB — TSH+FREE T4
Free T4: 1.35 ng/dL (ref 0.82–1.77)
TSH: 1.15 u[IU]/mL (ref 0.450–4.500)

## 2023-08-04 LAB — VITAMIN D 25 HYDROXY (VIT D DEFICIENCY, FRACTURES): Vit D, 25-Hydroxy: 43.3 ng/mL (ref 30.0–100.0)

## 2023-08-04 LAB — LIPID PANEL
Chol/HDL Ratio: 3.1 ratio (ref 0.0–4.4)
Cholesterol, Total: 185 mg/dL (ref 100–199)
HDL: 60 mg/dL (ref >39)
LDL Chol Calc (NIH): 113 mg/dL — ABNORMAL HIGH (ref 0–99)
Triglycerides: 62 mg/dL (ref 0–149)
VLDL Cholesterol Cal: 12 mg/dL (ref 5–40)

## 2023-08-04 LAB — B12 AND FOLATE PANEL
Folate: >20 ng/mL (ref >3.0)
Vitamin B-12: 955 pg/mL (ref 232–1245)

## 2023-08-04 LAB — HEMOGLOBIN A1C
Est. average glucose Bld gHb Est-mCnc: 120 mg/dL
Hgb A1c MFr Bld: 5.8 % — ABNORMAL HIGH (ref 4.8–5.6)

## 2023-08-06 ENCOUNTER — Ambulatory Visit: Payer: Self-pay | Admitting: Internal Medicine

## 2023-08-06 DIAGNOSIS — R7303 Prediabetes: Secondary | ICD-10-CM

## 2023-08-08 ENCOUNTER — Ambulatory Visit (INDEPENDENT_AMBULATORY_CARE_PROVIDER_SITE_OTHER): Payer: Self-pay

## 2023-08-08 DIAGNOSIS — J309 Allergic rhinitis, unspecified: Secondary | ICD-10-CM | POA: Diagnosis not present

## 2023-08-12 LAB — GENECONNECT MOLECULAR SCREEN: Genetic Analysis Overall Interpretation: NEGATIVE

## 2023-08-13 ENCOUNTER — Other Ambulatory Visit (HOSPITAL_COMMUNITY): Payer: Self-pay

## 2023-08-13 MED ORDER — ZEPBOUND 7.5 MG/0.5ML ~~LOC~~ SOAJ: 7.5000 mg | SUBCUTANEOUS | 0 refills | Status: DC

## 2023-08-16 ENCOUNTER — Ambulatory Visit: Payer: Self-pay | Admitting: Medical Genetics

## 2023-08-17 ENCOUNTER — Other Ambulatory Visit (HOSPITAL_COMMUNITY): Payer: Self-pay

## 2023-08-27 ENCOUNTER — Encounter: Payer: Self-pay | Admitting: Internal Medicine

## 2023-08-29 ENCOUNTER — Ambulatory Visit (INDEPENDENT_AMBULATORY_CARE_PROVIDER_SITE_OTHER): Payer: Self-pay

## 2023-08-29 DIAGNOSIS — J309 Allergic rhinitis, unspecified: Secondary | ICD-10-CM

## 2023-09-05 ENCOUNTER — Ambulatory Visit (INDEPENDENT_AMBULATORY_CARE_PROVIDER_SITE_OTHER): Payer: Self-pay

## 2023-09-05 DIAGNOSIS — J309 Allergic rhinitis, unspecified: Secondary | ICD-10-CM | POA: Diagnosis not present

## 2023-09-06 ENCOUNTER — Other Ambulatory Visit: Payer: Self-pay | Admitting: Internal Medicine

## 2023-09-06 DIAGNOSIS — F32A Depression, unspecified: Secondary | ICD-10-CM

## 2023-09-08 ENCOUNTER — Other Ambulatory Visit: Payer: Self-pay | Admitting: Internal Medicine

## 2023-09-08 DIAGNOSIS — R7303 Prediabetes: Secondary | ICD-10-CM

## 2023-09-10 ENCOUNTER — Other Ambulatory Visit: Payer: Self-pay

## 2023-09-10 DIAGNOSIS — Z1231 Encounter for screening mammogram for malignant neoplasm of breast: Secondary | ICD-10-CM

## 2023-09-12 ENCOUNTER — Ambulatory Visit (INDEPENDENT_AMBULATORY_CARE_PROVIDER_SITE_OTHER): Payer: Self-pay

## 2023-09-12 ENCOUNTER — Encounter: Payer: Self-pay | Admitting: Internal Medicine

## 2023-09-12 DIAGNOSIS — J309 Allergic rhinitis, unspecified: Secondary | ICD-10-CM | POA: Diagnosis not present

## 2023-09-13 ENCOUNTER — Other Ambulatory Visit (HOSPITAL_COMMUNITY): Payer: Self-pay

## 2023-09-13 NOTE — Telephone Encounter (Signed)
 Pt has an approved PA that is effective until 01/26/2024. I think she is wanting to move up to the 10mg  vs the 7.5mg ?

## 2023-09-15 ENCOUNTER — Encounter: Payer: Self-pay | Admitting: Allergy & Immunology

## 2023-09-20 ENCOUNTER — Other Ambulatory Visit: Payer: Self-pay | Admitting: Internal Medicine

## 2023-09-20 ENCOUNTER — Ambulatory Visit: Admitting: Nurse Practitioner

## 2023-09-24 ENCOUNTER — Ambulatory Visit (INDEPENDENT_AMBULATORY_CARE_PROVIDER_SITE_OTHER): Admitting: Nurse Practitioner

## 2023-09-24 ENCOUNTER — Encounter: Payer: Self-pay | Admitting: Nurse Practitioner

## 2023-09-24 VITALS — BP 120/85 | HR 104 | Ht 69.0 in | Wt 260.2 lb

## 2023-09-24 DIAGNOSIS — F419 Anxiety disorder, unspecified: Secondary | ICD-10-CM

## 2023-09-24 DIAGNOSIS — R609 Edema, unspecified: Secondary | ICD-10-CM

## 2023-09-24 DIAGNOSIS — E782 Mixed hyperlipidemia: Secondary | ICD-10-CM

## 2023-09-24 DIAGNOSIS — R7303 Prediabetes: Secondary | ICD-10-CM | POA: Diagnosis not present

## 2023-09-24 DIAGNOSIS — I10 Essential (primary) hypertension: Secondary | ICD-10-CM

## 2023-09-24 DIAGNOSIS — F32A Depression, unspecified: Secondary | ICD-10-CM

## 2023-09-24 MED ORDER — ZEPBOUND 7.5 MG/0.5ML ~~LOC~~ SOAJ: 10.0000 mg | SUBCUTANEOUS | 6 refills | Status: DC

## 2023-09-24 MED ORDER — AIRSUPRA 90-80 MCG/ACT IN AERO: 2.0000 | INHALATION_SPRAY | RESPIRATORY_TRACT | Status: AC | PRN

## 2023-09-24 MED ORDER — ALBUTEROL SULFATE (2.5 MG/3ML) 0.083% IN NEBU: 2.5000 mg | INHALATION_SOLUTION | Freq: Four times a day (QID) | RESPIRATORY_TRACT | 1 refills | Status: AC | PRN

## 2023-09-24 MED ORDER — AIRSUPRA 90-80 MCG/ACT IN AERO: 2.0000 | INHALATION_SPRAY | RESPIRATORY_TRACT | 5 refills | Status: AC | PRN

## 2023-09-24 MED ORDER — EPINEPHRINE 0.3 MG/0.3ML IJ SOAJ: 0.3000 mg | INTRAMUSCULAR | 1 refills | Status: AC | PRN

## 2023-09-24 MED ORDER — DULOXETINE HCL 60 MG PO CPEP: 60.0000 mg | ORAL_CAPSULE | Freq: Every day | ORAL | 0 refills | Status: DC

## 2023-09-24 MED ORDER — HYDROCHLOROTHIAZIDE 50 MG PO TABS: 50.0000 mg | ORAL_TABLET | Freq: Every day | ORAL | 0 refills | Status: DC

## 2023-09-24 MED ORDER — OLOPATADINE HCL 0.2 % OP SOLN: 1.0000 [drp] | OPHTHALMIC | 5 refills | Status: DC

## 2023-09-24 MED ORDER — ALBUTEROL SULFATE HFA 108 (90 BASE) MCG/ACT IN AERS: 2.0000 | INHALATION_SPRAY | Freq: Four times a day (QID) | RESPIRATORY_TRACT | 1 refills | Status: AC | PRN

## 2023-09-24 MED ORDER — ASPIRIN EC 81 MG PO TBEC: 81.0000 mg | DELAYED_RELEASE_TABLET | Freq: Every day | ORAL | 3 refills | Status: AC

## 2023-09-24 MED ORDER — LORAZEPAM 0.5 MG PO TABS: 0.5000 mg | ORAL_TABLET | Freq: Three times a day (TID) | ORAL | 0 refills | Status: DC | PRN

## 2023-09-24 MED ORDER — CLOBETASOL PROPIONATE 0.05 % EX OINT: TOPICAL_OINTMENT | Freq: Two times a day (BID) | CUTANEOUS | 3 refills | Status: DC
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
## 2023-09-24 NOTE — Patient Instructions (Signed)
 1) Refills 2) Fasting labs today 3) Wants zepbound  increased to 10 mg 4) Follow up appt in 4 months

## 2023-09-25 ENCOUNTER — Other Ambulatory Visit: Payer: Self-pay | Admitting: Nurse Practitioner

## 2023-09-26 ENCOUNTER — Other Ambulatory Visit: Payer: Self-pay

## 2023-09-26 ENCOUNTER — Encounter: Payer: Self-pay | Admitting: Nurse Practitioner

## 2023-09-26 ENCOUNTER — Ambulatory Visit: Payer: Self-pay

## 2023-09-26 MED ORDER — ROSUVASTATIN CALCIUM 20 MG PO TABS: 20.0000 mg | ORAL_TABLET | Freq: Every day | ORAL | 0 refills | Status: DC

## 2023-09-28 LAB — CMP14+EGFR
ALT: 20 IU/L (ref 0–32)
AST: 20 IU/L (ref 0–40)
Albumin: 4.4 g/dL (ref 3.8–4.9)
Alkaline Phosphatase: 117 IU/L (ref 44–121)
BUN/Creatinine Ratio: 27 (ref 12–28)
BUN: 20 mg/dL (ref 8–27)
Bilirubin Total: 0.4 mg/dL (ref 0.0–1.2)
CO2: 24 mmol/L (ref 20–29)
Calcium: 9.7 mg/dL (ref 8.7–10.3)
Chloride: 99 mmol/L (ref 96–106)
Creatinine, Ser: 0.75 mg/dL (ref 0.57–1.00)
Globulin, Total: 2.9 g/dL (ref 1.5–4.5)
Glucose: 95 mg/dL (ref 70–99)
Potassium: 3.3 mmol/L — ABNORMAL LOW (ref 3.5–5.2)
Sodium: 144 mmol/L (ref 134–144)
Total Protein: 7.3 g/dL (ref 6.0–8.5)
eGFR: 91 mL/min/1.73 (ref >59)

## 2023-09-28 LAB — LIPID PANEL
Chol/HDL Ratio: 3.7 ratio (ref 0.0–4.4)
Cholesterol, Total: 219 mg/dL — ABNORMAL HIGH (ref 100–199)
HDL: 59 mg/dL (ref >39)
LDL Chol Calc (NIH): 148 mg/dL — ABNORMAL HIGH (ref 0–99)
Triglycerides: 67 mg/dL (ref 0–149)
VLDL Cholesterol Cal: 12 mg/dL (ref 5–40)

## 2023-09-28 LAB — TSH: TSH: 0.992 u[IU]/mL (ref 0.450–4.500)

## 2023-10-03 ENCOUNTER — Other Ambulatory Visit (HOSPITAL_COMMUNITY): Payer: Self-pay

## 2023-10-04 NOTE — Telephone Encounter (Signed)
 Current PA does not expire until 01/22/2024. I think she is requesting for the next dose of 10mg  to be sent to her pharmacy.

## 2023-10-07 ENCOUNTER — Encounter: Payer: Self-pay | Admitting: Nurse Practitioner

## 2023-10-08 ENCOUNTER — Other Ambulatory Visit: Payer: Self-pay

## 2023-10-08 MED ORDER — TIRZEPATIDE-WEIGHT MANAGEMENT 10 MG/0.5ML ~~LOC~~ SOLN: 10.0000 mg | SUBCUTANEOUS | 3 refills | Status: DC

## 2023-10-09 ENCOUNTER — Other Ambulatory Visit (HOSPITAL_COMMUNITY): Payer: Self-pay

## 2023-10-10 ENCOUNTER — Ambulatory Visit (INDEPENDENT_AMBULATORY_CARE_PROVIDER_SITE_OTHER)

## 2023-10-10 DIAGNOSIS — J309 Allergic rhinitis, unspecified: Secondary | ICD-10-CM | POA: Diagnosis not present

## 2023-10-15 ENCOUNTER — Ambulatory Visit
Admission: RE | Admit: 2023-10-15 | Discharge: 2023-10-15 | Disposition: A | Discharge status: Home / Self Care | Source: Ambulatory Visit

## 2023-10-15 DIAGNOSIS — Z1231 Encounter for screening mammogram for malignant neoplasm of breast: Secondary | ICD-10-CM

## 2023-10-16 ENCOUNTER — Telehealth: Payer: Self-pay | Admitting: Pharmacy Technician

## 2023-10-16 ENCOUNTER — Other Ambulatory Visit (HOSPITAL_COMMUNITY): Payer: Self-pay

## 2023-10-16 NOTE — Telephone Encounter (Signed)
 PA request has been Received. New Encounter has been or will be created for follow up. For additional info see Pharmacy Prior Auth telephone encounter from 10/16/2023.  Her current PA does not expire until 01/22/2024. It is too soon to be refilled. Insurance will not pay until on or after 10/18/2023. Patient pharmacy is aware of the refill date.

## 2023-10-16 NOTE — Telephone Encounter (Signed)
 Pharmacy Patient Advocate Encounter   Received notification from Patient Advice Request messages that prior authorization for Zepbound  10mg /0.32ml auto-injectors is required/requested.   Insurance verification completed.   The patient is [place] through [facility] [place] .   Per test claim: Refill too soon. PA is not needed at this time. Medication was filled 09/26/2023. Next eligible fill date is 10/18/2023.

## 2023-10-18 ENCOUNTER — Other Ambulatory Visit: Payer: Self-pay

## 2023-10-18 DIAGNOSIS — R928 Other abnormal and inconclusive findings on diagnostic imaging of breast: Secondary | ICD-10-CM

## 2023-10-18 DIAGNOSIS — F32A Depression, unspecified: Secondary | ICD-10-CM

## 2023-10-24 ENCOUNTER — Other Ambulatory Visit: Payer: Self-pay

## 2023-10-24 ENCOUNTER — Ambulatory Visit
Admission: RE | Admit: 2023-10-24 | Discharge: 2023-10-24 | Disposition: A | Discharge status: Home / Self Care | Source: Ambulatory Visit

## 2023-10-24 DIAGNOSIS — R928 Other abnormal and inconclusive findings on diagnostic imaging of breast: Secondary | ICD-10-CM

## 2023-10-24 DIAGNOSIS — R599 Enlarged lymph nodes, unspecified: Secondary | ICD-10-CM

## 2023-10-24 DIAGNOSIS — N6320 Unspecified lump in the left breast, unspecified quadrant: Secondary | ICD-10-CM

## 2023-10-26 ENCOUNTER — Ambulatory Visit
Admission: RE | Admit: 2023-10-26 | Discharge: 2023-10-26 | Disposition: A | Discharge status: Home / Self Care | Source: Ambulatory Visit

## 2023-10-26 DIAGNOSIS — R928 Other abnormal and inconclusive findings on diagnostic imaging of breast: Secondary | ICD-10-CM

## 2023-10-26 DIAGNOSIS — N6320 Unspecified lump in the left breast, unspecified quadrant: Secondary | ICD-10-CM

## 2023-10-26 DIAGNOSIS — R599 Enlarged lymph nodes, unspecified: Secondary | ICD-10-CM

## 2023-10-26 HISTORY — PX: BREAST BIOPSY: SHX20

## 2023-10-28 ENCOUNTER — Other Ambulatory Visit: Payer: Self-pay | Admitting: Allergy & Immunology

## 2023-10-29 LAB — SURGICAL PATHOLOGY

## 2023-10-30 ENCOUNTER — Telehealth: Payer: Self-pay | Admitting: *Deleted

## 2023-10-30 ENCOUNTER — Encounter: Payer: Self-pay | Admitting: *Deleted

## 2023-10-30 NOTE — Telephone Encounter (Signed)
 Patient returned call regarding information for [name] on 8/27. She has had breast cancer in the past and had seen Dr. Odean however she would like to come to [name] next week and see the providers scheduled for clinic that day. Patient is aware to arrive at 0830 at [name]. She wants paperwork mailed to her. Questions answered and she is aware to call back for any needs.

## 2023-10-30 NOTE — Telephone Encounter (Signed)
 Left message requesting return phone call regarding [facility] clinic for next week.

## 2023-11-02 ENCOUNTER — Ambulatory Visit

## 2023-11-02 ENCOUNTER — Encounter: Payer: Self-pay | Admitting: Allergy & Immunology

## 2023-11-04 ENCOUNTER — Ambulatory Visit: Payer: Self-pay

## 2023-11-05 ENCOUNTER — Encounter: Payer: Self-pay | Admitting: *Deleted

## 2023-11-05 DIAGNOSIS — Z170 Estrogen receptor positive status [ER+]: Secondary | ICD-10-CM | POA: Insufficient documentation

## 2023-11-06 NOTE — Progress Notes (Incomplete)
 Radiation Oncology         ([phone]) [phone] ________________________________  Multidisciplinary Breast Oncology Clinic [facility]) Initial Outpatient Consultation  Name: [name] MRN: [id number]  Date: 11/07/2023  DOB: March 20, 1960  CC:Glennon Sand, NP  Vanderbilt Ned, MD   REFERRING PHYSICIAN: Vanderbilt Ned, MD  DIAGNOSIS: There were no encounter diagnoses.  Stage *** Left Breast LIQ, Invasive Ductal Carcinoma, ER+ / PR+ / Her2-, Grade 2  History of left breast DCIS diagnosed in 2009, s/p lumpectomy followed by radiation therapy to the left breast and antiestrogen therapy (tamoxifen)  No diagnosis found.  HISTORY OF PRESENT ILLNESS::[name] is a 63 y.o. female who is presenting to the office today for evaluation of her newly diagnosed breast cancer. She is accompanied by ***. She is doing well overall.   She had routine screening mammography on 10/15/23 showing a possible abnormality in the left breast. She underwent left breast diagnostic mammography with tomography and left breast ultrasonography at [facility] on 10/24/23 which further revealed: a suspicious 1.3 cm mass in the 7 o'clock left breast located 1 cmfn and adjacent to the lumpectomy bed, and several prominent left axillary lymph nodes, the most notable of which with a cortical thickness of 3 mm.   Biopsy of the 7 o'clock left breast mass on 10/26/23 showed: grade 2 invasive ductal carcinoma measuring 8 mm in the greatest linear extent of the sample with focal necrosis. Prognostic indicators significant for: estrogen receptor, 100% positive with strong staining intensity and progesterone receptor, 40% positive with weak staining intensity. Proliferation marker Ki67 at 5%. HER2 status negative. One of the abnormal left axillary lymph nodes was also biopsied at that time and showed no evidence of carcinoma, and findings consistent with a benign reactive lymph node.   Menarche: *** years old Age at first  live birth: *** years old GP: *** LMP: *** Contraceptive: *** HRT: ***   The patient was referred today for presentation in the multidisciplinary conference.  Radiology studies and pathology slides were presented there for review and discussion of treatment options.  A consensus was discussed regarding potential next steps.  PREVIOUS RADIATION THERAPY: Yes   History of left breast DCIS diagnosed in 2009, s/p lumpectomy in July of 2009 followed by radiation therapy to the left breast delivered from 11/04/2007 through 12/17/2007 under Dr. Dewey, followed by antiestrogen therapy consisting of tamoxifen   PAST MEDICAL HISTORY:  Past Medical History:  Diagnosis Date   Anxiety    Asthma    Breast cancer (HCC) 2009   Tamoxifen, left lumpectomy   Cough    Cramps, muscle, general    Depression    Diarrhea    Hypertension    Palpitation    Personal history of radiation therapy    Visual disturbance    Wheezing     PAST SURGICAL HISTORY: Past Surgical History:  Procedure Laterality Date   ABDOMINAL HYSTERECTOMY  09/10/2005   still have ovaries   BIOPSY N/A 09/08/2014   Procedure: BIOPSY random colon;  Surgeon: Margo LITTIE Haddock, MD;  Location: [facility];  Service: Endoscopy;  Laterality: N/A;   BREAST BIOPSY Right 10/2020   Breast tissue with fibrosis.  No atypia or malignancy.   BREAST BIOPSY Left 10/26/2023   US  LT BREAST BX W LOC DEV 1ST LESION IMG BX SPEC US  GUIDE 10/26/2023 [facility] MAMMOGRAPHY   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  COLONOSCOPY WITH PROPOFOL  N/A 09/08/2014   Procedure: COLONOSCOPY WITH PROPOFOL ; in cecum at 0904; withdrawal time 16 minutes;  Surgeon: Margo LITTIE Haddock, MD;  Location: [facility];  Service: Endoscopy;  Laterality: N/A;    FAMILY HISTORY:  Family History  Problem Relation Age of Onset   Diabetes Father    Diabetes Mother    Stroke Mother    Hypertension Mother     Multiple myeloma Mother    Macular degeneration Mother    Congestive Heart Failure Mother    Hypertension Brother    Hypertension Sister    Cancer Paternal Aunt        ovarian   Cancer Cousin        colon   Colon cancer Other        no first degree relatives    SOCIAL HISTORY:  Social History   Socioeconomic History   Marital status: Divorced    Spouse name: Not on file   Number of children: Not on file   Years of education: Not on file   Highest education level: Not on file  Occupational History   Occupation: Social Services  Tobacco Use   Smoking status: Former    Current packs/day: 0.00    Types: Cigarettes    Quit date: 03/02/1990    Years since quitting: 33.7   Smokeless tobacco: Never  Vaping Use   Vaping status: Never Used  Substance and Sexual Activity   Alcohol use: No    Alcohol/week: 0.0 standard drinks of alcohol   Drug use: No   Sexual activity: Not Currently    Birth control/protection: Surgical    Comment: hyst  Other Topics Concern   Not on file  Social History Narrative   Not on file   Social Drivers of Health   Financial Resource Strain: Low Risk  (10/05/2020)   Overall Financial Resource Strain (CARDIA)    Difficulty of Paying Living Expenses: Not very hard  Food Insecurity: No Food Insecurity (10/05/2020)   Hunger Vital Sign    Worried About Running Out of Food in the Last Year: Never true    Ran Out of Food in the Last Year: Never true  Transportation Needs: No Transportation Needs (10/05/2020)   PRAPARE - Administrator, Civil Service (Medical): No    Lack of Transportation (Non-Medical): No  Physical Activity: Insufficiently Active (10/05/2020)   Exercise Vital Sign    Days of Exercise per Week: 5 days    Minutes of Exercise per Session: 20 min  Stress: Stress Concern Present (10/05/2020)   [employer] of Occupational Health - Occupational Stress Questionnaire    Feeling of Stress : To some extent  Social  Connections: Moderately Integrated (10/05/2020)   Social Connection and Isolation Panel    Frequency of Communication with Friends and Family: Twice a week    Frequency of Social Gatherings with Friends and Family: Twice a week    Attends Religious Services: More than 4 times per year    Active Member of [employer] or Organizations: Yes    Attends Engineer, structural: More than 4 times per year    Marital Status: Divorced    ALLERGIES:  Allergies  Allergen Reactions   Ceclor [Cefaclor] Other (See Comments)    Causes pain in esophagus   Erythromycin Hives and Itching   Green Tea (Camellia Sinensis) Other (See Comments)    Eyes swell   Latex     Welts    Other Itching  pineapple   Penicillins Hives and Itching   Tetracycline Hives and Itching   Passion Fruit Flavoring Agent (Non-Screening)    Amoxicillin Hives   Pineapple Itching   Tetracyclines & Related Nausea Only    MEDICATIONS:  Current Outpatient Medications  Medication Sig Dispense Refill   rosuvastatin  (CRESTOR ) 20 MG tablet Take 1 tablet (20 mg total) by mouth at bedtime. 90 tablet 0   tirzepatide  10 MG/0.5ML injection vial Inject 10 mg into the skin once a week. 1.6 mL 3   albuterol  (PROVENTIL ) (2.5 MG/3ML) 0.083% nebulizer solution Take 3 mLs (2.5 mg total) by nebulization every 6 (six) hours as needed for wheezing or shortness of breath. 75 mL 1   albuterol  (VENTOLIN  HFA) 108 (90 Base) MCG/ACT inhaler Inhale 2 puffs into the lungs every 6 (six) hours as needed for wheezing or shortness of breath. 1 each 1   Albuterol -Budesonide  (AIRSUPRA ) 90-80 MCG/ACT AERO Inhale 2 puffs into the lungs every 4 (four) hours as needed. 10.7 g 5   Albuterol -Budesonide  (AIRSUPRA ) 90-80 MCG/ACT AERO Inhale 2 puffs into the lungs every 4 (four) hours as needed.     aspirin  EC 81 MG tablet Take 1 tablet (81 mg total) by mouth daily. Swallow whole. 90 tablet 3   CHOLINE-INOSITOL-METHIONINE-FA PO Take by mouth.     clobetasol   ointment (TEMOVATE ) 0.05 % APPLY 1 APPLICATION TOPICALLY TWO TIMES DAILY. USE FOR 2- 3 WEEKS TOPS. 30 g 0   DULoxetine  (CYMBALTA ) 60 MG capsule Take 1 capsule (60 mg total) by mouth daily. 90 capsule 0   ELDERBERRY PO Take by mouth.     EPINEPHrine  0.3 mg/0.3 mL IJ SOAJ injection Inject 0.3 mg into the muscle as needed for anaphylaxis. 2 each 1   Glucosamine HCl (GLUCOSAMINE PO) Take by mouth.     hydrochlorothiazide  (HYDRODIURIL ) 50 MG tablet Take 1 tablet (50 mg total) by mouth daily. 90 tablet 0   LORazepam  (ATIVAN ) 0.5 MG tablet TAKE 1 TABLET BY MOUTH EVERY 8 HOURS AS NEEDED FOR ANXIETY 90 tablet 0   Multiple Vitamins-Minerals (MULTIVITAMIN WITH MINERALS) tablet Take 1 tablet by mouth daily. Vitamin D3 is in it (1,000Ius)     OIL OF OREGANO PO Take by mouth.     Olopatadine  HCl (PATADAY ) 0.2 % SOLN Place 1 drop into both eyes 1 day or 1 dose. 2.5 mL 5   OVER THE COUNTER MEDICATION Take by mouth daily. Sugar Blockers     potassium chloride SA (K-DUR,KLOR-CON) 20 MEQ tablet Take 20 mEq by mouth daily.     Probiotic Product (PROBIOTIC-10 PO) Take 10 mg by mouth daily. Prebiotic/Probiotic     UNABLE TO FIND Med Name: DIM Supplement     No current facility-administered medications for this encounter.    REVIEW OF SYSTEMS: A 10+ POINT REVIEW OF SYSTEMS WAS OBTAINED including neurology, dermatology, psychiatry, cardiac, respiratory, lymph, extremities, GI, GU, musculoskeletal, constitutional, reproductive, HEENT. On the provided form, she reports ***. She denies *** and any other symptoms.    PHYSICAL EXAM:  vitals were not taken for this visit.  {may need to copy over vitals} Lungs are clear to auscultation bilaterally. Heart has regular rate and rhythm. No palpable cervical, supraclavicular, or axillary adenopathy. Abdomen soft, non-tender, normal bowel sounds. Breast: Right breast with no palpable mass, nipple discharge, or bleeding. Left breast with ***.   KPS = ***  100 - Normal; no  complaints; no evidence of disease. 90   - Able to carry on normal activity; minor  signs or symptoms of disease. 80   - Normal activity with effort; some signs or symptoms of disease. 91   - Cares for self; unable to carry on normal activity or to do active work. 60   - Requires occasional assistance, but is able to care for most of his personal needs. 50   - Requires considerable assistance and frequent medical care. 40   - Disabled; requires special care and assistance. 30   - Severely disabled; hospital admission is indicated although death not imminent. 20   - Very sick; hospital admission necessary; active supportive treatment necessary. 10   - Moribund; fatal processes progressing rapidly. 0     - Dead  Karnofsky DA, Abelmann WH, Craver LS and Burchenal JH [phone]) The use of the nitrogen mustards in the palliative treatment of carcinoma: with particular reference to bronchogenic carcinoma Cancer 1 [id number]  LABORATORY DATA:  Lab Results  Component Value Date   WBC 6.5 08/03/2023   HGB 13.9 08/03/2023   HCT 42.0 08/03/2023   MCV 87 08/03/2023   PLT 242 08/03/2023   Lab Results  Component Value Date   NA 144 09/24/2023   K 3.3 (L) 09/24/2023   CL 99 09/24/2023   CO2 24 09/24/2023   Lab Results  Component Value Date   ALT 20 09/24/2023   AST 20 09/24/2023   ALKPHOS 117 09/24/2023   BILITOT 0.4 09/24/2023    PULMONARY FUNCTION TEST:   Review Flowsheet        No data to display          RADIOGRAPHY: US  LT BREAST BX W LOC DEV 1ST LESION IMG BX SPEC US  GUIDE Addendum Date: 10/29/2023 ADDENDUM REPORT: 10/29/2023 12:41 ADDENDUM: PATHOLOGY revealed: Site 1. Breast, left, needle core biopsy, 7 o'clock, 1 cmfn, ribbon clip: INVASIVE MODERATELY DIFFERENTIATED DUCTAL ADENOCARCINOMA SHOWING FOCAL NECROSIS, GRADE 2. ANGIOLYMPHATIC INVASION NOT IDENTIFIED. NEGATIVE FOR MICROCALCIFICATIONS. TUMOR MEASURES 8 MM IN GREATEST LINEAR EXTENT Pathology results are CONCORDANT with imaging  findings, per Dr. Alm Parkins. PATHOLOGY revealed: Site 2. Lymph node, needle/core biopsy, left axilla, spiral clip : CONSISTENT WITH A BENIGN REACTIVE LYMPH NODE. NEGATIVE FOR CARCINOMA. Pathology results are CONCORDANT with imaging findings, per Dr. Alm Parkins. Pathology results and recommendations below were discussed with patient by telephone on 10/29/2023 by Rock Hover RN. Patient reported biopsy site within normal limits with slight tenderness at the site. Post biopsy care instructions were reviewed, questions were answered and my direct phone number was provided to patient. Patient was instructed to call [facility] if any concerns or questions arise related to the biopsy. RECOMMENDATIONS: 1. Surgical and oncological consultation. Patient was referred to the [facility] at [facility] with appointment on 11/07/2023. Pathology results reported by Rock Hover RN on 10/29/2023. Electronically Signed   By: Alm Parkins M.D.   On: 10/29/2023 12:41   Result Date: 10/29/2023 CLINICAL DATA:  Patient presents for ultrasound-guided core needle biopsy of a left breast mass and a left axillary lymph node. EXAM: ULTRASOUND GUIDED LEFT BREAST CORE NEEDLE BIOPSY US  AXILLARY NODE CORE BIOPSY LEFT COMPARISON:  Previous exam(s). PROCEDURE: I met with the patient and we discussed the procedure of ultrasound-guided biopsy, including benefits and alternatives. We discussed the high likelihood of a successful procedure. We discussed the risks of the procedure, including infection, bleeding, tissue injury, clip migration, and inadequate sampling. Informed written consent was given. The usual time-out protocol was performed immediately prior  to the procedure. Biopsy #1: 1.3 cm mass, 7 o'clock, 1 cm from the nipple. Lesion quadrant: Lower inner quadrant Using sterile technique and 1% Lidocaine  as local anesthetic, under direct ultrasound visualization,  a 14 gauge spring-loaded device was used to perform biopsy of the irregular mass at 7 o'clock, 1 cm from the nipple, posterior depth, using an inferior approach. At the conclusion of the procedure a ribbon shaped tissue marker clip was deployed into the biopsy cavity. Biopsy #2: Borderline abnormal left axillary lymph node. Lesion location: Left axilla. Using sterile technique and 1% Lidocaine  as local anesthetic, under direct ultrasound visualization, a 14 gauge spring-loaded device was used to perform biopsy of the left axillary lymph node using an anterolateral approach. At the conclusion of the procedure a HydroMARK, spiral shaped tissue marker clip was deployed into the biopsy cavity. Follow up 2 view mammogram was performed and dictated separately. IMPRESSION: Ultrasound guided biopsy of a left breast mass and a left axillary lymph node. No apparent complications. Electronically Signed: By: Alm Parkins M.D. On: 10/26/2023 09:24   US  AXILLARY NODE CORE BIOPSY LEFT Addendum Date: 10/29/2023 ADDENDUM REPORT: 10/29/2023 12:41 ADDENDUM: PATHOLOGY revealed: Site 1. Breast, left, needle core biopsy, 7 o'clock, 1 cmfn, ribbon clip: INVASIVE MODERATELY DIFFERENTIATED DUCTAL ADENOCARCINOMA SHOWING FOCAL NECROSIS, GRADE 2. ANGIOLYMPHATIC INVASION NOT IDENTIFIED. NEGATIVE FOR MICROCALCIFICATIONS. TUMOR MEASURES 8 MM IN GREATEST LINEAR EXTENT Pathology results are CONCORDANT with imaging findings, per Dr. Alm Parkins. PATHOLOGY revealed: Site 2. Lymph node, needle/core biopsy, left axilla, spiral clip : CONSISTENT WITH A BENIGN REACTIVE LYMPH NODE. NEGATIVE FOR CARCINOMA. Pathology results are CONCORDANT with imaging findings, per Dr. Alm Parkins. Pathology results and recommendations below were discussed with patient by telephone on 10/29/2023 by Rock Hover RN. Patient reported biopsy site within normal limits with slight tenderness at the site. Post biopsy care instructions were reviewed, questions were answered  and my direct phone number was provided to patient. Patient was instructed to call [facility] if any concerns or questions arise related to the biopsy. RECOMMENDATIONS: 1. Surgical and oncological consultation. Patient was referred to the [facility] at [facility] with appointment on 11/07/2023. Pathology results reported by Rock Hover RN on 10/29/2023. Electronically Signed   By: Alm Parkins M.D.   On: 10/29/2023 12:41   Result Date: 10/29/2023 CLINICAL DATA:  Patient presents for ultrasound-guided core needle biopsy of a left breast mass and a left axillary lymph node. EXAM: ULTRASOUND GUIDED LEFT BREAST CORE NEEDLE BIOPSY US  AXILLARY NODE CORE BIOPSY LEFT COMPARISON:  Previous exam(s). PROCEDURE: I met with the patient and we discussed the procedure of ultrasound-guided biopsy, including benefits and alternatives. We discussed the high likelihood of a successful procedure. We discussed the risks of the procedure, including infection, bleeding, tissue injury, clip migration, and inadequate sampling. Informed written consent was given. The usual time-out protocol was performed immediately prior to the procedure. Biopsy #1: 1.3 cm mass, 7 o'clock, 1 cm from the nipple. Lesion quadrant: Lower inner quadrant Using sterile technique and 1% Lidocaine  as local anesthetic, under direct ultrasound visualization, a 14 gauge spring-loaded device was used to perform biopsy of the irregular mass at 7 o'clock, 1 cm from the nipple, posterior depth, using an inferior approach. At the conclusion of the procedure a ribbon shaped tissue marker clip was deployed into the biopsy cavity. Biopsy #2: Borderline abnormal left axillary lymph node. Lesion location: Left axilla. Using sterile technique and 1% Lidocaine  as  local anesthetic, under direct ultrasound visualization, a 14 gauge spring-loaded device was used to perform biopsy of the left axillary  lymph node using an anterolateral approach. At the conclusion of the procedure a HydroMARK, spiral shaped tissue marker clip was deployed into the biopsy cavity. Follow up 2 view mammogram was performed and dictated separately. IMPRESSION: Ultrasound guided biopsy of a left breast mass and a left axillary lymph node. No apparent complications. Electronically Signed: By: Alm Parkins M.D. On: 10/26/2023 09:24   MM CLIP PLACEMENT LEFT Result Date: 10/26/2023 CLINICAL DATA:  Assess post biopsy marker clip placements following ultrasound-guided core needle biopsy of a left breast mass and a left axillary lymph node. EXAM: 3D DIAGNOSTIC LEFT MAMMOGRAM POST ULTRASOUND BIOPSY COMPARISON:  Previous exam(s). ACR Breast Density Category b: There are scattered areas of fibroglandular density. FINDINGS: 3D Mammographic images were obtained following ultrasound guided biopsy of a left breast mass and a left axillary lymph node. The ribbon shaped biopsy clip lies within the mass along the medial margin of the lumpectomy site. [facility], spiral clip lies within the left axillary lymph node. IMPRESSION: Appropriate positioning of the ribbon and spiral shaped biopsy marking clips at the site of biopsy in the left breast and left axilla as detailed above. Final Assessment: Post Procedure Mammograms for Marker Placement Electronically Signed   By: Alm Parkins M.D.   On: 10/26/2023 09:34   MM 3D DIAGNOSTIC MAMMOGRAM UNILATERAL LEFT BREAST Result Date: 10/24/2023 CLINICAL DATA:  Recall from screening for a possible left breast asymmetry. Patient has history of a left lumpectomy for high-grade DCIS, which was performed in 2009. She received adjuvant radiation therapy. She also has a history of a right breast benign biopsy. EXAM: DIGITAL DIAGNOSTIC UNILATERAL LEFT MAMMOGRAM WITH TOMOSYNTHESIS AND CAD; ULTRASOUND LEFT BREAST LIMITED TECHNIQUE: Left digital diagnostic mammography and breast tomosynthesis was performed. The  images were evaluated with computer-aided detection. ; Targeted ultrasound examination of the left breast was performed. COMPARISON:  Previous exam(s). ACR Breast Density Category b: There are scattered areas of fibroglandular density. FINDINGS: On spot compression imaging, the possible asymmetry noted on the screening exam persists as an oval, 9 mm mass that lies along the inferomedial margin of the lumpectomy bed. Margins are partly circumscribed and partly ill-defined. On physical exam, no mass is palpated in the retroareolar lower inner left breast. Targeted ultrasound is performed, showing a hypoechoic irregular mass in the left breast at 7 o'clock, 1 cm from the nipple, measuring 1.3 x 0.8 x 1.0 cm. This lies directly adjacent to the lumpectomy bed. No internal blood flow is noted on color Doppler analysis. The lesion corresponds to the mammographic mass. Sonographic imaging of the left axilla demonstrates several lymph nodes with prominent cortices, measuring up to 3 mm. One node shows eccentric cortical thickening measuring 3 mm. IMPRESSION: 1. 1.3 cm irregular mass in the left breast at 7 o'clock, 1 cm from the nipple, adjacent to the lumpectomy bed. This is suspicious for malignancy. 2. Several prominent left axillary lymph nodes, the most notable showing 3 mm eccentric cortical thickening. RECOMMENDATION: 1. Left ultrasound-guided core needle biopsies x2. Recommend ultrasound-guided core needle biopsy of the 1.3 cm mass in the left breast at 7 o'clock and the lymph node with the borderline, eccentrically thickened cortex. I have discussed the findings and recommendations with the patient. If applicable, a reminder letter will be sent to the patient regarding the next appointment. BI-RADS CATEGORY  4: Suspicious. Electronically Signed   By: Alm  Ormond M.D.   On: 10/24/2023 10:19   US  LIMITED ULTRASOUND INCLUDING AXILLA LEFT BREAST  Result Date: 10/24/2023 CLINICAL DATA:  Recall from screening for a  possible left breast asymmetry. Patient has history of a left lumpectomy for high-grade DCIS, which was performed in 2009. She received adjuvant radiation therapy. She also has a history of a right breast benign biopsy. EXAM: DIGITAL DIAGNOSTIC UNILATERAL LEFT MAMMOGRAM WITH TOMOSYNTHESIS AND CAD; ULTRASOUND LEFT BREAST LIMITED TECHNIQUE: Left digital diagnostic mammography and breast tomosynthesis was performed. The images were evaluated with computer-aided detection. ; Targeted ultrasound examination of the left breast was performed. COMPARISON:  Previous exam(s). ACR Breast Density Category b: There are scattered areas of fibroglandular density. FINDINGS: On spot compression imaging, the possible asymmetry noted on the screening exam persists as an oval, 9 mm mass that lies along the inferomedial margin of the lumpectomy bed. Margins are partly circumscribed and partly ill-defined. On physical exam, no mass is palpated in the retroareolar lower inner left breast. Targeted ultrasound is performed, showing a hypoechoic irregular mass in the left breast at 7 o'clock, 1 cm from the nipple, measuring 1.3 x 0.8 x 1.0 cm. This lies directly adjacent to the lumpectomy bed. No internal blood flow is noted on color Doppler analysis. The lesion corresponds to the mammographic mass. Sonographic imaging of the left axilla demonstrates several lymph nodes with prominent cortices, measuring up to 3 mm. One node shows eccentric cortical thickening measuring 3 mm. IMPRESSION: 1. 1.3 cm irregular mass in the left breast at 7 o'clock, 1 cm from the nipple, adjacent to the lumpectomy bed. This is suspicious for malignancy. 2. Several prominent left axillary lymph nodes, the most notable showing 3 mm eccentric cortical thickening. RECOMMENDATION: 1. Left ultrasound-guided core needle biopsies x2. Recommend ultrasound-guided core needle biopsy of the 1.3 cm mass in the left breast at 7 o'clock and the lymph node with the borderline,  eccentrically thickened cortex. I have discussed the findings and recommendations with the patient. If applicable, a reminder letter will be sent to the patient regarding the next appointment. BI-RADS CATEGORY  4: Suspicious. Electronically Signed   By: Alm Parkins M.D.   On: 10/24/2023 10:19   MM 3D SCREENING MAMMOGRAM BILATERAL BREAST Result Date: 10/17/2023 CLINICAL DATA:  Screening. EXAM: DIGITAL SCREENING BILATERAL MAMMOGRAM WITH TOMOSYNTHESIS AND CAD TECHNIQUE: Bilateral screening digital craniocaudal and mediolateral oblique mammograms were obtained. Bilateral screening digital breast tomosynthesis was performed. The images were evaluated with computer-aided detection. COMPARISON:  Previous exam(s). ACR Breast Density Category b: There are scattered areas of fibroglandular density. FINDINGS: In the left breast, a possible asymmetry warrants further evaluation. In the right breast, no findings suspicious for malignancy. IMPRESSION: Further evaluation is suggested for possible asymmetry in the left breast. RECOMMENDATION: Diagnostic mammogram and possibly ultrasound of the left breast. (Code:[id number]) The patient will be contacted regarding the findings, and additional imaging will be scheduled. BI-RADS CATEGORY  0: Incomplete: Need additional imaging evaluation. Electronically Signed   By: Toribio Agreste M.D.   On: 10/17/2023 12:56      IMPRESSION: Stage *** Left Breast LIQ, Invasive Ductal Carcinoma, ER+ / PR+ / Her2-, Grade 2  History of left breast DCIS diagnosed in 2009, s/p lumpectomy followed by radiation therapy to the left breast and antiestrogen therapy (tamoxifen)  Patient will be a good candidate for breast conservation with radiotherapy to *** breast. We discussed the general course of radiation, potential side effects, and toxicities with radiation and the patient is interested in  this approach. ***   PLAN:  ***    ------------------------------------------------  Lynwood CHARM Nasuti, PhD, MD  This document serves as a record of services personally performed by Lynwood Nasuti, MD. It was created on his behalf by Dorthy Fuse, a trained medical scribe. The creation of this record is based on the scribe's personal observations and the provider's statements to them. This document has been checked and approved by the attending provider.

## 2023-11-07 ENCOUNTER — Ambulatory Visit: Attending: Surgery | Admitting: Physical Therapy

## 2023-11-07 ENCOUNTER — Encounter: Payer: Self-pay | Admitting: Physical Therapy

## 2023-11-07 ENCOUNTER — Ambulatory Visit: Payer: Self-pay | Admitting: Surgery

## 2023-11-07 ENCOUNTER — Encounter: Payer: Self-pay | Admitting: *Deleted

## 2023-11-07 ENCOUNTER — Ambulatory Visit
Admission: RE | Admit: 2023-11-07 | Discharge: 2023-11-07 | Disposition: A | Discharge status: Home / Self Care | Source: Ambulatory Visit | Attending: Radiation Oncology | Admitting: Radiation Oncology

## 2023-11-07 ENCOUNTER — Other Ambulatory Visit: Payer: Self-pay

## 2023-11-07 ENCOUNTER — Inpatient Hospital Stay (HOSPITAL_BASED_OUTPATIENT_CLINIC_OR_DEPARTMENT_OTHER): Admitting: Genetic Counselor

## 2023-11-07 ENCOUNTER — Ambulatory Visit (INDEPENDENT_AMBULATORY_CARE_PROVIDER_SITE_OTHER): Payer: Self-pay

## 2023-11-07 ENCOUNTER — Inpatient Hospital Stay: Admitting: Licensed Clinical Social Worker

## 2023-11-07 ENCOUNTER — Inpatient Hospital Stay: Admitting: Hematology and Oncology

## 2023-11-07 ENCOUNTER — Encounter: Payer: Self-pay | Admitting: Genetic Counselor

## 2023-11-07 ENCOUNTER — Inpatient Hospital Stay: Attending: Hematology and Oncology

## 2023-11-07 DIAGNOSIS — C50312 Malignant neoplasm of lower-inner quadrant of left female breast: Secondary | ICD-10-CM | POA: Insufficient documentation

## 2023-11-07 DIAGNOSIS — J309 Allergic rhinitis, unspecified: Secondary | ICD-10-CM

## 2023-11-07 DIAGNOSIS — R293 Abnormal posture: Secondary | ICD-10-CM | POA: Insufficient documentation

## 2023-11-07 DIAGNOSIS — D0512 Intraductal carcinoma in situ of left breast: Secondary | ICD-10-CM

## 2023-11-07 DIAGNOSIS — Z170 Estrogen receptor positive status [ER+]: Secondary | ICD-10-CM

## 2023-11-07 DIAGNOSIS — Z8042 Family history of malignant neoplasm of prostate: Secondary | ICD-10-CM

## 2023-11-07 DIAGNOSIS — Z801 Family history of malignant neoplasm of trachea, bronchus and lung: Secondary | ICD-10-CM

## 2023-11-07 DIAGNOSIS — C50512 Malignant neoplasm of lower-outer quadrant of left female breast: Secondary | ICD-10-CM

## 2023-11-07 DIAGNOSIS — Z8041 Family history of malignant neoplasm of ovary: Secondary | ICD-10-CM

## 2023-11-07 DIAGNOSIS — Z803 Family history of malignant neoplasm of breast: Secondary | ICD-10-CM

## 2023-11-07 DIAGNOSIS — Z86000 Personal history of in-situ neoplasm of breast: Secondary | ICD-10-CM | POA: Insufficient documentation

## 2023-11-07 LAB — CBC WITH DIFFERENTIAL (CANCER CENTER ONLY)
Abs Immature Granulocytes: 0 K/uL (ref 0.00–0.07)
Basophils Absolute: 0 K/uL (ref 0.0–0.1)
Basophils Relative: 1 %
Eosinophils Absolute: 0.1 K/uL (ref 0.0–0.5)
Eosinophils Relative: 3 %
HCT: 38.8 % (ref 36.0–46.0)
Hemoglobin: 13.4 g/dL (ref 12.0–15.0)
Immature Granulocytes: 0 %
Lymphocytes Relative: 45 %
Lymphs Abs: 1.9 K/uL (ref 0.7–4.0)
MCH: 28.5 pg (ref 26.0–34.0)
MCHC: 34.5 g/dL (ref 30.0–36.0)
MCV: 82.6 fL (ref 80.0–100.0)
Monocytes Absolute: 0.5 K/uL (ref 0.1–1.0)
Monocytes Relative: 13 %
Neutro Abs: 1.6 K/uL — ABNORMAL LOW (ref 1.7–7.7)
Neutrophils Relative %: 38 %
Platelet Count: 245 K/uL (ref 150–400)
RBC: 4.7 MIL/uL (ref 3.87–5.11)
RDW: 13.2 % (ref 11.5–15.5)
WBC Count: 4.2 K/uL (ref 4.0–10.5)
nRBC: 0 % (ref 0.0–0.2)

## 2023-11-07 LAB — CMP (CANCER CENTER ONLY)
ALT: 17 U/L (ref 0–44)
AST: 20 U/L (ref 15–41)
Albumin: 4.1 g/dL (ref 3.5–5.0)
Alkaline Phosphatase: 75 U/L (ref 38–126)
Anion gap: 9 (ref 5–15)
BUN: 17 mg/dL (ref 6–20)
CO2: 31 mmol/L (ref 22–32)
Calcium: 9.3 mg/dL (ref 8.9–10.3)
Chloride: 100 mmol/L (ref 98–111)
Creatinine: 0.63 mg/dL (ref 0.44–1.00)
GFR, Estimated: >60 mL/min (ref >60)
Glucose, Bld: 82 mg/dL (ref 70–99)
Potassium: 3 mmol/L — ABNORMAL LOW (ref 3.5–5.1)
Sodium: 140 mmol/L (ref 135–145)
Total Bilirubin: 0.4 mg/dL (ref 0.0–1.2)
Total Protein: 7.1 g/dL (ref 6.5–8.1)

## 2023-11-07 LAB — GENETIC SCREENING ORDER
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
## 2023-11-07 NOTE — Therapy (Signed)
 OUTPATIENT PHYSICAL THERAPY BREAST CANCER BASELINE EVALUATION   Patient Name: [name] MRN: [id number] DOB:06-13-1960, 63 y.o., female Today's Date: 11/07/2023  END OF SESSION:  PT End of Session - 11/07/23 1053     Visit Number 1    Number of Visits 2    Date for PT Re-Evaluation 01/02/24    PT Start Time 1008    PT Stop Time 1044    PT Time Calculation (min) 36 min    Activity Tolerance Patient tolerated treatment well    Behavior During Therapy [facility] for tasks assessed/performed          Past Medical History:  Diagnosis Date   Anxiety    Asthma    Breast cancer (HCC) 2009   Tamoxifen, left lumpectomy   Cough    Cramps, muscle, general    Depression    Diarrhea    Diverticulosis    Eczema    Hypertension    Palpitation    Personal history of radiation therapy    Visual disturbance    Wheezing    Past Surgical History:  Procedure Laterality Date   ABDOMINAL HYSTERECTOMY  09/10/2005   still have ovaries   BIOPSY N/A 09/08/2014   Procedure: BIOPSY random colon;  Surgeon: Margo LITTIE Haddock, MD;  Location: [facility];  Service: Endoscopy;  Laterality: N/A;   BREAST BIOPSY Right 10/2020   Breast tissue with fibrosis.  No atypia or malignancy.   BREAST BIOPSY Left 10/26/2023   US  LT BREAST BX W LOC DEV 1ST LESION IMG BX SPEC US  GUIDE 10/26/2023 [facility] MAMMOGRAPHY   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   COLONOSCOPY WITH PROPOFOL  N/A 09/08/2014   Procedure: COLONOSCOPY WITH PROPOFOL ; in cecum at 0904; withdrawal time 16 minutes;  Surgeon: Margo LITTIE Haddock, MD;  Location: [facility];  Service: Endoscopy;  Laterality: N/A;   Patient Active Problem List   Diagnosis Date Noted   Malignant neoplasm of lower-inner quadrant of left breast in female, estrogen receptor positive (HCC) 11/05/2023   Non-pitting edema 09/24/2023   Generalized rash 07/04/2023   Morbid obesity (HCC)  05/06/2023   Prediabetes 10/04/2022   Anxiety and depression 09/04/2022   Asthma 09/04/2022   Essential hypertension 09/04/2022   Hyperlipidemia 09/04/2022   Hx of migraines 09/04/2022   Abdominal cramping 09/04/2022   History of breast cancer 10/06/2021   Hot flashes 10/06/2021   Abdominal bloating 10/05/2020   Encounter for screening fecal occult blood testing 10/05/2020   Encounter for gynecological examination with Papanicolaou smear of cervix 10/05/2020   S/P abdominal supracervical subtotal hysterectomy 10/05/2020   Encounter for screening colonoscopy 08/21/2014   DCIS (ductal carcinoma in situ) 03/03/2011   Breast cancer of lower-outer quadrant of left female breast (HCC) 12/13/2009   Anxiety state 12/13/2009   CHEST PAIN 12/13/2009    REFERRING PROVIDER: Dr. Debby Shipper  REFERRING DIAG: Left breast cancer  THERAPY DIAG:  Malignant neoplasm of lower-inner quadrant of left breast in female, estrogen receptor positive (HCC)  Abnormal posture  History of ductal carcinoma in situ (DCIS) of left breast  Rationale for Evaluation and Treatment: Rehabilitation  ONSET DATE: 10/15/2023  SUBJECTIVE:  SUBJECTIVE STATEMENT: Patient reports she is here today to be seen by her medical team for her newly diagnosed left breast cancer.   PERTINENT HISTORY:  Patient was diagnosed on 10/15/2023 with left grade 2 invasive ductal carcinoma breast cancer. It measures 1.3 cm and is located in the lower inner quadrant. It is ER/PR positive and HER2 negative with a Ki67 of 5%. She has a history of left breast DCIS in 2009 and was treated with a lumpectomy and radiation.  PATIENT GOALS:   reduce lymphedema risk and learn post op HEP.   PAIN:  Are you having pain? No  PRECAUTIONS: Active CA   RED  FLAGS: None   HAND DOMINANCE: right  WEIGHT BEARING RESTRICTIONS: No  FALLS:  Has patient fallen in last 6 months? No  LIVING ENVIRONMENT: Patient lives with: her 11 y.o. dad and 34 y.o. brother Lives in: House/apartment Has following equipment at home: None  OCCUPATION: Retired from [employer]  LEISURE: She does not exercise  PRIOR LEVEL OF FUNCTION: Independent   OBJECTIVE: Note: Objective measures were completed at Evaluation unless otherwise noted.  COGNITION: Overall cognitive status: Within functional limits for tasks assessed    POSTURE:  Forward head and rounded shoulders posture  UPPER EXTREMITY AROM/PROM:  A/PROM RIGHT   eval   Shoulder extension 43  Shoulder flexion 155  Shoulder abduction 157  Shoulder internal rotation 77  Shoulder external rotation 84    (Blank rows = not tested)  A/PROM LEFT   eval  Shoulder extension 46  Shoulder flexion 147  Shoulder abduction 161  Shoulder internal rotation 75  Shoulder external rotation 90    (Blank rows = not tested)  CERVICAL AROM: All within normal limits  UPPER EXTREMITY STRENGTH: WNL  LYMPHEDEMA ASSESSMENTS (in cm):   LANDMARK RIGHT   eval  10 cm proximal to olecranon process 38  Olecranon process 28.5  10 cm proximal to ulnar styloid process 24.5  Just proximal to ulnar styloid process 15.1  Across hand at thumb web space 17.6  At base of 2nd digit 6.2  (Blank rows = not tested)  LANDMARK LEFT   eval  10 cm proximal to olecranon process 38.4  Olecranon process 28  10 cm proximal to ulnar styloid process 22.2  Just proximal to ulnar styloid process 15.4  Across hand at thumb web space 19.4  At base of 2nd digit 6.4  (Blank rows = not tested)  L-DEX LYMPHEDEMA SCREENING:  The patient was assessed using the L-Dex machine today to produce a lymphedema index baseline score. The patient will be reassessed on a regular basis (typically every 3 months) to obtain new L-Dex scores. If the score  is > 6.5 points away from his/her baseline score indicating onset of subclinical lymphedema, it will be recommended to wear a compression garment for 4 weeks, 12 hours per day and then be reassessed. If the score continues to be > 6.5 points from baseline at reassessment, we will initiate lymphedema treatment. Assessing in this manner has a 95% rate of preventing clinically significant lymphedema.   L-DEX FLOWSHEETS - 11/07/23 1000       L-DEX LYMPHEDEMA SCREENING   Measurement Type Unilateral    L-DEX MEASUREMENT EXTREMITY Upper Extremity    POSITION  Standing    DOMINANT SIDE Right    At Risk Side Left    BASELINE SCORE (UNILATERAL) 11          QUICK DASH SURVEY:  Junie Palin - 11/07/23 0001  Open a tight or new jar Mild difficulty    Do heavy household chores (wash walls, wash floors) Moderate difficulty    Carry a shopping bag or briefcase Mild difficulty    Wash your back No difficulty    Use a knife to cut food No difficulty    Recreational activities in which you take some force or impact through your arm, shoulder, or hand (golf, hammering, tennis) Mild difficulty    During the past week, to what extent has your arm, shoulder or hand problem interfered with your normal social activities with family, friends, neighbors, or groups? Slightly    During the past week, to what extent has your arm, shoulder or hand problem limited your work or other regular daily activities Slightly    Arm, shoulder, or hand pain. None    Tingling (pins and needles) in your arm, shoulder, or hand None    Difficulty Sleeping No difficulty    DASH Score 15.91 %           PATIENT EDUCATION:  Education details: Time spent educating patient on aspects of self-care to maximize post op recovery. Patient was educated on where and how to get a post op compression bra to use to reduce post op edema. Patient was also educated on the use of SOZO screenings and surveillance principles for early  identification of lymphedema onset. She was instructed to use the post op pillow in the axilla for pressure and pain relief. Patient educated on lymphedema risk reduction and post op shoulder/posture HEP. Person educated: Patient Education method: Explanation, Demonstration, Handout Education comprehension: Patient verbalized understanding and returned demonstration  HOME EXERCISE PROGRAM: Patient was instructed today in a home exercise program today for post op shoulder range of motion. These included active assist shoulder flexion in sitting, scapular retraction, wall walking with shoulder abduction, and hands behind head external rotation.  She was encouraged to do these twice a day, holding 3 seconds and repeating 5 times when permitted by her physician.   ASSESSMENT:  CLINICAL IMPRESSION: Patient was diagnosed on 10/15/2023 with left grade 2 invasive ductal carcinoma breast cancer. It measures 1.3 cm and is located in the lower inner quadrant. It is ER/PR positive and HER2 negative with a Ki67 of 5%. She has a history of left breast DCIS in 2009 and was treated with a lumpectomy and radiation. Her multidisciplinary medical team met prior to her assessments to determine a recommended treatment plan. She is planning to have a bilateral mastectomy and sentinel node biopsy on the left followed by Oncotype testing and anti-estrogen therapy. She will benefit from a post op PT reassessment to determine needs and from L-Dex screens every 3 months for 2 years to detect subclinical lymphedema.  Pt will benefit from skilled therapeutic intervention to improve on the following deficits: Decreased knowledge of precautions, impaired UE functional use, pain, decreased ROM, postural dysfunction.   PT treatment/interventions: ADL/self-care home management, pt/family education, therapeutic exercise  REHAB POTENTIAL: Excellent  CLINICAL DECISION MAKING: Stable/uncomplicated  EVALUATION COMPLEXITY:  Low   GOALS: Goals reviewed with patient? YES  LONG TERM GOALS: (STG=LTG)    Name Target Date Goal status  1 Pt will be able to verbalize understanding of pertinent lymphedema risk reduction practices relevant to her dx specifically related to skin care.  Baseline:  No knowledge 11/07/2023 Achieved at eval  2 Pt will be able to return demo and/or verbalize understanding of the post op HEP related to regaining shoulder ROM. Baseline:  No knowledge 11/07/2023 Achieved at eval  3 Pt will be able to verbalize understanding of the importance of viewing the post op After Breast CA Class video for further lymphedema risk reduction education and therapeutic exercise.  Baseline:  No knowledge 11/07/2023 Achieved at eval  4 Pt will demo she has regained full shoulder ROM and function post operatively compared to baselines.  Baseline: See objective measurements taken today. 01/02/2024     PLAN:  PT FREQUENCY/DURATION: EVAL and 1 follow up appointment.   PLAN FOR NEXT SESSION: will reassess 3-4 weeks post op to determine needs.   Patient will follow up at outpatient cancer rehab 3-4 weeks following surgery.  If the patient requires physical therapy at that time, a specific plan will be dictated and sent to the referring physician for approval. The patient was educated today on appropriate basic range of motion exercises to begin post operatively and the importance of viewing the After Breast Cancer class video following surgery.  Patient was educated today on lymphedema risk reduction practices as it pertains to recommendations that will benefit the patient immediately following surgery.  She verbalized good understanding.    Physical Therapy Information for After Breast Cancer Surgery/Treatment:  Lymphedema is a swelling condition that you may be at risk for in your arm if you have lymph nodes removed from the armpit area.  After a sentinel node biopsy, the risk is approximately 5-9% and is higher  after an axillary node dissection.  There is treatment available for this condition and it is not life-threatening.  Contact your physician or physical therapist with concerns. You may begin the 4 shoulder/posture exercises (see additional sheet) when permitted by your physician (typically a week after surgery).  If you have drains, you may need to wait until those are removed before beginning range of motion exercises.  A general recommendation is to not lift your arms above shoulder height until drains are removed.  These exercises should be done to your tolerance and gently.  This is not a no pain/no gain type of recovery so listen to your body and stretch into the range of motion that you can tolerate, stopping if you have pain.  If you are having immediate reconstruction, ask your plastic surgeon about doing exercises as he or she may want you to wait. We encourage you to view the After Breast Cancer class video following surgery.  You will learn information related to lymphedema risk, prevention and treatment and additional exercises to regain mobility following surgery.   While undergoing any medical procedure or treatment, try to avoid blood pressure being taken or needle sticks from occurring on the arm on the side of cancer.   This recommendation begins after surgery and continues for the rest of your life.  This may help reduce your risk of getting lymphedema (swelling in your arm). An excellent resource for those seeking information on lymphedema is the National Lymphedema Network's web site. It can be accessed at www.lymphnet.org If you notice swelling in your hand, arm or breast at any time following surgery (even if it is many years from now), please contact your doctor or physical therapist to discuss this.  Lymphedema can be treated at any time but it is easier for you if it is treated early on.  If you feel like your shoulder motion is not returning to normal in a reasonable amount of time,  please contact your surgeon or physical therapist.  [facility] [phone]. [phone]  [address], [address], [place] [address]  [facility] After Breast Cancer Class  After Breast Cancer Class is a specially designed exercise class video to assist you in a safe recover after having breast cancer surgery.  In this video you will learn how to get back to full function whether your drains were just removed or if you had surgery a month ago. The video can be viewed on this page: [email or web address].boyd-meyer.org/ or on YouTube here: [email or web address].  Class Goals  Understand specific stretches to improve the flexibility of you chest and shoulder. Learn ways to safely strengthen your upper body and improve your posture. Understand the warning signs of infection and why you may be at risk for an arm infection. Learn about Lymphedema and prevention.  ** You do not need to view this video until after surgery.  Drains should be removed to participate in the recommended exercises on the video.  Patient was instructed today in a home exercise program today for post op shoulder range of motion. These included active assist shoulder flexion in sitting, scapular retraction, wall walking with shoulder abduction, and hands behind head external rotation.  She was encouraged to do these twice a day, holding 3 seconds and repeating 5 times when permitted by her physician.  Eward Wonda Sharps, [place] 11/07/23 11:01 AM

## 2023-11-07 NOTE — Assessment & Plan Note (Addendum)
 This is a very pleasant 63 year old female patient with past medical history significant for left breast DCIS back in 2009 status postlumpectomy, adjuvant radiation and 5 years of tamoxifen referred to medical oncology for new diagnosis of left breast IDC. On mammogram and ultrasound, she has a new left breast invasive ductal carcinoma, grade 2 measuring 1.3 cm ER positive strong staining PR positive weak staining, Ki-67 5% HER2 negative.  She is hoping to proceed with bilateral mastectomy given this is the second episode of breast cancer.  She is not quite certain about reconstruction.  We have reviewed indication for Oncotype DX testing.  We have discussed the following details about Oncotype DX. We have discussed about Oncotype Dx score which is a well validated prognostic scoring system which can predict outcome with endocrine therapy alone and whether chemotherapy reduces recurrence.  Typically in patients with ER positive cancers that are node negative if the RS score is high typically greater than or equal to 26, chemotherapy is recommended.   We have also discussed options for antiestrogen therapy today  With regards to Tamoxifen, we discussed that this is a SERM, selective estrogen receptor modulator. We discussed mechanism of action of Tamoxifen, adverse effects on Tamoxifen including but not limited to post menopausal symptoms, increased risk of DVT/PE, increased risk of endometrial cancer, questionable cataracts with long term use and increased risk of cardiovascular events in the study which was not statistically significant. A benefit from Tamoxifen would be improvement in bone density. With regards to aromatase inhibitors, we discussed mechanism of action, adverse effects including but not limited to post menopausal symptoms, arthralgias, myalgias, increased risk of cardiovascular events and bone loss.   She will think about the anti estrogen therapy options available.  Amber Stalls  MD

## 2023-11-07 NOTE — Progress Notes (Addendum)
 REFERRING PROVIDER: Loretha Ash, MD  PRIMARY PROVIDER:  Glennon Sand, NP  PRIMARY REASON FOR VISIT:  Encounter Diagnoses  Name Primary?   Malignant neoplasm of lower-outer quadrant of left breast of female, estrogen receptor positive (HCC) Yes   Ductal carcinoma in situ (DCIS) of left breast    Family history of prostate cancer    Family history of breast cancer    Family history of ovarian cancer    Family history of lung cancer    HISTORY OF PRESENT ILLNESS:   Ms. Stonehouse, a 63 y.o. female, was seen for a [place] cancer genetics consultation during the breast multidisciplinary clinic at the request of Dr. Loretha due to a personal and family history of cancer.  Ms. Rieves presents to clinic today to discuss the possibility of a hereditary predisposition to cancer, to discuss genetic testing, and to further clarify her future cancer risks, as well as potential cancer risks for family members.   In August 2025, at the age of 24, Ms. Axe was diagnosed with invasive ductal carcinoma of the left breast (ER/PR positive, HER2 negative). 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 Of note, she recently had limited genetic testing through the [employer] program and tested negative for BRCA1/2 and Lynch Syndrome gene mutations. The treatment plan includes a bilateral mastectomy.   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)  09/25/2007 Surgery   Left breast lumpectomy: DCIS ER/PR positive   11/04/2007 - 12/17/2007 Radiation Therapy   Adjuvant radiation therapy   03/26/2008 - 03/27/2013 Anti-estrogen oral therapy   Tamoxifen 20 mg daily     Past Medical History:  Diagnosis Date   Anxiety    Asthma    Breast cancer (HCC) 2009   Tamoxifen, left lumpectomy   Cough    Cramps, muscle, general    Depression    Diarrhea    Diverticulosis    Eczema    Hypertension    Palpitation     Personal history of radiation therapy    Visual disturbance    Wheezing     Past Surgical History:  Procedure Laterality Date   ABDOMINAL HYSTERECTOMY  09/10/2005   still have ovaries   BIOPSY N/A 09/08/2014   Procedure: BIOPSY random colon;  Surgeon: Margo LITTIE Haddock, MD;  Location: [facility];  Service: Endoscopy;  Laterality: N/A;   BREAST BIOPSY Right 10/2020   Breast tissue with fibrosis.  No atypia or malignancy.   BREAST BIOPSY Left 10/26/2023   US  LT BREAST BX W LOC DEV 1ST LESION IMG BX SPEC US  GUIDE 10/26/2023 [facility] MAMMOGRAPHY   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   COLONOSCOPY WITH PROPOFOL  N/A 09/08/2014   Procedure: COLONOSCOPY WITH PROPOFOL ; in cecum at 0904; withdrawal time 16 minutes;  Surgeon: Margo LITTIE Haddock, MD;  Location: [facility];  Service: Endoscopy;  Laterality: N/A;    Social History   Socioeconomic History   Marital status: Divorced    Spouse name: Not on file   Number of children: Not on file   Years of education: Not on file   Highest education level: Not on file  Occupational History   Occupation: Social Services  Tobacco Use   Smoking status: Former    Current packs/day: 0.00    Types: Cigarettes    Quit date: 03/02/1990  Years since quitting: 33.7   Smokeless tobacco: Never  Vaping Use   Vaping status: Never Used  Substance and Sexual Activity   Alcohol use: No    Alcohol/week: 0.0 standard drinks of alcohol   Drug use: No   Sexual activity: Not Currently    Birth control/protection: Surgical    Comment: hyst  Other Topics Concern   Not on file  Social History Narrative   Not on file   Social Drivers of Health   Financial Resource Strain: Low Risk  (10/05/2020)   Overall Financial Resource Strain (CARDIA)    Difficulty of Paying Living Expenses: Not very hard  Food Insecurity: No Food Insecurity (10/05/2020)   Hunger Vital Sign    Worried About  Running Out of Food in the Last Year: Never true    Ran Out of Food in the Last Year: Never true  Transportation Needs: No Transportation Needs (10/05/2020)   PRAPARE - Administrator, Civil Service (Medical): No    Lack of Transportation (Non-Medical): No  Physical Activity: Insufficiently Active (10/05/2020)   Exercise Vital Sign    Days of Exercise per Week: 5 days    Minutes of Exercise per Session: 20 min  Stress: Stress Concern Present (10/05/2020)   [employer] of Occupational Health - Occupational Stress Questionnaire    Feeling of Stress : To some extent  Social Connections: Moderately Integrated (10/05/2020)   Social Connection and Isolation Panel    Frequency of Communication with Friends and Family: Twice a week    Frequency of Social Gatherings with Friends and Family: Twice a week    Attends Religious Services: More than 4 times per year    Active Member of [employer] or Organizations: Yes    Attends Engineer, structural: More than 4 times per year    Marital Status: Divorced     FAMILY HISTORY:  We obtained a detailed, 4-generation family history.  Significant diagnoses are listed below: Family History  Problem Relation Age of Onset   Other Mother 63       Smoldering myeloma   Prostate cancer Brother 65   Prostate cancer Brother 91   Prostate cancer Maternal Uncle 50   Ovarian cancer Paternal Aunt    Breast cancer Paternal Aunt 77   Breast cancer Niece 60       bilateral   Lung cancer Paternal Cousin        smoked, first cousin   Breast cancer Paternal Cousin 22       first cousin once removed   Prostate cancer Maternal Cousin 68       first cousin once removed   Ms. Consolo is unaware of previous family history of genetic testing for hereditary cancer risks. There is no reported Ashkenazi Jewish ancestry.       GENETIC COUNSELING ASSESSMENT: Ms. General is a 63 y.o. female with a personal and family history of cancer which is somewhat  suggestive of a hereditary cancer syndrome and predisposition to cancer given her young age at diagnosis and family history of breast, ovarian, and prostate cancer. We, therefore, discussed and recommended the following at today's visit.   DISCUSSION: We discussed that 5 - 10% of cancer is hereditary, with most cases of hereditary breast cancer associated with mutations in BRCA1/2.  There are other genes that can be associated with hereditary breast cancer syndromes. Type of cancer risk and level of risk are gene-specific. We discussed that testing is beneficial for several  reasons including knowing how to follow individuals after completing their treatment, identifying whether potential treatment options would be beneficial, and understanding if other family members could be at risk for cancer and allowing them to undergo genetic testing.   We reviewed the characteristics, features and inheritance patterns of hereditary cancer syndromes. We also discussed genetic testing, including the appropriate family members to test, the process of testing, insurance coverage and turn-around-time for results. We discussed the implications of a negative, positive and/or variant of uncertain significant result. We recommended Ms. Dooling pursue genetic testing for a panel that contains genes associated with cancer.  Ms. Weier was offered a common hereditary cancer panel (40 genes) and an expanded pan-cancer panel (77 genes). Ms. Kinn was informed of the benefits and limitations of each panel, including that expanded pan-cancer panels contain genes that do not have clear management guidelines at this point in time.  We also discussed that as the number of genes included on a panel increases, the chances of variants of uncertain significance increases.  After considering the benefits and limitations of each gene panel, Ms. Muehl elected to have [facility] CancerNext-Expanded Panel.  The CancerNext-Expanded gene panel  offered by [facility] and includes sequencing, rearrangement, and RNA analysis for the following 77 genes: AIP, ALK, APC, ATM, AXIN2, BAP1, BARD1, BMPR1A, BRCA1, BRCA2, BRIP1, CDC73, CDH1, CDK4, CDKN1B, CDKN2A, CEBPA, CHEK2, CTNNA1, DDX41, DICER1, ETV6, FH, FLCN, GATA2, LZTR1, MAX, MBD4, MEN1, MET, MLH1, MSH2, MSH3, MSH6, MUTYH, NF1, NF2, NTHL1, PALB2, PHOX2B, PMS2, POT1, PRKAR1A, PTCH1, PTEN, RAD51C, RAD51D, RB1, RET, RPS20, RUNX1, SDHA, SDHAF2, SDHB, SDHC, SDHD, SMAD4, SMARCA4, SMARCB1, SMARCE1, STK11, SUFU, TMEM127, TP53, TSC1, TSC2, VHL, and WT1 (sequencing and deletion/duplication); EGFR, HOXB13, KIT, MITF, PDGFRA, POLD1, and POLE (sequencing only); EPCAM and GREM1 (deletion/duplication only).   Based on Ms. Cenci's personal and family history of cancer, she meets medical criteria for genetic testing. We reviewed that we would not expect an out of pocket cost with her current insurance, Medicaid.   PLAN: After considering the risks, benefits, and limitations, Ms. Didio provided informed consent to pursue genetic testing and the blood sample was sent to [facility] for analysis of the CancerNext-Expanded Panel. Results should be available within approximately 2-3 weeks' time, at which point they will be disclosed by telephone to Ms. Ricciardi, as will any additional recommendations warranted by these results. Ms. Rosman will receive a summary of her genetic counseling visit and a copy of her results once available. This information will also be available in Epic.   Ms. Valbuena questions were answered to her satisfaction today. Our contact information was provided should additional questions or concerns arise. Thank you for the referral and allowing us  to share in the care of your patient.   Maurion Walkowiak, MS, [facility] Genetic Counselor [email or web address] (P) [phone]  40 minutes were spent on the date of the encounter in service to the patient including preparation,  face-to-face consultation, documentation and care coordination. The patient was seen alone.  Drs. Gudena and/or Lanny were available to discuss this case as needed.  _______________________________________________________________________ For Office Staff:  Number of people involved in session: 1 Was an Intern/ student involved with case: no

## 2023-11-07 NOTE — Research (Signed)
 [employer] 2021-05 - Specimen Collection Study to Evaluate Biomarkers in Subjects with Cancer     Patient [name] was identified by Dr. Loretha as a potential candidate for the above listed study.  This Clinical Research Coordinator met with [name], [id number], on 11/07/23 in a manner and location that ensures patient privacy to discuss participation in the above listed research study.  Patient is Unaccompanied.  A copy of the informed consent document with embedded HIPAA language was provided to the patient.  Patient reads, speaks, and understands [place].   Patient was provided with the business card of this Coordinator and encouraged to contact the research team with any questions.  Approximately 15 minutes were spent with the patient reviewing the informed consent documents.  Patient was provided the option of taking informed consent documents home to review and was encouraged to review at their convenience with their support network, including other care providers. Patient is comfortable with making a decision regarding study participation today. Patient declined study participation. Patient mentioned physical difficulty with having blood drawn. Dr. Loretha notified.  Laury Quale, MPH  Clinical Research Coordinator

## 2023-11-07 NOTE — Progress Notes (Signed)
 [facility] Clinical Social Work  Initial Assessment   [name] is a 63 y.o. year old female presenting alone. Clinical Social Work was referred by [facility] for assessment of psychosocial needs.   SDOH (Social Determinants of Health) assessments performed: Yes SDOH Interventions    Flowsheet Row Clinical Support from 11/07/2023 in [facility] Of [place]. [facility] Office Visit from 09/24/2023 in [facility]  SDOH Interventions    Food Insecurity Interventions Intervention Not Indicated --  Housing Interventions Intervention Not Indicated --  Transportation Interventions Intervention Not Indicated --  Utilities Interventions Intervention Not Indicated --  Depression Interventions/Treatment  -- EYV7-0 Score <4 Follow-up Not Indicated    SDOH Screenings   Food Insecurity: No Food Insecurity (11/07/2023)  Housing: Low Risk  (11/07/2023)  Transportation Needs: No Transportation Needs (11/07/2023)  Utilities: Not At Risk (11/07/2023)  Alcohol Screen: Low Risk  (10/05/2020)  Depression (PHQ2-9): Low Risk  (11/07/2023)  Financial Resource Strain: Low Risk  (10/05/2020)  Physical Activity: Insufficiently Active (10/05/2020)  Social Connections: Moderately Integrated (10/05/2020)  Stress: Stress Concern Present (10/05/2020)  Tobacco Use: Medium Risk (11/07/2023)    PHQ 2/9:    11/07/2023    2:25 PM 09/24/2023    8:41 AM 08/03/2023    8:17 AM  Depression screen PHQ 2/9  Decreased Interest 0 0 0  Down, Depressed, Hopeless 0 0 0  PHQ - 2 Score 0 0 0  Altered sleeping  1 0  Tired, decreased energy  1 0  Change in appetite  0 0  Feeling bad or failure about yourself   0 0  Trouble concentrating  0 0  Moving slowly or fidgety/restless  0 0  Suicidal thoughts  0 0  PHQ-9 Score  2 0  Difficult doing work/chores  Somewhat difficult      Distress Screen completed: No     No data to display            Family/Social Information:   Housing Arrangement: patient lives with her dad (94yo) and brother Family members/support persons in your life? Family, Friends, and [employer] Financial concerns: no  Employment: Retired. 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 yes, pt has had breast cancer in 2009. She is choosing now, with the new diagnosis, to have a bilateral mastectomy and is at peace with this decision Concerns about diagnosis and/or treatment: I'm not especially worried about anything Patient reported stressors: Adjusting to my illness Hopes and/or priorities: hopes to get through treatment as smoothly as possible and back to living a full life and taking time to take care of herself Patient enjoys time with family/ friends and church, and exercise (kickboxing, water  aerobics, walking) Current coping skills/ strengths: Ability for insight , Capable of independent living , Manufacturing systems engineer , Motivation for treatment/growth , Religious Affiliation , Special hobby/interest , and Supportive family/friends     SUMMARY: Current SDOH Barriers:  No major barriers identified today  Clinical Social Work Clinical Goal(s):  No clinical social work goals at this time  Interventions: Discussed common feeling and emotions when being diagnosed with cancer, and the importance of  support during treatment Informed patient of the support team roles and support services at [facility] Provided CSW contact information and encouraged patient to call with any questions or concerns   Follow Up Plan: Patient will contact CSW with any support or resource needs Patient verbalizes understanding  of plan: Yes    Aikam Vinje E Cheyanna Strick, LCSW Clinical Social Worker [facility]  Patient is participating in a Managed Medicaid Plan:  Yes

## 2023-11-11 ENCOUNTER — Encounter: Payer: Self-pay | Admitting: Nurse Practitioner

## 2023-11-13 ENCOUNTER — Other Ambulatory Visit: Payer: Self-pay | Admitting: Nurse Practitioner

## 2023-11-13 DIAGNOSIS — R7303 Prediabetes: Secondary | ICD-10-CM

## 2023-11-13 MED ORDER — TIRZEPATIDE-WEIGHT MANAGEMENT 12.5 MG/0.5ML ~~LOC~~ SOAJ: 12.5000 mg | SUBCUTANEOUS | 5 refills | Status: DC

## 2023-11-15 ENCOUNTER — Telehealth: Payer: Self-pay | Admitting: *Deleted

## 2023-11-15 ENCOUNTER — Encounter: Payer: Self-pay | Admitting: *Deleted

## 2023-11-15 NOTE — Telephone Encounter (Signed)
 Called patient for [facility] follow up. Left message that if she had any needs or concerns to please call navigator back.

## 2023-11-16 ENCOUNTER — Ambulatory Visit: Payer: Self-pay | Admitting: Genetic Counselor

## 2023-11-16 ENCOUNTER — Telehealth: Payer: Self-pay | Admitting: Genetic Counselor

## 2023-11-16 ENCOUNTER — Encounter: Payer: Self-pay | Admitting: Allergy & Immunology

## 2023-11-16 ENCOUNTER — Ambulatory Visit: Payer: Self-pay

## 2023-11-16 DIAGNOSIS — Z1379 Encounter for other screening for genetic and chromosomal anomalies: Secondary | ICD-10-CM

## 2023-11-16 NOTE — Telephone Encounter (Signed)
 I spoke to [name] to review results of genetic testing. she had genetic testing with [facility]'s CancerNext-Expanded +RNAinsight panel. 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 We discussed that we do not use VUS to make management decisions or identify at risk family members. Discussed that we do not know why she has cancer or why there is cancer in the family. It could be due to a different gene that we are not testing, or maybe our current technology may not be able to pick something up.  It will be important for her to keep in contact with genetics to keep up with whether additional testing may be needed. We will contact her if new information is learned about the VUS.   Please see counseling note for further detail on this result.

## 2023-11-16 NOTE — Telephone Encounter (Signed)
 LVM asking for call back to review results of genetic testing.

## 2023-11-19 ENCOUNTER — Encounter: Payer: Self-pay | Admitting: Physical Therapy

## 2023-11-19 DIAGNOSIS — Z1379 Encounter for other screening for genetic and chromosomal anomalies: Secondary | ICD-10-CM | POA: Insufficient documentation

## 2023-11-19 NOTE — Progress Notes (Signed)
 HPI:  Ms. Roylance was previously seen in the [facility] due to a personal and family history of cancer and concerns regarding a hereditary predisposition to cancer. Please refer to our prior cancer genetics clinic note for more information regarding our discussion, assessment and recommendations, at the time. Ms. Wanless recent genetic test results were disclosed to her by phone on 11/16/23, as were recommendations warranted by these results. These results and recommendations are discussed in more detail below.  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) (Resolved)  09/25/2007 Surgery   Left breast lumpectomy: DCIS ER/PR positive   11/04/2007 - 12/17/2007 Radiation Therapy   Adjuvant radiation therapy   03/26/2008 - 03/27/2013 Anti-estrogen oral therapy   Tamoxifen 20 mg daily   Malignant neoplasm of lower-inner quadrant of left breast in female, estrogen receptor positive (HCC)  11/05/2023 Initial Diagnosis   Malignant neoplasm of lower-inner quadrant of left breast in female, estrogen receptor positive (HCC)   11/07/2023 Cancer Staging   Staging form: Breast, AJCC 8th Edition - Clinical stage from 11/07/2023: Stage IA (cT1c, cN0, cM0, G2, ER+, PR+, HER2-) - Signed by Loretha Ash, MD on 11/07/2023 Stage prefix: Initial diagnosis Histologic grading system: 3 grade system Laterality: Left Staged by: Pathologist and managing physician Stage used in treatment planning: Yes National guidelines used in treatment planning: Yes Type of national guideline used in treatment planning: NCCN    Genetic Testing   Negative CancerNext-Expanded +RNAinsight. VUS in [facility]LE. The CancerNext-Expanded gene panel offered by [facility] and includes sequencing, rearrangement, and RNA analysis for the following 77 genes: AIP, ALK, APC, ATM, AXIN2, BAP1, BARD1, BMPR1A, BRCA1, BRCA2, BRIP1, CDC73, CDH1, CDK4, CDKN1B, CDKN2A, CEBPA,  CHEK2, CTNNA1, DDX41, DICER1, EGFR, EPCAM, ETV6, FH, FLCN, GATA2, GREM1, HOXB13, KIT, LZTR1, MAX, MBD4, MEN1, MET, MITF, MLH1, MSH2, MSH3, MSH6, MUTYH, NF1, NF2, NTHL1, PALB2, PDGFRA, PHOX2B, PMS2, POLD1, POLE, POT1, PRKAR1A, PTCH1, PTEN, RAD51C, RAD51D, RB1, RET, RPS20, RUNX1, SDHA, SDHAF2, SDHB, SDHC, SDHD, SMAD4, SMARCA4, SMARCB1, SMARCE1, STK11, SUFU, TMEM127, TP53, TSC1, TSC2, VHL, WT1. Report date 11/15/23.      FAMILY HISTORY:  We obtained a detailed, 4-generation family history.  Significant diagnoses are listed below: Family History  Problem Relation Age of Onset   Diabetes Mother    Stroke Mother    Hypertension Mother    Other Mother 33       Smoldering myeloma   Macular degeneration Mother    Congestive Heart Failure Mother    Diabetes Father    Hypertension Sister    Hypertension Brother    Prostate cancer Brother 81   Prostate cancer Brother 72   Prostate cancer Maternal Uncle 7   Ovarian cancer Paternal Aunt    Breast cancer Paternal Aunt 33   Breast cancer Niece 88       bilateral   Lung cancer Paternal Cousin        smoked, first cousin   Breast cancer Paternal Cousin 82       first cousin once removed   Prostate cancer Maternal Cousin 64       first cousin once removed      Ms. Balch is unaware of previous family history of genetic testing for hereditary cancer risks. There is no reported Ashkenazi Jewish ancestry.        GENETIC TEST RESULTS: Genetic testing reported out on 11/15/23 through the [facility] CancerNext-Expanded +RNAinsight cancer panel found no pathogenic mutations. The CancerNext-Expanded  gene panel offered by [facility] and includes sequencing, rearrangement, and RNA analysis for the following 77 genes: AIP, ALK, APC, ATM, AXIN2, BAP1, BARD1, BMPR1A, BRCA1, BRCA2, BRIP1, CDC73, CDH1, CDK4, CDKN1B, CDKN2A, CEBPA, CHEK2, CTNNA1, DDX41, DICER1, EGFR, EPCAM, ETV6, FH, FLCN, GATA2, GREM1, HOXB13, KIT, LZTR1, MAX, MBD4, MEN1, MET, MITF, MLH1, MSH2,  MSH3, MSH6, MUTYH, NF1, NF2, NTHL1, PALB2, PDGFRA, PHOX2B, PMS2, POLD1, POLE, POT1, PRKAR1A, PTCH1, PTEN, RAD51C, RAD51D, RB1, RET, RPS20, RUNX1, SDHA, SDHAF2, SDHB, SDHC, SDHD, SMAD4, SMARCA4, SMARCB1, SMARCE1, STK11, SUFU, TMEM127, TP53, TSC1, TSC2, VHL, WT1. The test report has been scanned into EPIC and is located under the Molecular Pathology section of the Results Review tab.  A portion of the result report is included below for reference.     We discussed with Ms. Accomando that because current genetic testing is not perfect, it is possible there may be a gene mutation in one of these genes that current testing cannot detect, but that chance is small.  We also discussed, that there could be another gene that has not yet been discovered, or that we have not yet tested, that is responsible for the cancer diagnoses in the family. It is also possible there is a hereditary cause for the cancer in the family that Ms. Mulvey did not inherit and therefore was not identified in her testing.  Therefore, it is important to remain in touch with cancer genetics in the future so that we can continue to offer Ms. Queenan the most up to date genetic testing.   Genetic testing did identify a variant of uncertain significance (VUS) was identified in the POLE gene called c.449G>A (p.R150Q). At this time, it is unknown if this variant is associated with increased cancer risk or if this is a normal finding, but most variants such as this get reclassified to being inconsequential. It should not be used to make medical management decisions. With time, we suspect the lab will determine the significance of this variant, if any. If we do learn more about it, we will try to contact Ms. Haskett to discuss it further. However, it is important to stay in touch with us  periodically and keep the address and phone number up to date.  ADDITIONAL GENETIC TESTING: We discussed with Ms. Whitwell that her genetic testing was fairly  extensive.  If there are genes identified to increase cancer risk that can be analyzed in the future, we would be happy to discuss and coordinate this testing at that time.    CANCER SCREENING RECOMMENDATIONS: Ms. Storey test result is considered negative (normal).  This means that we have not identified a hereditary cause for her personal and family history of cancer at this time. Most cancers happen by chance and this negative test suggests that her personal and family history of cancer may fall into this category.    Possible reasons for Ms. Slager's negative genetic test include:  1. There may be a gene mutation in one of these genes that current testing methods cannot detect but that chance is small.  2. There could be another gene that has not yet been discovered, or that we have not yet tested, that is responsible for the cancer diagnoses in the family.  3.  There may be no hereditary risk for cancer in the family. The cancers in Ms. Drenning and/or her family may be sporadic/familial or due to other genetic and environmental factors. 4. It is also possible there is a hereditary cause for the cancer in  the family that Ms. Motta did not inherit.  Therefore, it is recommended she continue to follow the cancer management and screening guidelines provided by her oncology and primary healthcare provider. An individual's cancer risk and medical management are not determined by genetic test results alone. Overall cancer risk assessment incorporates additional factors, including personal medical history, family history, and any available genetic information that may result in a personalized plan for cancer prevention and surveillance  Given Ms. Caradine's personal and family histories, we must interpret these negative results with some caution.  Families with features suggestive of hereditary risk for cancer tend to have multiple family members with cancer, diagnoses in multiple generations and  diagnoses before the age of 39. Ms. Melder family exhibits some of these features. Thus, this result may simply reflect our current inability to detect all mutations within these genes or there may be a different gene that has not yet been discovered or tested.  SABRA  RECOMMENDATIONS FOR FAMILY MEMBERS:  Individuals in this family might be at some increased risk of developing cancer, over the general population risk, simply due to the family history of cancer.  We recommended women in this family have a yearly mammogram beginning at age 56, or 50 years younger than the earliest onset of cancer, an annual clinical breast exam, and perform monthly breast self-exams. Women in this family should also have a gynecological exam as recommended by their primary provider. All family members should be referred for colonoscopy starting at age 54, or 28 years younger than the earliest onset of cancer.  FOLLOW-UP: Lastly, we discussed with Ms. Ryland that cancer genetics is a rapidly advancing field and it is possible that new genetic tests will be appropriate for her and/or her family members in the future. We encouraged her to remain in contact with cancer genetics on an annual basis so we can update her personal and family histories and let her know of advances in cancer genetics that may benefit this family.   Our contact number was provided. Ms. Brissette questions were answered to her satisfaction, and she knows she is welcome to call us  at anytime with additional questions or concerns.   Burnard Ogren, MS, [facility] Licensed, Retail [email or web address] [phone]

## 2023-11-21 ENCOUNTER — Encounter (HOSPITAL_BASED_OUTPATIENT_CLINIC_OR_DEPARTMENT_OTHER): Payer: Self-pay | Admitting: Surgery

## 2023-11-21 ENCOUNTER — Other Ambulatory Visit: Payer: Self-pay

## 2023-11-23 ENCOUNTER — Encounter (HOSPITAL_BASED_OUTPATIENT_CLINIC_OR_DEPARTMENT_OTHER)
Admission: RE | Admit: 2023-11-23 | Discharge: 2023-11-23 | Disposition: A | Discharge status: Home / Self Care | Source: Ambulatory Visit | Attending: Surgery | Admitting: Surgery

## 2023-11-23 DIAGNOSIS — Z01818 Encounter for other preprocedural examination: Secondary | ICD-10-CM | POA: Diagnosis present

## 2023-11-23 NOTE — Progress Notes (Signed)
 Provided patient with presurgical drink, soap and instructions. Patient verbalized understanding.

## 2023-11-24 NOTE — Anesthesia Preprocedure Evaluation (Addendum)
 Anesthesia Evaluation  Patient identified by MRN, date of birth, ID band Patient awake    Reviewed: Allergy & Precautions, NPO status , Patient's Chart, lab work & pertinent test results  History of Anesthesia Complications Negative for: history of anesthetic complications  Airway Mallampati: II  TM Distance: >3 FB Neck ROM: Full    Dental  (+) Partial Upper, Dental Advisory Given   Pulmonary neg shortness of breath, asthma (no recent flares, gets asthma shots, no recent inhaler use) , neg sleep apnea, neg COPD, neg recent URI, former smoker   Pulmonary exam normal breath sounds clear to auscultation       Cardiovascular hypertension, Pt. on medications (-) angina (-) Past MI, (-) Cardiac Stents and (-) CABG (-) dysrhythmias  Rhythm:Regular Rate:Normal     Neuro/Psych  PSYCHIATRIC DISORDERS Anxiety Depression    negative neurological ROS     GI/Hepatic negative GI ROS, Neg liver ROS,,,  Endo/Other  BMI 37  Renal/GU negative Renal ROS  negative genitourinary   Musculoskeletal negative musculoskeletal ROS (+)    Abdominal  (+) + obese  Peds  Hematology negative hematology ROS (+)   Anesthesia Other Findings Zepbound : last dose 11/18/2023  Reproductive/Obstetrics negative OB ROS                              Anesthesia Physical Anesthesia Plan  ASA: 2  Anesthesia Plan: General and Regional   Post-op Pain Management: Regional block* and Tylenol  PO (pre-op)*   Induction: Intravenous  PONV Risk Score and Plan: 3 and Ondansetron , Dexamethasone , Midazolam  and Treatment may vary due to age or medical condition  Airway Management Planned: LMA  Additional Equipment: None  Intra-op Plan:   Post-operative Plan: Extubation in OR  Informed Consent: I have reviewed the patients History and Physical, chart, labs and discussed the procedure including the risks, benefits and alternatives for the  proposed anesthesia with the patient or authorized representative who has indicated his/her understanding and acceptance.     Dental advisory given  Plan Discussed with: CRNA and Anesthesiologist  Anesthesia Plan Comments: (Discussed potential risks of nerve blocks including, but not limited to, infection, bleeding, nerve damage, seizures, pneumothorax, respiratory depression, and potential failure of the block. Alternatives to nerve blocks discussed. All questions answered.  Risks of general anesthesia discussed including, but not limited to, sore throat, hoarse voice, chipped/damaged teeth, injury to vocal cords, nausea and vomiting, allergic reactions, lung infection, heart attack, stroke, and death. All questions answered. )         Anesthesia Quick Evaluation

## 2023-11-28 ENCOUNTER — Ambulatory Visit (INDEPENDENT_AMBULATORY_CARE_PROVIDER_SITE_OTHER): Payer: Self-pay

## 2023-11-28 ENCOUNTER — Telehealth: Payer: Self-pay | Admitting: *Deleted

## 2023-11-28 DIAGNOSIS — J309 Allergic rhinitis, unspecified: Secondary | ICD-10-CM | POA: Diagnosis not present

## 2023-11-28 NOTE — Telephone Encounter (Signed)
 Received message from patient regarding needing a hospital bed after her surgery tomorrow because her bedroom is upstairs. She was under the impression that she would need to be immobile for a period of time and not be able to walk up the stairs.  Informed her that she should be moving around after surgery to prevent blood clots and that it would be ok to walk up stairs. Told her she may want to add extra pillows to make it more comfortable for her.  She verbalized understanding.

## 2023-11-29 ENCOUNTER — Encounter (HOSPITAL_BASED_OUTPATIENT_CLINIC_OR_DEPARTMENT_OTHER): Payer: Self-pay | Admitting: Surgery

## 2023-11-29 ENCOUNTER — Ambulatory Visit (HOSPITAL_BASED_OUTPATIENT_CLINIC_OR_DEPARTMENT_OTHER): Payer: Self-pay | Admitting: Anesthesiology

## 2023-11-29 ENCOUNTER — Encounter (HOSPITAL_BASED_OUTPATIENT_CLINIC_OR_DEPARTMENT_OTHER)
Admission: RE | Disposition: A | Discharge status: Home / Self Care | Payer: Self-pay | Source: Home / Self Care | Attending: Surgery

## 2023-11-29 ENCOUNTER — Other Ambulatory Visit: Payer: Self-pay

## 2023-11-29 ENCOUNTER — Ambulatory Visit (HOSPITAL_BASED_OUTPATIENT_CLINIC_OR_DEPARTMENT_OTHER)
Admission: RE | Admit: 2023-11-29 | Discharge: 2023-11-30 | Disposition: A | Discharge status: Home / Self Care | Attending: Surgery | Admitting: Surgery

## 2023-11-29 DIAGNOSIS — Z803 Family history of malignant neoplasm of breast: Secondary | ICD-10-CM | POA: Diagnosis not present

## 2023-11-29 DIAGNOSIS — Z79899 Other long term (current) drug therapy: Secondary | ICD-10-CM | POA: Diagnosis not present

## 2023-11-29 DIAGNOSIS — F419 Anxiety disorder, unspecified: Secondary | ICD-10-CM | POA: Insufficient documentation

## 2023-11-29 DIAGNOSIS — Z170 Estrogen receptor positive status [ER+]: Secondary | ICD-10-CM | POA: Diagnosis not present

## 2023-11-29 DIAGNOSIS — N6031 Fibrosclerosis of right breast: Secondary | ICD-10-CM | POA: Insufficient documentation

## 2023-11-29 DIAGNOSIS — Z1732 Human epidermal growth factor receptor 2 negative status: Secondary | ICD-10-CM | POA: Diagnosis not present

## 2023-11-29 DIAGNOSIS — Z1721 Progesterone receptor positive status: Secondary | ICD-10-CM | POA: Insufficient documentation

## 2023-11-29 DIAGNOSIS — C50412 Malignant neoplasm of upper-outer quadrant of left female breast: Secondary | ICD-10-CM | POA: Diagnosis present

## 2023-11-29 DIAGNOSIS — Z923 Personal history of irradiation: Secondary | ICD-10-CM | POA: Insufficient documentation

## 2023-11-29 DIAGNOSIS — F32A Depression, unspecified: Secondary | ICD-10-CM | POA: Diagnosis not present

## 2023-11-29 DIAGNOSIS — J45909 Unspecified asthma, uncomplicated: Secondary | ICD-10-CM | POA: Insufficient documentation

## 2023-11-29 DIAGNOSIS — Z87891 Personal history of nicotine dependence: Secondary | ICD-10-CM | POA: Diagnosis not present

## 2023-11-29 DIAGNOSIS — I10 Essential (primary) hypertension: Secondary | ICD-10-CM | POA: Insufficient documentation

## 2023-11-29 HISTORY — PX: TOTAL MASTECTOMY: SHX6129

## 2023-11-29 HISTORY — PX: MASTECTOMY W/ SENTINEL NODE BIOPSY: SHX2001

## 2023-11-29 HISTORY — DX: Prediabetes: R73.03

## 2023-11-29 HISTORY — DX: Unspecified asthma, uncomplicated: J45.909

## 2023-11-29 SURGERY — MASTECTOMY WITH SENTINEL LYMPH NODE BIOPSY
Anesthesia: Regional | Site: Breast | Laterality: Right

## 2023-11-29 MED ORDER — ACETAMINOPHEN 500 MG PO TABS
1000.0000 mg | ORAL_TABLET | Freq: Once | ORAL | Status: AC
  Administered 2023-11-29: 1000 mg via ORAL

## 2023-11-29 MED ORDER — ACETAMINOPHEN 325 MG PO TABS: 650.0000 mg | ORAL_TABLET | ORAL | Status: DC | PRN

## 2023-11-29 MED ORDER — PHENYLEPHRINE 80 MCG/ML (10ML) SYRINGE FOR IV PUSH (FOR BLOOD PRESSURE SUPPORT)
PREFILLED_SYRINGE | INTRAVENOUS | Status: AC
  Filled 2023-11-29: qty 10

## 2023-11-29 MED ORDER — OXYCODONE HCL 5 MG PO TABS: 5.0000 mg | ORAL_TABLET | Freq: Four times a day (QID) | ORAL | 0 refills | Status: DC | PRN

## 2023-11-29 MED ORDER — IBUPROFEN 800 MG PO TABS: 800.0000 mg | ORAL_TABLET | Freq: Three times a day (TID) | ORAL | 0 refills | Status: DC | PRN

## 2023-11-29 MED ORDER — DEXAMETHASONE SODIUM PHOSPHATE 10 MG/ML IJ SOLN
INTRAMUSCULAR | Status: AC
Start: 2023-11-29 — End: 2023-11-29
  Filled 2023-11-29: qty 1

## 2023-11-29 MED ORDER — OXYCODONE HCL 5 MG PO TABS
5.0000 mg | ORAL_TABLET | ORAL | Status: DC | PRN
  Administered 2023-11-29: 10 mg via ORAL
  Administered 2023-11-29: 5 mg via ORAL
  Filled 2023-11-29 (×2): qty 2

## 2023-11-29 MED ORDER — CHLORHEXIDINE GLUCONATE CLOTH 2 % EX PADS: 6.0000 | MEDICATED_PAD | Freq: Once | CUTANEOUS | Status: DC

## 2023-11-29 MED ORDER — PROPOFOL 10 MG/ML IV BOLUS
INTRAVENOUS | Status: DC | PRN
  Administered 2023-11-29: 200 mg via INTRAVENOUS
  Administered 2023-11-29: 50 mg via INTRAVENOUS
  Administered 2023-11-29 (×2): 100 mg via INTRAVENOUS

## 2023-11-29 MED ORDER — OXYCODONE HCL 5 MG/5ML PO SOLN: 5.0000 mg | Freq: Once | ORAL | Status: DC | PRN

## 2023-11-29 MED ORDER — SODIUM CHLORIDE 0.9% FLUSH
3.0000 mL | INTRAVENOUS | Status: DC | PRN
  Administered 2023-11-30: 3 mL via INTRAVENOUS

## 2023-11-29 MED ORDER — TRANEXAMIC ACID 1000 MG/10ML IV SOLN
Status: DC | PRN
  Administered 2023-11-29: 6000 mg via TOPICAL

## 2023-11-29 MED ORDER — MIDAZOLAM HCL 2 MG/2ML IJ SOLN
INTRAMUSCULAR | Status: AC
  Filled 2023-11-29: qty 2

## 2023-11-29 MED ORDER — 0.9 % SODIUM CHLORIDE (POUR BTL) OPTIME
TOPICAL | Status: DC | PRN
Start: 2023-11-29 — End: 2023-11-29
  Administered 2023-11-29: 2000 mL

## 2023-11-29 MED ORDER — ONDANSETRON HCL 4 MG/2ML IJ SOLN: 4.0000 mg | Freq: Once | INTRAMUSCULAR | Status: DC | PRN

## 2023-11-29 MED ORDER — EPHEDRINE SULFATE (PRESSORS) 50 MG/ML IJ SOLN
INTRAMUSCULAR | Status: DC | PRN
  Administered 2023-11-29 (×2): 10 mg via INTRAVENOUS

## 2023-11-29 MED ORDER — KETOROLAC TROMETHAMINE 15 MG/ML IJ SOLN
15.0000 mg | Freq: Four times a day (QID) | INTRAMUSCULAR | Status: DC
  Administered 2023-11-29 – 2023-11-30 (×4): 15 mg via INTRAVENOUS
  Filled 2023-11-29 (×4): qty 1

## 2023-11-29 MED ORDER — EPHEDRINE 5 MG/ML INJ
INTRAVENOUS | Status: AC
  Filled 2023-11-29: qty 5

## 2023-11-29 MED ORDER — CLINDAMYCIN PHOSPHATE 900 MG/50ML IV SOLN
INTRAVENOUS | Status: AC
Start: 2023-11-29 — End: 2023-11-29
  Filled 2023-11-29: qty 50

## 2023-11-29 MED ORDER — TRANEXAMIC ACID 1000 MG/10ML IV SOLN
INTRAVENOUS | Status: AC
  Filled 2023-11-29: qty 30

## 2023-11-29 MED ORDER — FENTANYL CITRATE (PF) 100 MCG/2ML IJ SOLN
INTRAMUSCULAR | Status: AC
  Filled 2023-11-29: qty 2

## 2023-11-29 MED ORDER — FENTANYL CITRATE (PF) 100 MCG/2ML IJ SOLN
INTRAMUSCULAR | Status: DC | PRN
  Administered 2023-11-29 (×4): 50 ug via INTRAVENOUS

## 2023-11-29 MED ORDER — SUCCINYLCHOLINE CHLORIDE 200 MG/10ML IV SOSY
PREFILLED_SYRINGE | INTRAVENOUS | Status: AC
  Filled 2023-11-29: qty 10

## 2023-11-29 MED ORDER — HYDROCHLOROTHIAZIDE 50 MG PO TABS
50.0000 mg | ORAL_TABLET | Freq: Every day | ORAL | Status: DC
Start: 2023-11-29 — End: 2023-11-30
  Administered 2023-11-29: 50 mg via ORAL
  Filled 2023-11-29: qty 1

## 2023-11-29 MED ORDER — ONDANSETRON HCL 4 MG/2ML IJ SOLN
INTRAMUSCULAR | Status: AC
  Filled 2023-11-29: qty 2

## 2023-11-29 MED ORDER — OXYCODONE HCL 5 MG PO TABS: 5.0000 mg | ORAL_TABLET | Freq: Once | ORAL | Status: DC | PRN

## 2023-11-29 MED ORDER — LIDOCAINE 2% (20 MG/ML) 5 ML SYRINGE
INTRAMUSCULAR | Status: AC
Start: 2023-11-29 — End: 2023-11-29
  Filled 2023-11-29: qty 5

## 2023-11-29 MED ORDER — ACETAMINOPHEN 325 MG RE SUPP: 650.0000 mg | RECTAL | Status: DC | PRN

## 2023-11-29 MED ORDER — MORPHINE SULFATE (PF) 4 MG/ML IV SOLN: 1.0000 mg | INTRAVENOUS | Status: DC | PRN

## 2023-11-29 MED ORDER — ENOXAPARIN SODIUM 40 MG/0.4ML IJ SOSY
40.0000 mg | PREFILLED_SYRINGE | INTRAMUSCULAR | Status: DC
  Administered 2023-11-30: 40 mg via SUBCUTANEOUS
  Filled 2023-11-29: qty 0.4

## 2023-11-29 MED ORDER — ONDANSETRON HCL 4 MG/2ML IJ SOLN: 4.0000 mg | Freq: Four times a day (QID) | INTRAMUSCULAR | Status: DC | PRN

## 2023-11-29 MED ORDER — ACETAMINOPHEN 500 MG PO TABS
ORAL_TABLET | ORAL | Status: AC
Start: 2023-11-29 — End: 2023-11-29
  Filled 2023-11-29: qty 2

## 2023-11-29 MED ORDER — SUGAMMADEX SODIUM 200 MG/2ML IV SOLN
INTRAVENOUS | Status: DC | PRN
Start: 2023-11-29 — End: 2023-11-29
  Administered 2023-11-29: 200 mg via INTRAVENOUS

## 2023-11-29 MED ORDER — ROCURONIUM BROMIDE 100 MG/10ML IV SOLN
INTRAVENOUS | Status: DC | PRN
  Administered 2023-11-29 (×2): 20 mg via INTRAVENOUS
  Administered 2023-11-29: 60 mg via INTRAVENOUS

## 2023-11-29 MED ORDER — ROCURONIUM BROMIDE 10 MG/ML (PF) SYRINGE
PREFILLED_SYRINGE | INTRAVENOUS | Status: AC
  Filled 2023-11-29: qty 10

## 2023-11-29 MED ORDER — SODIUM CHLORIDE 0.9 % IV SOLN: 250.0000 mL | INTRAVENOUS | Status: DC | PRN

## 2023-11-29 MED ORDER — AMISULPRIDE (ANTIEMETIC) 5 MG/2ML IV SOLN: 10.0000 mg | Freq: Once | INTRAVENOUS | Status: DC | PRN

## 2023-11-29 MED ORDER — MAGTRACE LYMPHATIC TRACER
INTRAMUSCULAR | Status: DC | PRN
  Administered 2023-11-29: 2 mL via INTRAMUSCULAR

## 2023-11-29 MED ORDER — SODIUM CHLORIDE 0.9% FLUSH
3.0000 mL | Freq: Two times a day (BID) | INTRAVENOUS | Status: DC
  Administered 2023-11-30: 3 mL via INTRAVENOUS

## 2023-11-29 MED ORDER — ONDANSETRON HCL 4 MG/2ML IJ SOLN
INTRAMUSCULAR | Status: DC | PRN
  Administered 2023-11-29: 4 mg via INTRAVENOUS

## 2023-11-29 MED ORDER — GLYCOPYRROLATE PF 0.2 MG/ML IJ SOSY
PREFILLED_SYRINGE | INTRAMUSCULAR | Status: AC
  Filled 2023-11-29: qty 1

## 2023-11-29 MED ORDER — PHENYLEPHRINE HCL (PRESSORS) 10 MG/ML IV SOLN
INTRAVENOUS | Status: DC | PRN
Start: 2023-11-29 — End: 2023-11-29
  Administered 2023-11-29 (×6): 80 ug via INTRAVENOUS

## 2023-11-29 MED ORDER — GLYCOPYRROLATE 0.2 MG/ML IJ SOLN
INTRAMUSCULAR | Status: DC | PRN
  Administered 2023-11-29: .2 mg via INTRAVENOUS

## 2023-11-29 MED ORDER — SUCCINYLCHOLINE CHLORIDE 200 MG/10ML IV SOSY
PREFILLED_SYRINGE | INTRAVENOUS | Status: DC | PRN
  Administered 2023-11-29: 120 mg via INTRAVENOUS

## 2023-11-29 MED ORDER — DEXAMETHASONE SODIUM PHOSPHATE 4 MG/ML IJ SOLN
INTRAMUSCULAR | Status: DC | PRN
  Administered 2023-11-29: 10 mg via INTRAVENOUS

## 2023-11-29 MED ORDER — BUPIVACAINE HCL (PF) 0.25 % IJ SOLN
INTRAMUSCULAR | Status: DC | PRN
  Administered 2023-11-29 (×2): 30 mL via PERINEURAL

## 2023-11-29 MED ORDER — LIDOCAINE HCL (CARDIAC) PF 100 MG/5ML IV SOSY
PREFILLED_SYRINGE | INTRAVENOUS | Status: DC | PRN
  Administered 2023-11-29: 100 mg via INTRAVENOUS

## 2023-11-29 MED ORDER — LACTATED RINGERS IV SOLN
INTRAVENOUS | Status: AC
Start: 2023-11-29 — End: 2023-11-30

## 2023-11-29 MED ORDER — HYDROMORPHONE HCL 1 MG/ML IJ SOLN: 0.2500 mg | INTRAMUSCULAR | Status: DC | PRN

## 2023-11-29 MED ORDER — VASOPRESSIN 20 UNIT/ML IV SOLN
INTRAVENOUS | Status: AC
Start: 2023-11-29 — End: 2023-11-29
  Filled 2023-11-29: qty 1

## 2023-11-29 MED ORDER — CEFAZOLIN SODIUM-DEXTROSE 3-4 GM/150ML-% IV SOLN
3.0000 g | INTRAVENOUS | Status: AC
Start: 2023-11-29 — End: 2023-11-29
  Administered 2023-11-29: 3 g via INTRAVENOUS

## 2023-11-29 MED ORDER — CLINDAMYCIN PHOSPHATE 900 MG/50ML IV SOLN
900.0000 mg | INTRAVENOUS | Status: AC
Start: 2023-11-29 — End: 2023-11-29
  Administered 2023-11-29: 900 mg via INTRAVENOUS

## 2023-11-29 MED ORDER — PROPOFOL 500 MG/50ML IV EMUL
INTRAVENOUS | Status: AC
Start: 2023-11-29 — End: 2023-11-29
  Filled 2023-11-29: qty 50

## 2023-11-29 MED ORDER — METHOCARBAMOL 500 MG PO TABS: 500.0000 mg | ORAL_TABLET | Freq: Three times a day (TID) | ORAL | 0 refills | Status: DC | PRN

## 2023-11-29 MED ORDER — CEFAZOLIN SODIUM-DEXTROSE 2-4 GM/100ML-% IV SOLN
INTRAVENOUS | Status: AC
Start: 2023-11-29 — End: 2023-11-29
  Filled 2023-11-29: qty 100

## 2023-11-29 MED ORDER — METHOCARBAMOL 500 MG PO TABS
500.0000 mg | ORAL_TABLET | Freq: Four times a day (QID) | ORAL | Status: DC | PRN
  Administered 2023-11-29: 500 mg via ORAL
  Filled 2023-11-29: qty 1

## 2023-11-29 SURGICAL SUPPLY — 58 items
BENZOIN TINCTURE PRP APPL 2/3 (GAUZE/BANDAGES/DRESSINGS) IMPLANT
BINDER BREAST LRG (GAUZE/BANDAGES/DRESSINGS) IMPLANT
BINDER BREAST MEDIUM (GAUZE/BANDAGES/DRESSINGS) IMPLANT
BINDER BREAST XLRG (GAUZE/BANDAGES/DRESSINGS) IMPLANT
BINDER BREAST XXLRG (GAUZE/BANDAGES/DRESSINGS) IMPLANT
BIOPATCH RED 1 DISK 7.0 (GAUZE/BANDAGES/DRESSINGS) IMPLANT
BLADE HEX COATED 2.75 (ELECTRODE) ×3 IMPLANT
BLADE SURG 10 STRL SS (BLADE) ×3 IMPLANT
BLADE SURG 15 STRL LF DISP TIS (BLADE) ×3 IMPLANT
CANISTER SUCT 1200ML W/VALVE (MISCELLANEOUS) ×3 IMPLANT
CHLORAPREP W/TINT 26 (MISCELLANEOUS) ×3 IMPLANT
CLIP APPLIE 11 MED OPEN (CLIP) IMPLANT
CLIP APPLIE 9.375 MED OPEN (MISCELLANEOUS) ×3 IMPLANT
COVER BACK TABLE 60X90IN (DRAPES) ×3 IMPLANT
COVER MAYO STAND STRL (DRAPES) ×3 IMPLANT
COVER PROBE CYLINDRICAL 5X96 (MISCELLANEOUS) ×3 IMPLANT
DERMABOND ADVANCED .7 DNX12 (GAUZE/BANDAGES/DRESSINGS) ×6 IMPLANT
DRAIN CHANNEL 19F RND (DRAIN) ×3 IMPLANT
DRAPE LAPAROSCOPIC ABDOMINAL (DRAPES) ×3 IMPLANT
DRAPE UTILITY XL STRL (DRAPES) IMPLANT
DRSG TEGADERM 2-3/8X2-3/4 SM (GAUZE/BANDAGES/DRESSINGS) IMPLANT
DRSG TEGADERM 4X10 (GAUZE/BANDAGES/DRESSINGS) ×3 IMPLANT
ELECTRODE REM PT RTRN 9FT ADLT (ELECTROSURGICAL) ×3 IMPLANT
EVACUATOR SILICONE 100CC (DRAIN) ×3 IMPLANT
GAUZE PAD ABD 8X10 STRL (GAUZE/BANDAGES/DRESSINGS) IMPLANT
GAUZE SPONGE 4X4 12PLY STRL LF (GAUZE/BANDAGES/DRESSINGS) IMPLANT
GLOVE BIOGEL PI IND STRL 6.5 (GLOVE) IMPLANT
GLOVE BIOGEL PI IND STRL 8 (GLOVE) ×3 IMPLANT
GLOVE ECLIPSE 8.0 STRL XLNG CF (GLOVE) ×3 IMPLANT
GOWN STRL REUS W/ TWL LRG LVL3 (GOWN DISPOSABLE) ×6 IMPLANT
GOWN STRL REUS W/ TWL XL LVL3 (GOWN DISPOSABLE) ×3 IMPLANT
LIGHT WAVEGUIDE WIDE FLAT (MISCELLANEOUS) IMPLANT
NDL HYPO 25X1 1.5 SAFETY (NEEDLE) ×6 IMPLANT
NDL SAFETY ECLIPSE 18X1.5 (NEEDLE) ×3 IMPLANT
NEEDLE HYPO 25X1 1.5 SAFETY (NEEDLE) ×4 IMPLANT
NS IRRIG 1000ML POUR BTL (IV SOLUTION) IMPLANT
PACK BASIN DAY SURGERY FS (CUSTOM PROCEDURE TRAY) ×3 IMPLANT
PENCIL SMOKE EVACUATOR (MISCELLANEOUS) ×3 IMPLANT
PIN SAFETY STERILE (MISCELLANEOUS) ×3 IMPLANT
SLEEVE SCD COMPRESS KNEE MED (STOCKING) ×3 IMPLANT
SPIKE FLUID TRANSFER (MISCELLANEOUS) IMPLANT
SPONGE T-LAP 18X18 ~~LOC~~+RFID (SPONGE) ×3 IMPLANT
SPONGE T-LAP 4X18 ~~LOC~~+RFID (SPONGE) IMPLANT
STAPLER SKIN PROX WIDE 3.9 (STAPLE) ×3 IMPLANT
STRIP CLOSURE SKIN 1/2X4 (GAUZE/BANDAGES/DRESSINGS) IMPLANT
SUT CHROMIC 3 0 SH 27 (SUTURE) IMPLANT
SUT ETHILON 2 0 FS 18 (SUTURE) ×3 IMPLANT
SUT MNCRL AB 3-0 PS2 18 (SUTURE) ×3 IMPLANT
SUT MON AB 4-0 PC3 18 (SUTURE) IMPLANT
SUT SILK 2 0 PERMA HAND 18 BK (SUTURE) ×3 IMPLANT
SUT SILK 2 0 SH (SUTURE) IMPLANT
SUT VIC AB 3-0 54X BRD REEL (SUTURE) ×3 IMPLANT
SUT VICRYL 3-0 CR8 SH (SUTURE) ×6 IMPLANT
SYR CONTROL 10ML LL (SYRINGE) ×6 IMPLANT
TOWEL GREEN STERILE FF (TOWEL DISPOSABLE) ×6 IMPLANT
TRACER MAGTRACE VIAL (MISCELLANEOUS) IMPLANT
TUBE CONNECTING 20X1/4 (TUBING) ×3 IMPLANT
YANKAUER SUCT BULB TIP NO VENT (SUCTIONS) ×3 IMPLANT

## 2023-11-29 NOTE — Anesthesia Procedure Notes (Addendum)
 Procedure Name: Intubation Date/Time: 11/29/2023 8:57 AM  Performed by: Pam Macario BROCKS, CRNAPre-anesthesia Checklist: Patient identified, Emergency Drugs available, Suction available, Patient being monitored and Timeout performed Patient Re-evaluated:Patient Re-evaluated prior to induction Oxygen Delivery Method: Circle system utilized Preoxygenation: Pre-oxygenation with 100% oxygen Induction Type: IV induction Ventilation: Two handed mask ventilation required and Oral airway inserted - appropriate to patient size Laryngoscope Size: Mac and 4 Grade View: Grade I Tube type: Oral Tube size: 7.0 mm Number of attempts: 1 Airway Equipment and Method: Stylet and Oral airway Placement Confirmation: ETT inserted through vocal cords under direct vision, positive ETCO2, breath sounds checked- equal and bilateral and CO2 detector Secured at: 21 cm Tube secured with: Tape Dental Injury: Teeth and Oropharynx as per pre-operative assessment  Comments: LMA placed and repositioned - unable to seat well - elective Intubation by Dr Peggye successful BBS

## 2023-11-29 NOTE — H&P (Signed)
 Chief Complaint: Breast Cancer   History of Present Illness: [name] is a 63 y.o. female who is seen today as an office consultation for evaluation of Breast Cancer     The patient presents for evaluation in the multidisciplinary breast clinic today for newly diagnosed left breast cancer.  She has a history of a left breast lumpectomy in 2009 followed radiation therapy for DCIS high-grade ER positive, PR positive.  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  Denies any history of breast pain breast mass or nipple discharge.     Review of Systems: A complete review of systems was obtained from the patient.  I have reviewed this information and discussed as appropriate with the patient.  See HPI as well for other ROS.       Medical History: Past Medical History      Past Medical History:  Diagnosis Date   Anxiety     Asthma, unspecified asthma severity, unspecified whether complicated, unspecified whether persistent (HHS-HCC)     History of cancer     Hyperlipidemia     Hypertension          Problem List     Patient Active Problem List  Diagnosis   Anxiety state   Breast cancer of lower-outer quadrant of left female breast (CMS/HHS-HCC)   DCIS (ductal carcinoma in situ)   Diarrhea   Encounter for screening colonoscopy   Nervously anxious        Past Surgical History       Past Surgical History:  Procedure Laterality Date   ABDOMINAL HYSTERECTOMY N/A 09/10/2005   Breast lumpectomy (Left) Left 09/11/2007   Colonoscopy with propofol  N/A 09/08/2014   Breast biopsy (Right)  Right 10/2020   Breast biopsy (Left) Left 10/26/2023        Allergies      Allergies  Allergen Reactions   Erythromycin Hives and Itching   Amoxicillin Hives   Cefaclor Other (See Comments)   Latex, Natural  Rubber Other (See Comments)   Penicillins Hives and Itching   Pineapple Itching   Tetracyclines Hives        Medications Ordered Prior to Encounter        Current Outpatient Medications on File Prior to Visit  Medication Sig Dispense Refill   AIRSUPRA  90-80 mcg/actuation HFAA Inhale 2 Puffs into the lungs every 4 (four) hours as needed       albuterol  (PROVENTIL ) 2.5 mg /3 mL (0.083 %) nebulizer solution Take 2.5 mg by nebulization every 6 (six) hours as needed for Wheezing or Shortness of Breath       aspirin  81 MG EC tablet Take 81 mg by mouth once daily       clobetasoL  (TEMOVATE ) 0.05 % ointment Apply 1 Application topically 2 (two) times daily       DULoxetine  (CYMBALTA ) 60 MG DR capsule Take 60 mg by mouth once daily       EPINEPHrine  (EPIPEN ) 0.3 mg/0.3 mL auto-injector Inject 0.3 mg into the muscle as needed for Anaphylaxis       hydroCHLOROthiazide  (HYDRODIURIL ) 50 MG tablet Take 50 mg by mouth once daily       hydroCHLOROthiazide  (HYDRODIURIL ) 50 MG tablet Take 50 mg by mouth once daily       LORazepam  (ATIVAN ) 0.5 MG tablet Take  0.5 mg by mouth every 8 (eight) hours as needed for Anxiety       rosuvastatin  (CRESTOR ) 20 MG tablet Take 20 mg by mouth at bedtime       ZEPBOUND  10 mg/0.5 mL pen injector Inject 10 mg subcutaneously once a week        No current facility-administered medications on file prior to visit.        Family History       Family History  Problem Relation Age of Onset   Stroke Mother     Skin cancer Mother     Obesity Mother     High blood pressure (Hypertension) Mother     Hyperlipidemia (Elevated cholesterol) Mother     Diabetes Mother     Obesity Father     High blood pressure (Hypertension) Father     Hyperlipidemia (Elevated cholesterol) Father     Diabetes Father     Obesity Sister     High blood pressure (Hypertension) Sister     Hyperlipidemia (Elevated cholesterol) Sister     Diabetes Sister     High blood pressure (Hypertension)  Brother     Hyperlipidemia (Elevated cholesterol) Brother     Coronary Artery Disease (Blocked arteries around heart) Brother     Diabetes Brother     Deep vein thrombosis (DVT or abnormal blood clot formation) Brother          Tobacco Use History  Social History        Tobacco Use  Smoking Status Former   Types: Cigarettes  Smokeless Tobacco Never        Social History  Social History         Socioeconomic History   Marital status: Divorced  Tobacco Use   Smoking status: Former      Types: Cigarettes   Smokeless tobacco: Never  Vaping Use   Vaping status: Unknown  Substance and Sexual Activity   Drug use: Never        Objective:  There were no vitals filed for this visit.  There is no height or weight on file to calculate BMI.   Physical Exam Exam conducted with a chaperone present [place]).  Cardiovascular:     Rate and Rhythm: Normal rate.  Pulmonary:     Effort: Pulmonary effort is normal.  Chest:  Breasts:    Right: Normal. No mass or tenderness.     Left: No mass or tenderness.        Comments: Scar left breast noted.  Left breast is smaller than right.  Right is large and pendulous. Lymphadenopathy:     Upper Body:     Right upper body: No axillary adenopathy.     Left upper body: No axillary adenopathy.  Skin:    General: Skin is warm.  Neurological:     General: No focal deficit present.     Mental Status: She is alert.           Labs, Imaging and Diagnostic Testing: Mammogram/ultrasound shows a 1.3 cm irregular old left breast mass at 7:00.  Adjacent to previous lumpectomy bed from DCIS excision 2009 with clips.  She has several prominent left axillar lymph nodes.  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   Assessment and Plan:    Assessment[] Expand by Default Diagnoses and all orders for this visit:   Malignant neoplasm of lower-outer quadrant of left breast  of  female, estrogen receptor positive (CMS/HHS-HCC)     Patient with history of DCIS left breast status postlumpectomy alone and radiation therapy 2009.  I reviewed surgical options but given previous radiation therapy she is not a candidate for repeat radiation after seeing radiation oncology today.  She desires bilateral simple mastectomies and is considering reconstruction.  Refer to plastic surgery for evaluation.  Risks and benefits of mastectomy reviewed as well as bleeding, infection, flap necrosis, cosmetic deformity, the need for additional surgeries and/or treatments.  We discussed sentinel lymph node mapping as well with the risk of lymphedema and shoulder stiffness associate with it.  We discussed risk reducing mastectomy as well since she has a history of breast cancer and wishes to proceed with bilateral simple mastectomies.  I reviewed the cosmesis afterwards and the potential skin issues as well as mobility issues of the extremities.

## 2023-11-29 NOTE — Progress Notes (Signed)
 Assisted Dr. Delon Arch with left and right, pectoralis, ultrasound guided blocks. Side rails up, monitors on throughout procedure. See vital signs in flow sheet. Tolerated Procedure well.

## 2023-11-29 NOTE — Anesthesia Postprocedure Evaluation (Signed)
 Anesthesia Post Note  Patient: [name]  Procedure(s) Performed: MASTECTOMY WITH SENTINEL LYMPH NODE BIOPSY (Left: Breast) MASTECTOMY, SIMPLE (Right: Breast)     Patient location during evaluation: PACU Anesthesia Type: Regional and General Level of consciousness: awake Pain management: pain level controlled Vital Signs Assessment: post-procedure vital signs reviewed and stable Respiratory status: spontaneous breathing, nonlabored ventilation and respiratory function stable Cardiovascular status: blood pressure returned to baseline and stable Postop Assessment: no apparent nausea or vomiting Anesthetic complications: no   No notable events documented.  Last Vitals:  Vitals:   11/29/23 1230 11/29/23 1245  BP: (!) 140/70 105/87  Pulse: (!) 104 99  Resp: 16 17  Temp:    SpO2: 100% 96%    Last Pain:  Vitals:   11/29/23 1245  TempSrc:   PainSc: 0-No pain                 Delon Aisha Arch

## 2023-11-29 NOTE — Interval H&P Note (Signed)
 History and Physical Interval Note:  11/29/2023 7:22 AM  [name]  has presented today for surgery, with the diagnosis of BREAST CANCER.  The various methods of treatment have been discussed with the patient and family. After consideration of risks, benefits and other options for treatment, the patient has consented to  Procedure(s) with comments: MASTECTOMY WITH SENTINEL LYMPH NODE BIOPSY (Left) - GEN w/PEC BLOCK LEFT SIMPLE MASTECTOMY LEFT SENTINEL LYMPH NODE MAPPING MASTECTOMY, SIMPLE (Right) - RIGHT RISK REDUCING MASTECTOMY as a surgical intervention.  The patient's history has been reviewed, patient examined, no change in status, stable for surgery.  I have reviewed the patient's chart and labs.  Questions were answered to the patient's satisfaction.     [name] A Eddy Liszewski

## 2023-11-29 NOTE — Transfer of Care (Signed)
 Immediate Anesthesia Transfer of Care Note  Patient: [name]  Procedure(s) Performed: MASTECTOMY WITH SENTINEL LYMPH NODE BIOPSY (Left: Breast) MASTECTOMY, SIMPLE (Right: Breast)  Patient Location: PACU  Anesthesia Type:General  Level of Consciousness: awake  Airway & Oxygen Therapy: Patient Spontanous Breathing and Patient connected to nasal cannula oxygen  Post-op Assessment: Report given to RN  Post vital signs: Reviewed and stable  Last Vitals:  Vitals Value Taken Time  BP 148/87 11/29/23 12:19  Temp    Pulse 106 11/29/23 12:21  Resp 12 11/29/23 12:21  SpO2 99 % 11/29/23 12:21  Vitals shown include unfiled device data.  Last Pain:  Vitals:   11/29/23 0719  TempSrc: Temporal  PainSc: 0-No pain      Patients Stated Pain Goal: 4 (11/29/23 0719)  Complications: No notable events documented.

## 2023-11-29 NOTE — Op Note (Signed)
 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 and family history of breast cancer  Postoperative diagnosis: Same  Procedure: Left simple mastectomy with left axillary sentinel lymph node mapping of deep level 1 left Eksir lymph nodes using mag trace, right risk-reducing simple mastectomy  Surgeon: Debby Shipper, MD  Anesthesia: General With bilateral pectoral blocks  EBL: 50 cc  Specimen: Right breast to pathology, left breast to pathology, 3 left axillary sentinel nodes  Drains: 19 round 1 on each side, placed to bulb suction  Indications for procedure: The patient is a 63 year old female with a past history of left breast DCIS treated in the past with radiation therapy and lumpectomy.  She is presenting for treatment of her stage I left breast cancer recently diagnosed by core biopsy mammogram.  She reviewed our options and opted for bilateral mastectomies understanding the right would be a risk-reducing measure.  We reviewed the increased complication rate of this procedure as well in light of her decision.  Risks and benefits of bilateral mastectomies reviewed as well as alternative therapies.  Given her previous radiation history, the oncology team did not feel repeat lumpectomy at her age as a good option.  I did review sentinel lymph node mapping in the circumstance but explained that radiation therapy this may not be tailored reliable.  I discussed options of how this is performed using a tracer and removing just the nodes that the tracer close to.  After lengthy discussion of all of her options she agreed to proceed.  She had no interest in breast conserving surgery after evaluating that process.  She understood that she may have redundant skin given her large pannus breast we would do what we could do to reduce the amount of skin as best as we could.  A breast reducing pattern would have created an asymmetrical appearance.  Her  left breast was more smaller than the right as well.  She is aware of the cosmetic limitations in her circumstances.The surgical and non surgical options have been discussed with the patient.  Risks of surgery include bleeding,  Infection,  Flap necrosis,  Tissue loss,  Chronic pain, death, Numbness,  And the need for additional procedures.  Reconstruction options also have been discussed with the patient as well.  The patient agrees to proceed.      Description of procedure: The patient was met in the holding area and all questions were answered.  She underwent bilateral pectoral blocks per anesthesia.  She was taken back to the operative room and placed upon upon the OR table.  After induction of general anesthesia, both breasts were prepped and draped in a sterile fashion and a timeout was done.  2 cc of mag trace were injected into the left breast into the Sub areolar region.  The right side was done first.  The right side was much larger than her left.  We discussed using a breast reducing pattern versus the traditional elliptical incision.  Concern was that the left was so much smaller and that her goal was to be as symmetrical as possible.  This incision would have been difficult to do on the left due to radiation therapy and the significant smaller breast.  I opted to do a large wide elliptical incision on the right side to encompass as much skin as I could.  The superior skin flap was taken to the clavicle, inferior skin flap taken to the inframammary fold and the  medial portion of this to the sternum.  Once the breast was totally mobilized it was dissected off the chest wall in a medial lateral fashion until the lateral attachments identified and divided.  Hemostasis achieved with cautery and TXA soaked sponges.  She had significant redundant skin in the axilla and I excised this separately trying to reduce as much of the dogear and redundancy as I could.  Once this was done through a separate stab  incision a 19 round drain was placed.  It was secured to the skin with 2-0 nylon.  I then very tediously closed the right mastectomy wound with 3-0 Vicryl.  These were subcutaneous stitches.  There were stitches used to secure the flap of the chest wall as well.  3-0 Monocryl is used to close the skin in a subcuticular fashion.  The left side was then done.  In a similar fashion a large elliptical incision was made.  The superior skin flap was taken to the clavicle, inferior skin flap was taken to the inframammary fold, and the medial portion was taken to the sternum.  The breast was then dissected off the chest wall in a medial lateral fashion until lateral attachments identified and divided.  Mag trace probe was then used to identify 3 sentinel nodes in the left axilla which were level and these were removed.  There were no significant spikes in background check.  Long thoracic nerve, thoracodorsal trunk and axillary vein were all preserved.  Hemostasis achieved with TXA soaked sponge.  19 round drain placed in a similar fashion as the right.  The wound was then closed with a deep layer of 3-0 Vicryl and 3-0 Monocryl.  Bulb suction placed to bulb's on each side.  Dermabond applied.  Breast binder placed.  All counts found to be correct.  The patient was then awoke extubated taken to recovery in satisfactory condition.

## 2023-11-29 NOTE — Anesthesia Procedure Notes (Signed)
 Anesthesia Regional Block: Pectoralis block   Pre-Anesthetic Checklist: , timeout performed,  Correct Patient, Correct Site, Correct Laterality,  Correct Procedure, Correct Position, site marked,  Risks and benefits discussed,  Surgical consent,  Pre-op evaluation,  At surgeon's request and post-op pain management  Laterality: Left and Right  Prep: chloraprep       Needles:  Injection technique: Single-shot  Needle Type: Echogenic Stimulator Needle     Needle Length: 9cm  Needle Gauge: 21     Additional Needles:   Procedures:,,,, ultrasound used (permanent image in chart),,    Narrative:  Start time: 11/29/2023 7:57 AM End time: 11/29/2023 8:08 AM Injection made incrementally with aspirations every 5 mL.  Performed by: Personally  Anesthesiologist: Peggye Delon Brunswick, MD  Additional Notes: Discussed risks and benefits of nerve block including, but not limited to, prolonged and/or permanent nerve injury involving sensory and/or motor function. Monitors were applied and a time-out was performed. The nerve and associated structures were visualized under ultrasound guidance. After negative aspiration, local anesthetic was slowly injected around the nerve. There was no evidence of high pressure during the procedure. There were no paresthesias. VSS remained stable and the patient tolerated the procedure well.

## 2023-11-29 NOTE — Discharge Instructions (Signed)
[facility] [phone]  MASTECTOMY: POST OP INSTRUCTIONS  Always review your discharge instruction sheet given to you by the facility where your surgery was performed. IF YOU HAVE DISABILITY OR FAMILY LEAVE FORMS, YOU MUST BRING THEM TO THE OFFICE FOR PROCESSING.   DO NOT GIVE THEM TO YOUR DOCTOR. A prescription for pain medication may be given to you upon discharge.  Take your pain medication as prescribed, if needed.  If narcotic pain medicine is not needed, then you may take acetaminophen (Tylenol) or ibuprofen (Advil) as needed. Take your usually prescribed medications unless otherwise directed. If you need a refill on your pain medication, please contact your pharmacy.  They will contact our office to request authorization.  Prescriptions will not be filled after 5pm or on week-ends. You should follow a light diet the first few days after arrival home, such as soup and crackers, etc.  Resume your normal diet the day after surgery. Most patients will experience some swelling and bruising on the chest and underarm.  Ice packs will help.  Swelling and bruising can take several days to resolve.  It is common to experience some constipation if taking pain medication after surgery.  Increasing fluid intake and taking a stool softener (such as Colace) will usually help or prevent this problem from occurring.  A mild laxative (Milk of Magnesia or Miralax) should be taken according to package instructions if there are no bowel movements after 48 hours. Unless discharge instructions indicate otherwise, leave your bandage dry and in place until your next appointment in 3-5 days.  You may take a limited sponge bath.  No tube baths or showers until the drains are removed.  You may have steri-strips (small skin tapes) in place directly over the incision.  These strips should be left on the skin for 7-10 days.  If your surgeon used skin glue on the incision, you may shower in 24 hours.   The glue will flake off over the next 2-3 weeks.  Any sutures or staples will be removed at the office during your follow-up visit. DRAINS:  If you have drains in place, it is important to keep a list of the amount of drainage produced each day in your drains.  Before leaving the hospital, you should be instructed on drain care.  Call our office if you have any questions about your drains. ACTIVITIES:  You may resume regular (light) daily activities beginning the next day--such as daily self-care, walking, climbing stairs--gradually increasing activities as tolerated.  You may have sexual intercourse when it is comfortable.  Refrain from any heavy lifting or straining until approved by your doctor. You may drive when you are no longer taking prescription pain medication, you can comfortably wear a seatbelt, and you can safely maneuver your car and apply brakes. RETURN TO WORK:  __________________________________________________________ You should see your doctor in the office for a follow-up appointment approximately 3-5 days after your surgery.  Your doctor's nurse will typically make your follow-up appointment when she calls you with your pathology report.  Expect your pathology report 2-3 business days after your surgery.  You may call to check if you do not hear from us after three days.   OTHER INSTRUCTIONS: ______________________________________________________________________________________________ ____________________________________________________________________________________________ WHEN TO CALL YOUR DOCTOR: Fever over 101.0 Nausea and/or vomiting Extreme swelling or bruising Continued bleeding from incision. Increased pain, redness, or drainage from the incision. The clinic staff is available to answer your questions during regular business hours.  Please don't hesitate   to call and ask to speak to one of the nurses for clinical concerns.  If you have a medical emergency, go to the  nearest emergency room or call 911.  A surgeon from [facility] is always on call at the hospital. [address] [place], [place], [place]  [address] ? [address], [place], [place]   [address] [phone] ? [phone] ? FAX [phone] Web site: [email or web address]   About my Jackson-Pratt Bulb Drain  What is a Jackson-Pratt bulb? A Jackson-Pratt is a soft, round device used to collect drainage. It is connected to a long, thin drainage catheter, which is held in place by one or two small stiches near your surgical incision site. When the bulb is squeezed, it forms a vacuum, forcing the drainage to empty into the bulb.  Emptying the Jackson-Pratt bulb- To empty the bulb: 1. Release the plug on the top of the bulb. 2. Pour the bulb's contents into a measuring container which your nurse will provide. 3. Record the time emptied and amount of drainage. Empty the drain(s) as often as your     doctor or nurse recommends.  Date                  Time                    Amount (Drain 1)                 Amount (Drain 2)  _____________________________________________________________________  _____________________________________________________________________  _____________________________________________________________________  _____________________________________________________________________  _____________________________________________________________________  _____________________________________________________________________  _____________________________________________________________________  _____________________________________________________________________  Squeezing the Jackson-Pratt Bulb- To squeeze the bulb: 1. Make sure the plug at the top of the bulb is open. 2. Squeeze the bulb tightly in your fist. You will hear air squeezing from the bulb. 3. Replace the plug while the bulb is squeezed. 4. Use a safety pin to attach the bulb to your clothing.  This will keep the catheter from     pulling at the bulb insertion site.  When to call your doctor- Call your doctor if: Drain site becomes red, swollen or hot. You have a fever greater than 101 degrees F. There is oozing at the drain site. Drain falls out (apply a guaze bandage over the drain hole and secure it with tape). Drainage increases daily not related to activity patterns. (You will usually have more drainage when you are active than when you are resting.) Drainage has a bad odor.

## 2023-11-30 ENCOUNTER — Encounter (HOSPITAL_BASED_OUTPATIENT_CLINIC_OR_DEPARTMENT_OTHER): Payer: Self-pay | Admitting: Surgery

## 2023-11-30 DIAGNOSIS — C50412 Malignant neoplasm of upper-outer quadrant of left female breast: Secondary | ICD-10-CM | POA: Diagnosis not present

## 2023-11-30 NOTE — Discharge Summary (Signed)
 Physician Discharge Summary  Patient ID: [name] MRN: [id number] DOB/AGE: [birth year] 63 y.o.  Admit date: 11/29/2023 Discharge date: 11/30/2023  Admission Diagnoses:left breast cancer   Discharge Diagnoses: same  Active Problems:   * No active hospital problems. *   Discharged Condition: good  Hospital Course: pt did well afterbilateral mastectomy. Noissues POD 1     Treatments: surgery: bilateral mastectomy   Discharge Exam: Blood pressure (!) 118/57, pulse 81, temperature 98.8 F (37.1 C), resp. rate 16, height 5' 9 (1.753 m), weight 112.7 kg, SpO2 99%. General appearance: alert and cooperative Resp: clear to auscultation bilaterally Cardio: NSR  Incision/Wound: CDI flaps viable no hematoma   Disposition: Discharge disposition: 01-Home or Self Care       Discharge Instructions     Diet - low sodium heart healthy   Complete by: As directed    Diet - low sodium heart healthy   Complete by: As directed    Increase activity slowly   Complete by: As directed    Increase activity slowly   Complete by: As directed       Allergies as of 11/30/2023       Reactions   Ceclor [cefaclor] Other (See Comments)   Causes pain in esophagus   Erythromycin Hives, Itching   Green Tea (camellia Sinensis) Other (See Comments)   Eyes swell   Latex    Welts   Other Itching   pineapple   Penicillins Hives, Itching   Tetracycline Hives, Itching   Passion Fruit Flavoring Agent (non-screening)    Amoxicillin Hives   Pineapple Itching   Tetracyclines & Related Nausea Only        Medication List     TAKE these medications    Airsupra  90-80 MCG/ACT Aero Generic drug: Albuterol -Budesonide  Inhale 2 puffs into the lungs every 4 (four) hours as needed.   Airsupra  90-80 MCG/ACT Aero Generic drug: Albuterol -Budesonide  Inhale 2 puffs into the lungs every 4 (four) hours as needed.   albuterol  (2.5 MG/3ML) 0.083% nebulizer solution Commonly known as:  PROVENTIL  Take 3 mLs (2.5 mg total) by nebulization every 6 (six) hours as needed for wheezing or shortness of breath.   albuterol  108 (90 Base) MCG/ACT inhaler Commonly known as: VENTOLIN  HFA Inhale 2 puffs into the lungs every 6 (six) hours as needed for wheezing or shortness of breath.   ALPHA LIPOIC ACID PO Take by mouth.   aspirin  EC 81 MG tablet Take 1 tablet (81 mg total) by mouth daily. Swallow whole.   clobetasol  ointment 0.05 % Commonly known as: TEMOVATE  APPLY 1 APPLICATION TOPICALLY TWO TIMES DAILY. USE FOR 2- 3 WEEKS TOPS.   DULoxetine  60 MG capsule Commonly known as: CYMBALTA  Take 1 capsule (60 mg total) by mouth daily.   ELDERBERRY PO Take by mouth.   EPINEPHrine  0.3 mg/0.3 mL Soaj injection Commonly known as: EPI-PEN Inject 0.3 mg into the muscle as needed for anaphylaxis.   GLUCOSAMINE PO Take by mouth.   hydrochlorothiazide  50 MG tablet Commonly known as: HYDRODIURIL  Take 1 tablet (50 mg total) by mouth daily.   ibuprofen  800 MG tablet Commonly known as: ADVIL  Take 1 tablet (800 mg total) by mouth every 8 (eight) hours as needed.   LORazepam  0.5 MG tablet Commonly known as: ATIVAN  TAKE 1 TABLET BY MOUTH EVERY 8 HOURS AS NEEDED FOR ANXIETY   methocarbamol  500 MG tablet Commonly known as: ROBAXIN  Take 1 tablet (500 mg total) by mouth every 8 (eight) hours as needed for muscle  spasms.   multivitamin with minerals tablet Take 1 tablet by mouth daily. Vitamin D3 is in it (1,000Ius)   OIL OF OREGANO PO Take by mouth.   Olopatadine  HCl 0.2 % Soln Commonly known as: Pataday  Place 1 drop into both eyes 1 day or 1 dose.   OVER THE COUNTER MEDICATION Take by mouth daily. Sugar Blockers   oxyCODONE  5 MG immediate release tablet Commonly known as: Oxy IR/ROXICODONE  Take 1 tablet (5 mg total) by mouth every 6 (six) hours as needed for severe pain (pain score 7-10).   potassium chloride SA 20 MEQ tablet Commonly known as: KLOR-CON M Take 20 mEq by  mouth daily.   PROBIOTIC-10 PO Take 10 mg by mouth daily. Prebiotic/Probiotic   rosuvastatin  20 MG tablet Commonly known as: Crestor  Take 1 tablet (20 mg total) by mouth at bedtime.   tirzepatide  12.5 MG/0.5ML Pen Commonly known as: ZEPBOUND  Inject 12.5 mg into the skin once a week.         Signed: Debby LABOR Dionicia Hickman 11/30/2023, 9:10 AM

## 2023-12-01 ENCOUNTER — Other Ambulatory Visit: Payer: Self-pay | Admitting: Nurse Practitioner

## 2023-12-01 DIAGNOSIS — F32A Depression, unspecified: Secondary | ICD-10-CM

## 2023-12-03 ENCOUNTER — Ambulatory Visit: Payer: Self-pay | Admitting: Surgery

## 2023-12-03 LAB — SURGICAL PATHOLOGY

## 2023-12-04 ENCOUNTER — Other Ambulatory Visit: Payer: Self-pay | Admitting: Family Medicine

## 2023-12-04 DIAGNOSIS — R7303 Prediabetes: Secondary | ICD-10-CM

## 2023-12-04 MED ORDER — TIRZEPATIDE-WEIGHT MANAGEMENT 12.5 MG/0.5ML ~~LOC~~ SOAJ: 12.5000 mg | SUBCUTANEOUS | 2 refills | Status: DC

## 2023-12-04 NOTE — Telephone Encounter (Signed)
 Copied from CRM #[id number]. Topic: Clinical - Medication Refill >> Dec 04, 2023 11:31 AM Carlatta H wrote: Medication: tirzepatide  (ZEPBOUND ) 12.5 MG/0.5ML Pen  Has the patient contacted their pharmacy? No (Agent: If no, request that the patient contact the pharmacy for the refill. If patient does not wish to contact the pharmacy document the reason why and proceed with request.) (Agent: If yes, when and what did the pharmacy advise?)  This is the patient's preferred pharmacy:  [facility] [address], [place] - [address] [place] [address] Phone: [phone] Fax: [phone]    Is this the correct pharmacy for this prescription? Yes If no, delete pharmacy and type the correct one.   Has the prescription been filled recently? No  Is the patient out of the medication? Yes  Has the patient been seen for an appointment in the last year OR does the patient have an upcoming appointment? No  Can we respond through MyChart? Yes  Agent: Please be advised that Rx refills may take up to 3 business days. We ask that you follow-up with your pharmacy.

## 2023-12-05 ENCOUNTER — Telehealth: Payer: Self-pay | Admitting: *Deleted

## 2023-12-05 ENCOUNTER — Encounter: Payer: Self-pay | Admitting: *Deleted

## 2023-12-05 NOTE — Telephone Encounter (Signed)
Ordered oncotype per Dr. Iruku. Sent requisition to pathology and exact sciences. 

## 2023-12-10 ENCOUNTER — Telehealth: Payer: Self-pay | Admitting: Pharmacy Technician

## 2023-12-10 ENCOUNTER — Other Ambulatory Visit (HOSPITAL_COMMUNITY): Payer: Self-pay

## 2023-12-10 ENCOUNTER — Telehealth: Payer: Self-pay

## 2023-12-10 NOTE — Telephone Encounter (Signed)
 Pharmacy Patient Advocate Encounter   Received notification from Pt Calls Messages that prior authorization for Zepbound  is required/requested.   Insurance verification completed.   The patient is insured through [facility] MEDICAID .  Effective October 1st, Medicaid will discontinue coverage of GLP1 medications for weight loss (such as Wegovy  and Zepbound ), unless the patient has a documented history of a heart attack or stroke. Zepbound  will continue to be covered only for patients with moderate to severe sleep apnea (AHI 15-30). Because of this change, the prior authorization team will not be submitting new PA requests for GLP1 medications prescribed for weight loss between now and October 1st, as patients will be unable to continue therapy under Medicaid coverage.

## 2023-12-10 NOTE — Telephone Encounter (Signed)
 She must have a diagnosis of sleep apnea confirmed by a sleep study in order to get approval from Medicaid to continue use.    Effective October 1st, Medicaid will discontinue coverage of GLP1 medications for weight loss (such as Wegovy  and Zepbound ), unless the patient has a documented history of a heart attack or stroke. Zepbound  will continue to be covered only for patients with moderate to severe sleep apnea (AHI 15-30). Because of this change, the prior authorization team will not be submitting new PA requests for GLP1 medications prescribed for weight loss between now and October 1st, as patients will be unable to continue therapy under Medicaid coverage.

## 2023-12-10 NOTE — Telephone Encounter (Signed)
 Copied from CRM #[id number]. Topic: Clinical - Medication Question >> Dec 10, 2023 10:23 AM Delon HERO wrote: Reason for CRM: Patient is calling to express concerns that her zepbound  no be approved moving forward with [place] Medicaid. Patient has 50lbs left to her goal of 185. What would happen if her medication is no longer approved through [facility] Medicaid? Please advise.

## 2023-12-11 ENCOUNTER — Encounter (HOSPITAL_COMMUNITY): Payer: Self-pay

## 2023-12-20 ENCOUNTER — Telehealth: Payer: Self-pay | Admitting: *Deleted

## 2023-12-20 ENCOUNTER — Encounter: Payer: Self-pay | Admitting: *Deleted

## 2023-12-20 ENCOUNTER — Other Ambulatory Visit: Payer: Self-pay

## 2023-12-20 NOTE — Telephone Encounter (Signed)
 Received oncotype results 21/7%. Appt with Dr. Loretha 10/13

## 2023-12-21 ENCOUNTER — Telehealth: Payer: Self-pay

## 2023-12-21 ENCOUNTER — Inpatient Hospital Stay: Payer: Self-pay

## 2023-12-21 NOTE — Telephone Encounter (Signed)
 Spoke with patient and confirmed appointment on 10/13

## 2023-12-24 ENCOUNTER — Inpatient Hospital Stay: Attending: Hematology and Oncology | Admitting: Hematology and Oncology

## 2023-12-24 VITALS — BP 128/79 | HR 101 | Temp 97.9°F | Resp 16 | Wt 239.8 lb

## 2023-12-24 DIAGNOSIS — Z79811 Long term (current) use of aromatase inhibitors: Secondary | ICD-10-CM | POA: Insufficient documentation

## 2023-12-24 DIAGNOSIS — Z170 Estrogen receptor positive status [ER+]: Secondary | ICD-10-CM | POA: Diagnosis not present

## 2023-12-24 DIAGNOSIS — Z9013 Acquired absence of bilateral breasts and nipples: Secondary | ICD-10-CM | POA: Diagnosis not present

## 2023-12-24 DIAGNOSIS — Z87891 Personal history of nicotine dependence: Secondary | ICD-10-CM | POA: Diagnosis not present

## 2023-12-24 DIAGNOSIS — C50312 Malignant neoplasm of lower-inner quadrant of left female breast: Secondary | ICD-10-CM | POA: Diagnosis not present

## 2023-12-24 MED ORDER — LETROZOLE 2.5 MG PO TABS: 2.5000 mg | ORAL_TABLET | Freq: Every day | ORAL | 3 refills | Status: AC

## 2023-12-24 NOTE — Progress Notes (Signed)
 [facility] CONSULT NOTE  Patient Care Team: Glennon Sand, NP (Inactive) as PCP - General (Nurse Practitioner) Harvey Margo CROME, MD (Inactive) as Consulting Physician (Gastroenterology) Tyree Nanetta SAILOR, RN as Oncology Nurse Navigator Gerome, Devere HERO, RN as Oncology Nurse Navigator Vanderbilt Ned, MD as Consulting Physician (General Surgery) Loretha Ash, MD as Consulting Physician (Hematology and Oncology) Shannon Agent, MD as Consulting Physician (Radiation Oncology)  CHIEF COMPLAINTS/PURPOSE OF CONSULTATION:  Newly diagnosed breast cancer  HISTORY OF PRESENTING ILLNESS:  [name] 63 y.o. female is here because of recent diagnosis of left breast IDC  I reviewed her records extensively and collaborated the history with the patient.  SUMMARY OF ONCOLOGIC HISTORY: Oncology History  Breast cancer of lower-outer quadrant of left female breast (HCC) (Resolved)  09/25/2007 Surgery   Left breast lumpectomy: DCIS ER/PR positive   11/04/2007 - 12/17/2007 Radiation Therapy   Adjuvant radiation therapy   03/26/2008 - 03/27/2013 Anti-estrogen oral therapy   Tamoxifen 20 mg daily   Malignant neoplasm of lower-inner quadrant of left breast in female, estrogen receptor positive (HCC)  11/05/2023 Initial Diagnosis   Malignant neoplasm of lower-inner quadrant of left breast in female, estrogen receptor positive (HCC)   11/07/2023 Cancer Staging   Staging form: Breast, AJCC 8th Edition - Clinical stage from 11/07/2023: Stage IA (cT1c, cN0, cM0, G2, ER+, PR+, HER2-) - Signed by Loretha Ash, MD on 11/07/2023 Stage prefix: Initial diagnosis Histologic grading system: 3 grade system Laterality: Left Staged by: Pathologist and managing physician Stage used in treatment planning: Yes National guidelines used in treatment planning: Yes Type of national guideline used in treatment planning: NCCN    Genetic Testing   Negative CancerNext-Expanded +RNAinsight. VUS in  POLE. The CancerNext-Expanded gene panel offered by [facility] and includes sequencing, rearrangement, and RNA analysis for the following 77 genes: AIP, ALK, APC, ATM, AXIN2, BAP1, BARD1, BMPR1A, BRCA1, BRCA2, BRIP1, CDC73, CDH1, CDK4, CDKN1B, CDKN2A, CEBPA, CHEK2, CTNNA1, DDX41, DICER1, EGFR, EPCAM, ETV6, FH, FLCN, GATA2, GREM1, HOXB13, KIT, LZTR1, MAX, MBD4, MEN1, MET, MITF, MLH1, MSH2, MSH3, MSH6, MUTYH, NF1, NF2, NTHL1, PALB2, PDGFRA, PHOX2B, PMS2, POLD1, POLE, POT1, PRKAR1A, PTCH1, PTEN, RAD51C, RAD51D, RB1, RET, RPS20, RUNX1, SDHA, SDHAF2, SDHB, SDHC, SDHD, SMAD4, SMARCA4, SMARCB1, SMARCE1, STK11, SUFU, TMEM127, TP53, TSC1, TSC2, VHL, WT1. Report date 11/15/23.     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  She is here for follow up after surgery. She is mostly healing well except for some open wounds in her bilateral mastectomy scar. She notices some serous drainage in both wounds, no def purulence. She is packing the wound and had her binder in place today. No fevers or chills described.  Rest of the pertinent 10 point ROS reviewed and negative MEDICAL HISTORY:  Past Medical History:  Diagnosis Date   Anxiety    Asthma    Asthma due to seasonal allergies    Breast cancer (HCC) 2009   Tamoxifen, left lumpectomy   Cough    Cramps, muscle, general    Depression    Diarrhea    Diverticulosis    Eczema    Hypertension    Palpitation    Personal history of radiation therapy    Pre-diabetes    Visual disturbance    Wheezing  SURGICAL HISTORY: Past Surgical History:  Procedure Laterality Date   ABDOMINAL HYSTERECTOMY  09/10/2005   still have ovaries   BIOPSY N/A 09/08/2014   Procedure: BIOPSY random colon;  Surgeon: Margo LITTIE Haddock, MD;  Location: [facility];  Service: Endoscopy;  Laterality: N/A;   BREAST BIOPSY Right 10/2020   Breast tissue with fibrosis.  No atypia or malignancy.   BREAST BIOPSY Left 10/26/2023   US  LT BREAST BX W LOC DEV 1ST LESION IMG BX SPEC US  GUIDE 10/26/2023 [facility] MAMMOGRAPHY   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   COLONOSCOPY WITH PROPOFOL  N/A 09/08/2014   Procedure: COLONOSCOPY WITH PROPOFOL ; in cecum at 0904; withdrawal time 16 minutes;  Surgeon: Margo LITTIE Haddock, MD;  Location: [facility];  Service: Endoscopy;  Laterality: N/A;   MASTECTOMY W/ SENTINEL NODE BIOPSY Left 11/29/2023   Procedure: MASTECTOMY WITH SENTINEL LYMPH NODE BIOPSY;  Surgeon: Vanderbilt Ned, MD;  Location: [facility];  Service: General;  Laterality: Left;  GEN w/PEC BLOCK LEFT SIMPLE MASTECTOMY LEFT SENTINEL LYMPH NODE MAPPING   TOTAL MASTECTOMY Right 11/29/2023   Procedure: MASTECTOMY, SIMPLE;  Surgeon: Vanderbilt Ned, MD;  Location: [facility];  Service: General;  Laterality: Right;  RIGHT RISK REDUCING MASTECTOMY    SOCIAL HISTORY: Social History   Socioeconomic History   Marital status: Divorced    Spouse name: Not on file   Number of children: Not on file   Years of education: Not on file   Highest education level: Not on file  Occupational History   Occupation: Social Services  Tobacco Use   Smoking status: Former    Current packs/day: 0.00    Types: Cigarettes    Quit date: 03/02/1990    Years since quitting: 33.8   Smokeless tobacco: Never  Vaping Use   Vaping status: Never Used  Substance and Sexual Activity   Alcohol use: No    Alcohol/week: 0.0 standard drinks of alcohol   Drug use: No   Sexual activity: Not Currently    Birth control/protection: Surgical    Comment: hyst  Other Topics Concern   Not on file  Social History Narrative   Not on file   Social Drivers of  Health   Financial Resource Strain: Low Risk  (10/05/2020)   Overall Financial Resource Strain (CARDIA)    Difficulty of Paying Living Expenses: Not very hard  Food Insecurity: No Food Insecurity (11/07/2023)   Hunger Vital Sign    Worried About Running Out of Food in the Last Year: Never true    Ran Out of Food in the Last Year: Never true  Transportation Needs: No Transportation Needs (11/07/2023)   PRAPARE - Administrator, Civil Service (Medical): No    Lack of Transportation (Non-Medical): No  Physical Activity: Insufficiently Active (10/05/2020)   Exercise Vital Sign    Days of Exercise per Week: 5 days    Minutes of Exercise per Session: 20 min  Stress: Stress Concern Present (10/05/2020)   [employer] of Occupational Health - Occupational Stress Questionnaire    Feeling of Stress : To some extent  Social Connections: Moderately Integrated (10/05/2020)   Social Connection and Isolation Panel    Frequency of Communication with Friends and Family: Twice a week    Frequency of Social Gatherings with Friends and Family: Twice a week    Attends Religious Services: More than  4 times per year    Active Member of Clubs or Organizations: Yes    Attends Banker Meetings: More than 4 times per year    Marital Status: Divorced  Intimate Partner Violence: Not At Risk (11/07/2023)   Humiliation, Afraid, Rape, and Kick questionnaire    Fear of Current or Ex-Partner: No    Emotionally Abused: No    Physically Abused: No    Sexually Abused: No    FAMILY HISTORY: Family History  Problem Relation Age of Onset   Diabetes Mother    Stroke Mother    Hypertension Mother    Other Mother 9       Smoldering myeloma   Macular degeneration Mother    Congestive Heart Failure Mother    Diabetes Father    Hypertension Sister    Hypertension Brother    Prostate cancer Brother 31   Prostate cancer Brother 59   Prostate cancer Maternal Uncle 6   Ovarian cancer  Paternal Aunt    Breast cancer Paternal Aunt 19   Breast cancer Niece 64       bilateral   Lung cancer Paternal Cousin        smoked, first cousin   Breast cancer Paternal Cousin 61       first cousin once removed   Prostate cancer Maternal Cousin 49       first cousin once removed    ALLERGIES:  is allergic to ceclor [cefaclor], erythromycin, green tea (camellia sinensis), latex, other, penicillins, tetracycline, passion fruit flavoring agent (non-screening), amoxicillin, pineapple, and tetracyclines & related.  MEDICATIONS:  Current Outpatient Medications  Medication Sig Dispense Refill   letrozole (FEMARA) 2.5 MG tablet Take 1 tablet (2.5 mg total) by mouth daily. 90 tablet 3   albuterol  (PROVENTIL ) (2.5 MG/3ML) 0.083% nebulizer solution Take 3 mLs (2.5 mg total) by nebulization every 6 (six) hours as needed for wheezing or shortness of breath. 75 mL 1   albuterol  (VENTOLIN  HFA) 108 (90 Base) MCG/ACT inhaler Inhale 2 puffs into the lungs every 6 (six) hours as needed for wheezing or shortness of breath. 1 each 1   Albuterol -Budesonide  (AIRSUPRA ) 90-80 MCG/ACT AERO Inhale 2 puffs into the lungs every 4 (four) hours as needed. 10.7 g 5   Albuterol -Budesonide  (AIRSUPRA ) 90-80 MCG/ACT AERO Inhale 2 puffs into the lungs every 4 (four) hours as needed.     ALPHA LIPOIC ACID PO Take by mouth.     aspirin  EC 81 MG tablet Take 1 tablet (81 mg total) by mouth daily. Swallow whole. 90 tablet 3   clobetasol  ointment (TEMOVATE ) 0.05 % APPLY 1 APPLICATION TOPICALLY TWO TIMES DAILY. USE FOR 2- 3 WEEKS TOPS. 30 g 0   DULoxetine  (CYMBALTA ) 60 MG capsule Take 1 capsule by mouth once daily 90 capsule 0   ELDERBERRY PO Take by mouth.     EPINEPHrine  0.3 mg/0.3 mL IJ SOAJ injection Inject 0.3 mg into the muscle as needed for anaphylaxis. 2 each 1   Glucosamine HCl (GLUCOSAMINE PO) Take by mouth.     hydrochlorothiazide  (HYDRODIURIL ) 50 MG tablet Take 1 tablet by mouth once daily 90 tablet 0   ibuprofen   (ADVIL ) 800 MG tablet Take 1 tablet (800 mg total) by mouth every 8 (eight) hours as needed. 30 tablet 0   LORazepam  (ATIVAN ) 0.5 MG tablet TAKE 1 TABLET BY MOUTH EVERY 8 HOURS AS NEEDED FOR ANXIETY 90 tablet 0   methocarbamol  (ROBAXIN ) 500 MG tablet Take 1 tablet (500 mg total)  by mouth every 8 (eight) hours as needed for muscle spasms. 15 tablet 0   Multiple Vitamins-Minerals (MULTIVITAMIN WITH MINERALS) tablet Take 1 tablet by mouth daily. Vitamin D3 is in it (1,000Ius)     OIL OF OREGANO PO Take by mouth.     Olopatadine  HCl (PATADAY ) 0.2 % SOLN Place 1 drop into both eyes 1 day or 1 dose. 2.5 mL 5   OVER THE COUNTER MEDICATION Take by mouth daily. Sugar Blockers     oxyCODONE  (OXY IR/ROXICODONE ) 5 MG immediate release tablet Take 1 tablet (5 mg total) by mouth every 6 (six) hours as needed for severe pain (pain score 7-10). 15 tablet 0   potassium chloride SA (K-DUR,KLOR-CON) 20 MEQ tablet Take 20 mEq by mouth daily.     Probiotic Product (PROBIOTIC-10 PO) Take 10 mg by mouth daily. Prebiotic/Probiotic     rosuvastatin  (CRESTOR ) 20 MG tablet Take 1 tablet (20 mg total) by mouth at bedtime. 90 tablet 0   tirzepatide  (ZEPBOUND ) 12.5 MG/0.5ML Pen Inject 12.5 mg into the skin once a week. 6 mL 2   No current facility-administered medications for this visit.    REVIEW OF SYSTEMS:   Constitutional: Denies fevers, chills or abnormal night sweats Eyes: Denies blurriness of vision, double vision or watery eyes Ears, nose, mouth, throat, and face: Denies mucositis or sore throat Respiratory: Denies cough, dyspnea or wheezes Cardiovascular: Denies palpitation, chest discomfort or lower extremity swelling Gastrointestinal:  Denies nausea, heartburn or change in bowel habits Skin: Denies abnormal skin rashes Lymphatics: Denies new lymphadenopathy or easy bruising Neurological:Denies numbness, tingling or new weaknesses Behavioral/Psych: Mood is stable, no new changes  Breast:  Denies any palpable  lumps or discharge All other systems were reviewed with the patient and are negative.  PHYSICAL EXAMINATION: ECOG PERFORMANCE STATUS: 0 - Asymptomatic  Vitals:   12/24/23 1001  BP: 128/79  Pulse: (!) 101  Resp: 16  Temp: 97.9 F (36.6 C)  SpO2: 100%    Filed Weights   12/24/23 1001  Weight: 239 lb 12.8 oz (108.8 kg)     GENERAL:alert, no distress and comfortable Bilateral mastectomy scars noted. No evidence of infection. Serous drainage noted.   LABORATORY DATA:  I have reviewed the data as listed Lab Results  Component Value Date   WBC 4.2 11/07/2023   HGB 13.4 11/07/2023   HCT 38.8 11/07/2023   MCV 82.6 11/07/2023   PLT 245 11/07/2023   Lab Results  Component Value Date   NA 140 11/07/2023   K 3.0 (L) 11/07/2023   CL 100 11/07/2023   CO2 31 11/07/2023    RADIOGRAPHIC STUDIES: I have personally reviewed the radiological reports and agreed with the findings in the report.  ASSESSMENT AND PLAN:  Malignant neoplasm of lower-inner quadrant of left breast in female, estrogen receptor positive (HCC) This is a very pleasant 63 year old female patient with past medical history significant for left breast DCIS back in 2009 status postlumpectomy, adjuvant radiation and 5 years of tamoxifen referred to medical oncology for new diagnosis of left breast IDC. On mammogram and ultrasound, she has a new left breast invasive ductal carcinoma, grade 2 measuring 1.3 cm ER positive strong staining PR positive weak staining, Ki-67 5% HER2 negative.  She is now s/p bilateral mastectomy, left breast 3 cm IDC, neg margins, SLN neg. We reviewed final path and oncotype results, no role for adj chemotherapy. We discussed about proceeding with aromatase inhibitors. Discussed mechanism of action of aromatase inhibitors, adverse effects  of aromatase inhibitors including but not limited to menopausal symptoms, vaginal dryness, bone density loss etc. She will start letrozole and come back to  see us  in 3 months for SCP and tox check follow up.  Amber Stalls MD     All questions were answered. The patient knows to call the clinic with any problems, questions or concerns.    Amber Stalls, MD 12/24/23

## 2023-12-24 NOTE — Assessment & Plan Note (Signed)
 This is a very pleasant 63 year old female patient with past medical history significant for left breast DCIS back in 2009 status postlumpectomy, adjuvant radiation and 5 years of tamoxifen referred to medical oncology for new diagnosis of left breast IDC. On mammogram and ultrasound, she has a new left breast invasive ductal carcinoma, grade 2 measuring 1.3 cm ER positive strong staining PR positive weak staining, Ki-67 5% HER2 negative.  She is now s/p bilateral mastectomy, left breast 3 cm IDC, neg margins, SLN neg. We reviewed final path and oncotype results, no role for adj chemotherapy. We discussed about proceeding with aromatase inhibitors. Discussed mechanism of action of aromatase inhibitors, adverse effects of aromatase inhibitors including but not limited to menopausal symptoms, vaginal dryness, bone density loss etc. She will start letrozole and come back to see us  in 3 months for SCP and tox check follow up.  Amber Stalls MD

## 2023-12-24 NOTE — Progress Notes (Signed)
 Assessment: Wound care performed to right chest wound s/p mastectomy. Wound shows no evidence of infection as there is no odor, swelling, or erythema observed. Patient reports serosanguineous drainage and has been changing every day at home using saline gauze and a maxi pad.   Treatment: Wound cleaned with saline flush and dried. Packed wound with iodoform packing strip. ABD pad applied and secured with Hypafix dsg.  Patient Education: [name] was educated on proper wound care technique, including dressing changes, monitoring for signs of infection, and when to notify provider. Patient verbalized understanding. D/C ambulatory accompanied by friend.

## 2023-12-25 ENCOUNTER — Telehealth: Payer: Self-pay | Admitting: Hematology and Oncology

## 2023-12-25 ENCOUNTER — Encounter: Payer: Self-pay | Admitting: *Deleted

## 2023-12-25 DIAGNOSIS — J302 Other seasonal allergic rhinitis: Secondary | ICD-10-CM | POA: Diagnosis not present

## 2023-12-25 DIAGNOSIS — C50312 Malignant neoplasm of lower-inner quadrant of left female breast: Secondary | ICD-10-CM

## 2023-12-25 NOTE — Progress Notes (Signed)
 VIAL MADE ON 12/25/23

## 2023-12-25 NOTE — Telephone Encounter (Signed)
 left vm for patient about scheduled appt date and time. Encouraged to call back and reschedule if nee to

## 2023-12-26 ENCOUNTER — Telehealth: Payer: Self-pay

## 2023-12-26 NOTE — Telephone Encounter (Signed)
 Spoke with Dr Loretha because patient was just seen on Monday 10/13.SABRA per Dr Loretha ok to cancel the appointment on 10/16 and advised patient to just follow up at the January appointment.. patient confirmed and understood.

## 2023-12-27 ENCOUNTER — Inpatient Hospital Stay: Admitting: Hematology and Oncology

## 2023-12-27 NOTE — Progress Notes (Unsigned)
 63 y.o. G0P0000 female here with recurrent breast cancer (2009, 2025 s/p bilateral mastectomy, [facility], Dr. Vanderbilt and Praveena), hx of [place] hysterectomy (fibroids) for annual exam. Divorced. PCP: Bevely Doffing, FNP   No LMP recorded. Patient has had a hysterectomy.   She reports rash in the crease of the vagina for awhile. Hx of eczema, Using clobetasol  with improvement.  Mastectomy with Dr. Vanderbilt, managing cellulitis and wound separation with abx and packing. Next appt 01/11/24.  Abnormal bleeding: none Pelvic discharge or pain: none Breast mass, nipple discharge or skin changes : recent mastectomy Sept 2025  Sexually active: Not currently Birth control: None Last PAP:     Component Value Date/Time   DIAGPAP  10/05/2020 1512    - Negative for intraepithelial lesion or malignancy (NILM)   HPVHIGH Negative 10/05/2020 1512   ADEQPAP  10/05/2020 1512    Satisfactory for evaluation; transformation zone component PRESENT.   Last mammogram: 10/24/23 density b, birads 4 Last colonoscopy: 09/08/14 90yr recall DXA: ordered by Dr. Amber  Exercising: Not currently Smoker: No  Flowsheet Row Office Visit from 12/31/2023 in [facility]  PHQ-2 Total Score 1    Flowsheet Row Office Visit from 09/24/2023 in [facility]  PHQ-9 Total Score 2     GYN HISTORY: History of recurrent breast cancer, 2025 Supracervical hysterectomy for fibroids  OB History  Gravida Para Term Preterm AB Living  0 0 0 0 0 0  SAB IAB Ectopic Multiple Live Births  0 0 0 0 0   Past Medical History:  Diagnosis Date  . Anxiety   . Asthma   . Asthma due to seasonal allergies   . Breast cancer (HCC) 2009   Tamoxifen, left lumpectomy  . Cough   . Cramps, muscle, general   . Depression   . Diarrhea   . Diverticulosis   . Eczema   . Hypertension   . Palpitation   . Personal history of radiation therapy   . Pre-diabetes   . Visual disturbance    . Wheezing    Past Surgical History:  Procedure Laterality Date  . ABDOMINAL HYSTERECTOMY  09/10/2005   still have ovaries  . BIOPSY N/A 09/08/2014   Procedure: BIOPSY random colon;  Surgeon: Margo LITTIE Haddock, MD;  Location: [facility];  Service: Endoscopy;  Laterality: N/A;  . BREAST BIOPSY Right 10/2020   Breast tissue with fibrosis.  No atypia or malignancy.  SABRA BREAST BIOPSY Left 10/26/2023   US  LT BREAST BX W LOC DEV 1ST LESION IMG BX SPEC US  GUIDE 10/26/2023 [facility] MAMMOGRAPHY  . 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  . COLONOSCOPY WITH PROPOFOL  N/A 09/08/2014   Procedure: COLONOSCOPY WITH PROPOFOL ; in cecum at 0904; withdrawal time 16 minutes;  Surgeon: Margo LITTIE Haddock, MD;  Location: [facility];  Service: Endoscopy;  Laterality: N/A;  . MASTECTOMY W/ SENTINEL NODE BIOPSY Left 11/29/2023   Procedure: MASTECTOMY WITH SENTINEL LYMPH NODE BIOPSY;  Surgeon: Vanderbilt Ned, MD;  Location: [facility];  Service: General;  Laterality: Left;  GEN w/PEC BLOCK LEFT SIMPLE MASTECTOMY LEFT SENTINEL LYMPH NODE MAPPING  . TOTAL MASTECTOMY Right 11/29/2023   Procedure: MASTECTOMY, SIMPLE;  Surgeon: Vanderbilt Ned, MD;  Location: [facility];  Service: General;  Laterality: Right;  RIGHT RISK REDUCING MASTECTOMY   Current Outpatient Medications on File Prior to Visit  Medication  Sig Dispense Refill  . albuterol  (PROVENTIL ) (2.5 MG/3ML) 0.083% nebulizer solution Take 3 mLs (2.5 mg total) by nebulization every 6 (six) hours as needed for wheezing or shortness of breath. 75 mL 1  . albuterol  (VENTOLIN  HFA) 108 (90 Base) MCG/ACT inhaler Inhale 2 puffs into the lungs every 6 (six) hours as needed for wheezing or shortness of breath. 1 each 1  . Albuterol -Budesonide  (AIRSUPRA ) 90-80 MCG/ACT AERO Inhale 2 puffs into the lungs every 4 (four) hours as needed. 10.7 g 5  . Albuterol -Budesonide   (AIRSUPRA ) 90-80 MCG/ACT AERO Inhale 2 puffs into the lungs every 4 (four) hours as needed.    . ALPHA LIPOIC ACID PO Take by mouth.    . aspirin  EC 81 MG tablet Take 1 tablet (81 mg total) by mouth daily. Swallow whole. 90 tablet 3  . clobetasol  ointment (TEMOVATE ) 0.05 % APPLY 1 APPLICATION TOPICALLY TWO TIMES DAILY. USE FOR 2- 3 WEEKS TOPS. 30 g 0  . DULoxetine  (CYMBALTA ) 60 MG capsule Take 1 capsule by mouth once daily 90 capsule 0  . ELDERBERRY PO Take by mouth.    . EPINEPHrine  0.3 mg/0.3 mL IJ SOAJ injection Inject 0.3 mg into the muscle as needed for anaphylaxis. 2 each 1  . Glucosamine HCl (GLUCOSAMINE PO) Take by mouth.    . hydrochlorothiazide  (HYDRODIURIL ) 50 MG tablet Take 1 tablet by mouth once daily 90 tablet 0  . ibuprofen  (ADVIL ) 800 MG tablet Take 1 tablet (800 mg total) by mouth every 8 (eight) hours as needed. 30 tablet 0  . letrozole (FEMARA) 2.5 MG tablet Take 1 tablet (2.5 mg total) by mouth daily. 90 tablet 3  . LORazepam  (ATIVAN ) 0.5 MG tablet TAKE 1 TABLET BY MOUTH EVERY 8 HOURS AS NEEDED FOR ANXIETY 90 tablet 0  . LYSINE PO Take by mouth.    . methocarbamol  (ROBAXIN ) 500 MG tablet Take 1 tablet (500 mg total) by mouth every 8 (eight) hours as needed for muscle spasms. 15 tablet 0  . Multiple Vitamins-Minerals (MULTIVITAMIN WITH MINERALS) tablet Take 1 tablet by mouth daily. Vitamin D3 is in it (1,000Ius)    . OIL OF OREGANO PO Take by mouth.    SABRA OVER THE COUNTER MEDICATION Take by mouth daily. Sugar Blockers    . oxyCODONE  (OXY IR/ROXICODONE ) 5 MG immediate release tablet Take 1 tablet (5 mg total) by mouth every 6 (six) hours as needed for severe pain (pain score 7-10). 15 tablet 0  . potassium chloride SA (K-DUR,KLOR-CON) 20 MEQ tablet Take 20 mEq by mouth daily.    . Probiotic Product (PROBIOTIC-10 PO) Take 10 mg by mouth daily. Prebiotic/Probiotic    . rosuvastatin  (CRESTOR ) 20 MG tablet Take 1 tablet (20 mg total) by mouth at bedtime. 90 tablet 0  .  sulfamethoxazole-trimethoprim (BACTRIM DS) 800-160 MG tablet SMARTSIG:1 Tablet(s) By Mouth Every 12 Hours    . tirzepatide  (ZEPBOUND ) 12.5 MG/0.5ML Pen Inject 12.5 mg into the skin once a week. 6 mL 2  . Olopatadine  HCl (PATADAY ) 0.2 % SOLN Place 1 drop into both eyes 1 day or 1 dose. (Patient not taking: Reported on 12/31/2023) 2.5 mL 5   No current facility-administered medications on file prior to visit.   Social History   Socioeconomic History  . Marital status: Divorced    Spouse name: Not on file  . Number of children: Not on file  . Years of education: Not on file  . Highest education level: Not on file  Occupational History  .  Occupation: Engineer, site  Tobacco Use  . Smoking status: Former    Current packs/day: 0.00    Types: Cigarettes    Quit date: 03/02/1990    Years since quitting: 33.8  . Smokeless tobacco: Never  Vaping Use  . Vaping status: Never Used  Substance and Sexual Activity  . Alcohol use: No    Alcohol/week: 0.0 standard drinks of alcohol  . Drug use: No  . Sexual activity: Not Currently    Birth control/protection: Surgical    Comment: hyst  Other Topics Concern  . Not on file  Social History Narrative  . Not on file   Social Drivers of Health   Financial Resource Strain: Low Risk  (10/05/2020)   Overall Financial Resource Strain (CARDIA)   . Difficulty of Paying Living Expenses: Not very hard  Food Insecurity: No Food Insecurity (11/07/2023)   Hunger Vital Sign   . Worried About Programme researcher, broadcasting/film/video in the Last Year: Never true   . Ran Out of Food in the Last Year: Never true  Transportation Needs: No Transportation Needs (11/07/2023)   PRAPARE - Transportation   . Lack of Transportation (Medical): No   . Lack of Transportation (Non-Medical): No  Physical Activity: Insufficiently Active (10/05/2020)   Exercise Vital Sign   . Days of Exercise per Week: 5 days   . Minutes of Exercise per Session: 20 min  Stress: Stress Concern Present  (10/05/2020)   [employer] of Occupational Health - Occupational Stress Questionnaire   . Feeling of Stress : To some extent  Social Connections: Moderately Integrated (10/05/2020)   Social Connection and Isolation Panel   . Frequency of Communication with Friends and Family: Twice a week   . Frequency of Social Gatherings with Friends and Family: Twice a week   . Attends Religious Services: More than 4 times per year   . Active Member of Clubs or Organizations: Yes   . Attends Banker Meetings: More than 4 times per year   . Marital Status: Divorced  Catering manager Violence: Not At Risk (11/07/2023)   Humiliation, Afraid, Rape, and Kick questionnaire   . Fear of Current or Ex-Partner: No   . Emotionally Abused: No   . Physically Abused: No   . Sexually Abused: No   Family History  Problem Relation Age of Onset  . Diabetes Mother   . Stroke Mother   . Hypertension Mother   . Other Mother 63       Smoldering myeloma  . Macular degeneration Mother   . Congestive Heart Failure Mother   . Diabetes Father   . Hypertension Sister   . Hypertension Brother   . Prostate cancer Brother 79  . Prostate cancer Brother 54  . Prostate cancer Maternal Uncle 85  . Ovarian cancer Paternal Aunt   . Breast cancer Paternal Aunt 40  . Breast cancer Niece 48       bilateral  . Lung cancer Paternal Cousin        smoked, first cousin  . Breast cancer Paternal Cousin 44       first cousin once removed  . Pancreatic cancer Paternal Cousin   . Prostate cancer Maternal Cousin 51       first cousin once removed   Allergies  Allergen Reactions  . Ceclor [Cefaclor] Other (See Comments)    Causes pain in esophagus  . Erythromycin Hives and Itching  . Green Tea (Camellia Sinensis) Other (See Comments)  Eyes swell  . Latex     Welts   . Other Itching    pineapple  . Penicillins Hives and Itching  . Tetracycline Hives and Itching  . Lisinopril Other (See Comments)     Internal pain  . Passion Fruit Flavoring Agent (Non-Screening)   . Amoxicillin Hives  . Pineapple Itching  . Tetracyclines & Related Nausea Only     PE Today's Vitals   12/31/23 1053  BP: 132/70  Pulse: (!) 112  Temp: 98.4 F (36.9 C)  TempSrc: Oral  SpO2: 96%  Weight: 236 lb (107 kg)  Height: 5' 7.75 (1.721 m)   Body mass index is 36.15 kg/m.  Physical Exam Vitals reviewed. Exam conducted with a chaperone present.  Constitutional:      General: She is not in acute distress.    Appearance: Normal appearance.  HENT:     Head: Normocephalic and atraumatic.     Nose: Nose normal.  Eyes:     Extraocular Movements: Extraocular movements intact.     Conjunctiva/sclera: Conjunctivae normal.  Neck:     Thyroid: No thyroid mass, thyromegaly or thyroid tenderness.  Pulmonary:     Effort: Pulmonary effort is normal.  Chest:     Chest wall: No mass or tenderness.  Breasts:    Right: Normal. No swelling, mass, nipple discharge or tenderness.     Left: Normal. No swelling, mass, nipple discharge or tenderness.       Comments: Bilateral wound dehiscence with packing present, yellow discharge along right incision, no erythema, mild tenderness Abdominal:     General: There is no distension.     Palpations: Abdomen is soft.     Tenderness: There is no abdominal tenderness.  Genitourinary:    General: Normal vulva.     Exam position: Lithotomy position.     Urethra: No prolapse.     Vagina: Normal. No vaginal discharge or bleeding.     Cervix: Normal.     Adnexa: Right adnexa normal and left adnexa normal.     Comments: Uterus absent Musculoskeletal:        General: Normal range of motion.     Cervical back: Normal range of motion.  Lymphadenopathy:     Upper Body:     Right upper body: No axillary adenopathy.     Left upper body: No axillary adenopathy.     Lower Body: No right inguinal adenopathy. No left inguinal adenopathy.  Skin:    General: Skin is warm and dry.   Neurological:     General: No focal deficit present.     Mental Status: She is alert.  Psychiatric:        Mood and Affect: Mood normal.        Behavior: Behavior normal.      Assessment and Plan:        Well woman exam with routine gynecological exam  Cervical cancer screening -     Cytology - PAP  Malignant neoplasm of lower-inner quadrant of left breast in female, estrogen receptor positive (HCC)   Vera LULLA Pa, MD

## 2023-12-30 NOTE — Therapy (Incomplete)
 OUTPATIENT PHYSICAL THERAPY BREAST CANCER POST OP FOLLOW UP   Patient Name: [name] MRN: [id number] DOB:1961-02-10, 63 y.o., female Today's Date: 12/31/2023  END OF SESSION:  PT End of Session - 12/31/23 0905     Visit Number 2    Number of Visits 12    Date for Recertification  02/04/24    PT Start Time 0905    PT Stop Time 1000    PT Time Calculation (min) 55 min    Activity Tolerance Patient tolerated treatment well    Behavior During Therapy [facility] for tasks assessed/performed          Past Medical History:  Diagnosis Date   Anxiety    Asthma    Asthma due to seasonal allergies    Breast cancer (HCC) 2009   Tamoxifen, left lumpectomy   Cough    Cramps, muscle, general    Depression    Diarrhea    Diverticulosis    Eczema    Hypertension    Palpitation    Personal history of radiation therapy    Pre-diabetes    Visual disturbance    Wheezing    Past Surgical History:  Procedure Laterality Date   ABDOMINAL HYSTERECTOMY  09/10/2005   still have ovaries   BIOPSY N/A 09/08/2014   Procedure: BIOPSY random colon;  Surgeon: Margo LITTIE Haddock, MD;  Location: [facility];  Service: Endoscopy;  Laterality: N/A;   BREAST BIOPSY Right 10/2020   Breast tissue with fibrosis.  No atypia or malignancy.   BREAST BIOPSY Left 10/26/2023   US  LT BREAST BX W LOC DEV 1ST LESION IMG BX SPEC US  GUIDE 10/26/2023 [facility] MAMMOGRAPHY   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   COLONOSCOPY WITH PROPOFOL  N/A 09/08/2014   Procedure: COLONOSCOPY WITH PROPOFOL ; in cecum at 0904; withdrawal time 16 minutes;  Surgeon: Margo LITTIE Haddock, MD;  Location: [facility];  Service: Endoscopy;  Laterality: N/A;   MASTECTOMY W/ SENTINEL NODE BIOPSY Left 11/29/2023   Procedure: MASTECTOMY WITH SENTINEL LYMPH NODE BIOPSY;  Surgeon: Vanderbilt Ned, MD;  Location: [facility];  Service: General;  Laterality:  Left;  GEN w/PEC BLOCK LEFT SIMPLE MASTECTOMY LEFT SENTINEL LYMPH NODE MAPPING   TOTAL MASTECTOMY Right 11/29/2023   Procedure: MASTECTOMY, SIMPLE;  Surgeon: Vanderbilt Ned, MD;  Location: [facility];  Service: General;  Laterality: Right;  RIGHT RISK REDUCING MASTECTOMY   Patient Active Problem List   Diagnosis Date Noted   Genetic testing 11/19/2023   Malignant neoplasm of lower-inner quadrant of left breast in female, estrogen receptor positive (HCC) 11/05/2023   Non-pitting edema 09/24/2023   Generalized rash 07/04/2023   Morbid obesity (HCC) 05/06/2023   Prediabetes 10/04/2022   Anxiety and depression 09/04/2022   Asthma 09/04/2022   Essential hypertension 09/04/2022   Hyperlipidemia 09/04/2022   Hx of migraines 09/04/2022   Abdominal cramping 09/04/2022   History of breast cancer 10/06/2021   Hot flashes 10/06/2021   Abdominal bloating 10/05/2020   Encounter for screening fecal occult blood testing 10/05/2020   Encounter for gynecological examination with Papanicolaou smear of cervix 10/05/2020   S/P abdominal supracervical subtotal hysterectomy 10/05/2020   Encounter for screening colonoscopy 08/21/2014   Anxiety state 12/13/2009   CHEST PAIN 12/13/2009   Breast cancer (HCC) 2009    PCP:    REFERRING PROVIDER: Dr. Ned Vanderbilt   REFERRING DIAG: Left breast  cancer   THERAPY DIAG:  Malignant neoplasm of lower-inner quadrant of left breast in female, estrogen receptor positive (HCC)  Abnormal posture  History of ductal carcinoma in situ (DCIS) of left breast  Stiffness of right shoulder, not elsewhere classified  Stiffness of left shoulder, not elsewhere classified  Localized edema  Rationale for Evaluation and Treatment: Rehabilitation  ONSET DATE: 10/15/2023   SUBJECTIVE:                                                                                                                                                                                            SUBJECTIVE STATEMENT: I have done some of the exercises. I feel like ROM is doing well. I have a wound 1 on each side that I am packing and wrapping. Dr. Vanderbilt said it was fine for me to be here. Fluid does soak through at times. I started the Letrozole last week and I have had more pain at night everywhere. I had a fever last Thursday so he put me on an antibiotic just to be sure there is no infection.  PERTINENT  HISTORY:  Patient was diagnosed on 10/15/2023 with left grade 2 invasive ductal carcinoma breast cancer. It measures 1.3 cm and is located in the lower inner quadrant. It is ER/PR positive and HER2 negative with a Ki67 of 5%. She has a history of left breast DCIS in 2009 and was treated with a lumpectomy and radiation. She is s/p Bilateral Mastectomies with left SLNB and 0+/3 LN's on 11/29/2023. She has had some difficulties with wound healing and was packing one wound each side.  PATIENT GOALS:  Reassess how my recovery is going related to arm function, pain, and swelling.  PAIN:  Are you having pain? Yes: NPRS scale: 1/10 present , worst 9/10 (whole body) 1 night Pain location: left arm Pain description: slow permeating, achiness Aggravating factors: inactivity Relieving factors: tylenol ,movement  PRECAUTIONS: Recent Surgery, left UE Lymphedema risk,   RED FLAGS: None   ACTIVITY LEVEL / LEISURE: resumed laundry, fixing food for her 68 yr old dad,loading and unloading dishwasher.   OBJECTIVE:   PATIENT SURVEYS:  QUICK DASH: 18%  OBSERVATIONS: 2.4 cm wound left chest area currently packed (see photo in media), right chest multiple scabs and smaller healing wound lightly packed and covered with abd pad.  POSTURE:  Forward head and rounded shoulders posture   LYMPHEDEMA ASSESSMENT:   UPPER EXTREMITY AROM/PROM:   A/PROM RIGHT   eval   RIGHT 12/31/2023  Shoulder extension 43 50  Shoulder flexion 155 103, tight  Shoulder abduction 157 105 tight   Shoulder internal rotation  77 70  Shoulder external rotation 84 105                          (Blank rows = not tested)   A/PROM LEFT   eval LEFT 12/31/2023  Shoulder extension 46 50  Shoulder flexion 147 132  Shoulder abduction 161 145  Shoulder internal rotation 75 75  Shoulder external rotation 90 105                          (Blank rows = not tested)   CERVICAL AROM: All within normal limits   UPPER EXTREMITY STRENGTH: WNL   LYMPHEDEMA ASSESSMENTS (in cm):    LANDMARK RIGHT   eval RIGHT 12/31/2023  10 cm proximal to olecranon process 38 37  Olecranon process 28.5 28.2  10 cm proximal to ulnar styloid process 24.5 23.3  Just proximal to ulnar styloid process 15.1 15.4  Across hand at thumb web space 17.6 19.5  At base of 2nd digit 6.2 6.3  (Blank rows = not tested)   LANDMARK LEFT   eval LEFT 12/31/2023  10 cm proximal to olecranon process 38.4 38  Olecranon process 28 28.1  10 cm proximal to ulnar styloid process 22.2 22.4  Just proximal to ulnar styloid process 15.4 15.4  Across hand at thumb web space 19.4 19.7  At base of 2nd digit 6.4 6.4  (Blank rows = not tested)     Surgery type/Date: 11/29/2023 Bilateral Mastectomy, 09/25/2007 Left Lumpectomy for DCIs Number of lymph nodes removed: 0+/3 Current/past treatment (chemo, radiation, hormone therapy): Had prior radiation with lumpectomy in 2009, Took Tamoxifen 5 yrs after lumpectomy. Will start Letrozole Other symptoms:  Heaviness/tightness Yes, chest, arm Pain Yes Pitting edema No Infections on antibiotics due to fever 1 day Decreased scar mobility Yes Stemmer sign No  PATIENT EDUCATION:  Education details: SOZO screens, need to hold scar massage until healed, have brother check wound area while exercising to be sure no opening of wound area during exs.(none noted today), ABC video watch and we will discuss Person educated: Patient Education method: Explanation, Demonstration, and Handouts Education  comprehension: verbalized understanding and returned demonstration  HOME EXERCISE PROGRAM: Reviewed previously given post op HEP. Performed shoulder flexion, and stargazer x 5 in supine while observing wound area, scapular retraction, standing wall slides.   ASSESSMENT:  CLINICAL IMPRESSION: Pt is s/p Bilateral Mastectomies with left SLNB on 11/29/2023. She had a prior left lumpectomy for DCIS in 2009. She presents with limitations in shoulder ROM, Right greater than left.  She has poor wound healing and has a 2.5 cm area on the left chest that she is packing, and a smaller, more superficial wound on the right chest area, with multiple scabs noted full length.  See Photos in Media.Wound area was observed and there was no opening of wound area noted with Pt performing AAROM for flexion and stargazer. Pt will benefit from skilled therapeutic intervention to improve on the following deficits: Decreased knowledge of precautions, impaired UE functional use, pain, decreased ROM, and postural dysfunction.   PT treatment/interventions: ADL/Self care home management, [phone]- PT Re-evaluation, 97110-Therapeutic exercises, 97530- Therapeutic activity, [id number]- Neuromuscular re-education, 97535- Self Care, [id number]- Manual therapy, [id number]- Orthotic Initial, and [id number]- Orthotic/Prosthetic subsequent   GOALS: Goals reviewed with patient? Yes  GOALS MET AT EVAL:  GOALS Name Target Date Goal status  1 Pt will be able to verbalize understanding of pertinent lymphedema  risk reduction practices relevant to her dx specifically related to skin care.  Baseline:  No knowledge Eval Achieved at eval  2 Pt will be able to return demo and/or verbalize understanding of the post op HEP related to regaining shoulder ROM. Baseline:  No knowledge Eval Achieved at eval  3 Pt will be able to verbalize understanding of the importance of viewing the post op After Breast CA Class video for further lymphedema risk reduction education and  therapeutic exercise.  Baseline:  No knowledge Eval Achieved at eval   LONG TERM GOALS:  (STG=LTG)  GOALS Name Target Date  Goal status  1 Pt will demonstrate she has regained full shoulder ROM and function post operatively compared to baselines.  Baseline: 02/04/2024 INITIAL  2 Pt will watch ABC video and will have all questions answered 02/04/2024 INITIAL  3 Pt will be independent with HEP to improve ROM and gentle strength of bilateral UE's 02/04/2024 INITIAL     PLAN:  PT FREQUENCY/DURATION: 2x/week x 5 weeks  PLAN FOR NEXT SESSION: recheck wound area and watch for opening with ROM activities, PROM, STM prn, MLD prn   [facility]  [address], [address]  [place] [address]  [phone]  After Breast Cancer Class Video It is recommended you view the ABC class video to be educated on lymphedema risk reduction. This video lasts for about 30 minutes. It can be viewed on our website here: [email or web address]  Scar massage You can begin gentle scar massage to you incision sites. Gently place one hand on the incision and move the skin (without sliding on the skin) in various directions. Do this for a few minutes and then you can gently massage either coconut oil or vitamin E cream into the scars.  Compression garment You should continue wearing your compression bra until you feel like you no longer have swelling.  Home exercise Program Continue doing the exercises you were given until you feel like you can do them without feeling any tightness at the end.   Walking Program Studies show that 30 minutes of walking per day (fast enough to elevate your heart rate) can significantly reduce the risk of a cancer recurrence. If you can't walk due to other medical reasons, we encourage you to find another activity you could do (like a stationary bike or water  exercise).  Posture After breast  cancer surgery, people frequently sit with rounded shoulders posture because it puts their incisions on slack and feels better. If you sit like this and scar tissue forms in that position, you can become very tight and have pain sitting or standing with good posture. Try to be aware of your posture and sit and stand up tall to heal properly.  Follow up PT: It is recommended you return every 3 months for the first 3 years following surgery to be assessed on the SOZO machine for an L-Dex score. This helps prevent clinically significant lymphedema in 95% of patients. These follow up screens are 10 minute appointments that you are not billed for.  Grayce JINNY Sheldon, PT 12/31/2023, 2:39 PM

## 2023-12-31 ENCOUNTER — Encounter: Payer: Self-pay | Admitting: Obstetrics and Gynecology

## 2023-12-31 ENCOUNTER — Telehealth: Payer: Self-pay

## 2023-12-31 ENCOUNTER — Other Ambulatory Visit: Payer: Self-pay

## 2023-12-31 ENCOUNTER — Ambulatory Visit (INDEPENDENT_AMBULATORY_CARE_PROVIDER_SITE_OTHER): Admitting: Obstetrics and Gynecology

## 2023-12-31 ENCOUNTER — Ambulatory Visit: Attending: Surgery

## 2023-12-31 ENCOUNTER — Other Ambulatory Visit (HOSPITAL_COMMUNITY)
Admission: RE | Admit: 2023-12-31 | Discharge: 2023-12-31 | Disposition: A | Discharge status: Home / Self Care | Source: Ambulatory Visit | Attending: Obstetrics and Gynecology | Admitting: Obstetrics and Gynecology

## 2023-12-31 VITALS — BP 132/70 | HR 112 | Temp 98.4°F | Ht 67.75 in | Wt 236.0 lb

## 2023-12-31 DIAGNOSIS — R600 Localized edema: Secondary | ICD-10-CM | POA: Insufficient documentation

## 2023-12-31 DIAGNOSIS — C50312 Malignant neoplasm of lower-inner quadrant of left female breast: Secondary | ICD-10-CM | POA: Diagnosis present

## 2023-12-31 DIAGNOSIS — E669 Obesity, unspecified: Secondary | ICD-10-CM

## 2023-12-31 DIAGNOSIS — Z124 Encounter for screening for malignant neoplasm of cervix: Secondary | ICD-10-CM

## 2023-12-31 DIAGNOSIS — Z170 Estrogen receptor positive status [ER+]: Secondary | ICD-10-CM

## 2023-12-31 DIAGNOSIS — M25611 Stiffness of right shoulder, not elsewhere classified: Secondary | ICD-10-CM | POA: Insufficient documentation

## 2023-12-31 DIAGNOSIS — Z1331 Encounter for screening for depression: Secondary | ICD-10-CM

## 2023-12-31 DIAGNOSIS — Z86000 Personal history of in-situ neoplasm of breast: Secondary | ICD-10-CM | POA: Diagnosis present

## 2023-12-31 DIAGNOSIS — Z01419 Encounter for gynecological examination (general) (routine) without abnormal findings: Secondary | ICD-10-CM

## 2023-12-31 DIAGNOSIS — Z90711 Acquired absence of uterus with remaining cervical stump: Secondary | ICD-10-CM

## 2023-12-31 DIAGNOSIS — M25612 Stiffness of left shoulder, not elsewhere classified: Secondary | ICD-10-CM | POA: Insufficient documentation

## 2023-12-31 DIAGNOSIS — R293 Abnormal posture: Secondary | ICD-10-CM | POA: Insufficient documentation

## 2023-12-31 MED ORDER — TIRZEPATIDE-WEIGHT MANAGEMENT 15 MG/0.5ML ~~LOC~~ SOAJ: 15.0000 mg | SUBCUTANEOUS | 1 refills | Status: DC

## 2023-12-31 NOTE — Telephone Encounter (Signed)
 Copied from CRM #[id number]. Topic: Clinical - Medication Question >> Dec 31, 2023  1:50 PM Precious C wrote: Reason for CRM: Patient called in stating she is currently taking Zepbound  12.5 mg. She believes it is time to increase her dosage and is requesting that her provider send the next higher dosage to her pharmacy. Patient reported that [facility], her preferred pharmacy, advised her to call in to request a refill. However, since she is requesting an increased dosage, she would like the higher dosage prescription to be sent instead. Patient also requested confirmation through MyChart once the updated prescription has been sent.   [facility] [address] [place] - [address] [place] [address] Phone: [phone] Fax: [phone]

## 2023-12-31 NOTE — Patient Instructions (Signed)
 [facility]  [address], [address]  [place] [address]  [phone]  After Breast Cancer Class Video It is recommended you view the ABC class video to be educated on lymphedema risk reduction. This video lasts for about 30 minutes. It can be viewed on our website here: [email or web address]  Scar massage You can begin gentle scar massage to you incision sites. Gently place one hand on the incision and move the skin (without sliding on the skin) in various directions. Do this for a few minutes and then you can gently massage either coconut oil or vitamin E cream into the scars.  Compression garment You should continue wearing your compression bra until you feel like you no longer have swelling.  Home exercise Program Continue doing the exercises you were given until you feel like you can do them without feeling any tightness at the end.   Walking Program Studies show that 30 minutes of walking per day (fast enough to elevate your heart rate) can significantly reduce the risk of a cancer recurrence. If you can't walk due to other medical reasons, we encourage you to find another activity you could do (like a stationary bike or water  exercise).  Posture After breast cancer surgery, people frequently sit with rounded shoulders posture because it puts their incisions on slack and feels better. If you sit like this and scar tissue forms in that position, you can become very tight and have pain sitting or standing with good posture. Try to be aware of your posture and sit and stand up tall to heal properly.  Follow up PT: It is recommended you return every 3 months for the first 3 years following surgery to be assessed on the SOZO machine for an L-Dex score. This helps prevent clinically significant lymphedema in 95% of patients. These follow up screens are 10 minute appointments that you are not billed  for.  Grayce JINNY Sheldon, PT 12/31/2023, 9:05 AM

## 2023-12-31 NOTE — Telephone Encounter (Signed)
 Zepbound  15 mg sent to [facility] in [place]

## 2024-01-01 ENCOUNTER — Telehealth: Payer: Self-pay | Admitting: Pharmacy Technician

## 2024-01-01 LAB — CYTOLOGY - PAP
Comment: NEGATIVE
Diagnosis: NEGATIVE
High risk HPV: NEGATIVE

## 2024-01-01 NOTE — Telephone Encounter (Signed)
 Pharmacy Patient Advocate Encounter   Received notification from CoverMyMeds that prior authorization for Zepbound  15MG /0.5ML pen-injectors is required/requested.   Insurance verification completed.   The patient is insured through [facility] MEDICAID.   Effective October 1st, Medicaid has discontinued coverage of GLP1 medications for weight loss (such as Wegovy  and Zepbound ), unless the patient has a documented history of a heart attack or stroke and 42 years of age or older. Zepbound  will continue to be covered only for patients with moderate to severe sleep apnea (AHI 15-30) and a BMI greater than 40.

## 2024-01-01 NOTE — Telephone Encounter (Signed)
 Patient advised.

## 2024-01-02 ENCOUNTER — Other Ambulatory Visit: Payer: Self-pay

## 2024-01-02 ENCOUNTER — Ambulatory Visit: Payer: Self-pay | Admitting: Obstetrics and Gynecology

## 2024-01-02 MED ORDER — ROSUVASTATIN CALCIUM 20 MG PO TABS: 20.0000 mg | ORAL_TABLET | Freq: Every day | ORAL | 0 refills | Status: DC

## 2024-01-03 ENCOUNTER — Telehealth: Payer: Self-pay

## 2024-01-03 DIAGNOSIS — Z01419 Encounter for gynecological examination (general) (routine) without abnormal findings: Secondary | ICD-10-CM | POA: Insufficient documentation

## 2024-01-03 NOTE — Telephone Encounter (Signed)
 PA request has not been Started. New Encounter has been or will be created for follow up. For additional info see Pharmacy Prior Auth telephone encounter from 01/01/2024.  She does not meet the new coverage criteria that went into effect on 12/12/2023.

## 2024-01-03 NOTE — Assessment & Plan Note (Addendum)
 Cervical cancer screening performed according to ASCCP guidelines. MMG- s/p BL mastectomy, with recent recurrent breast cancer Colonoscopy UTD DXA scheduled by oncology Labs and immunizations with her primary Encouraged safe sexual practices as indicated Encouraged healthy lifestyle practices with diet and exercise For patients under 50-61yo, I recommend 1200mg  calcium  daily and 600IU of vitamin D  daily.

## 2024-01-03 NOTE — Patient Instructions (Signed)
 For patients under 50-61yo, I recommend 1200mg  calcium  daily and 600IU of vitamin D daily. For patients over 61yo, I recommend 1200mg  calcium  daily and 800IU of vitamin D daily.  Health Maintenance, Female Adopting a healthy lifestyle and getting preventive care are important in promoting health and wellness. Ask your health care provider about: The right schedule for you to have regular tests and exams. Things you can do on your own to prevent diseases and keep yourself healthy. What should I know about diet, weight, and exercise? Eat a healthy diet  Eat a diet that includes plenty of vegetables, fruits, low-fat dairy products, and lean protein. Do not eat a lot of foods that are high in solid fats, added sugars, or sodium. Maintain a healthy weight Body mass index (BMI) is used to identify weight problems. It estimates body fat based on height and weight. Your health care provider can help determine your BMI and help you achieve or maintain a healthy weight. Get regular exercise Get regular exercise. This is one of the most important things you can do for your health. Most adults should: Exercise for at least 150 minutes each week. The exercise should increase your heart rate and make you sweat (moderate-intensity exercise). Do strengthening exercises at least twice a week. This is in addition to the moderate-intensity exercise. Spend less time sitting. Even light physical activity can be beneficial. Watch cholesterol and blood lipids Have your blood tested for lipids and cholesterol at 61 years of age, then have this test every 5 years. Have your cholesterol levels checked more often if: Your lipid or cholesterol levels are high. You are older than 61 years of age. You are at high risk for heart disease. What should I know about cancer screening? Depending on your health history and family history, you may need to have cancer screening at various ages. This may include screening  for: Breast cancer. Cervical cancer. Colorectal cancer. Skin cancer. Lung cancer. What should I know about heart disease, diabetes, and high blood pressure? Blood pressure and heart disease High blood pressure causes heart disease and increases the risk of stroke. This is more likely to develop in people who have high blood pressure readings or are overweight. Have your blood pressure checked: Every 3-5 years if you are 25-57 years of age. Every year if you are 24 years old or older. Diabetes Have regular diabetes screenings. This checks your fasting blood sugar level. Have the screening done: Once every three years after age 62 if you are at a normal weight and have a low risk for diabetes. More often and at a younger age if you are overweight or have a high risk for diabetes. What should I know about preventing infection? Hepatitis B If you have a higher risk for hepatitis B, you should be screened for this virus. Talk with your health care provider to find out if you are at risk for hepatitis B infection. Hepatitis C Testing is recommended for: Everyone born from [birth year] through 1963. Anyone with known risk factors for hepatitis C. Sexually transmitted infections (STIs) Get screened for STIs, including gonorrhea and chlamydia, if: You are sexually active and are younger than 61 years of age. You are older than 61 years of age and your health care provider tells you that you are at risk for this type of infection. Your sexual activity has changed since you were last screened, and you are at increased risk for chlamydia or gonorrhea. Ask your health care provider if  you are at risk. Ask your health care provider about whether you are at high risk for HIV. Your health care provider may recommend a prescription medicine to help prevent HIV infection. If you choose to take medicine to prevent HIV, you should first get tested for HIV. You should then be tested every 3 months for as long as you  are taking the medicine. Osteoporosis and menopause Osteoporosis is a disease in which the bones lose minerals and strength with aging. This can result in bone fractures. If you are 72 years old or older, or if you are at risk for osteoporosis and fractures, ask your health care provider if you should: Be screened for bone loss. Take a calcium  or vitamin D supplement to lower your risk of fractures. Be given hormone replacement therapy (HRT) to treat symptoms of menopause. Follow these instructions at home: Alcohol use Do not drink alcohol if: Your health care provider tells you not to drink. You are pregnant, may be pregnant, or are planning to become pregnant. If you drink alcohol: Limit how much you have to: 0-1 drink a day. Know how much alcohol is in your drink. In the [place] one drink equals one 12 oz bottle of beer (355 mL), one 5 oz glass of wine (148 mL), or one 1 oz glass of hard liquor (44 mL). Lifestyle Do not use any products that contain nicotine or tobacco. These products include cigarettes, chewing tobacco, and vaping devices, such as e-cigarettes. If you need help quitting, ask your health care provider. Do not use street drugs. Do not share needles. Ask your health care provider for help if you need support or information about quitting drugs. General instructions Schedule regular health, dental, and eye exams. Stay current with your vaccines. Tell your health care provider if: You often feel depressed. You have ever been abused or do not feel safe at home. Summary Adopting a healthy lifestyle and getting preventive care are important in promoting health and wellness. Follow your health care provider's instructions about healthy diet, exercising, and getting tested or screened for diseases. Follow your health care provider's instructions on monitoring your cholesterol and blood pressure. This information is not intended to replace advice given to you by your health  care provider. Make sure you discuss any questions you have with your health care provider. Document Revised: 07/19/2020 Document Reviewed: 07/19/2020 Elsevier Patient Education  2024 [employer].

## 2024-01-03 NOTE — Telephone Encounter (Signed)
 Copied from CRM #[id number]. Topic: Clinical - Medication Prior Auth >> Jan 03, 2024  1:46 PM Charlet HERO wrote: Reason for CRM: The patient is stating that the pharmacy told her that she would need prior auth for zepbound  15 mg.

## 2024-01-04 ENCOUNTER — Ambulatory Visit

## 2024-01-04 ENCOUNTER — Encounter: Payer: Self-pay | Admitting: Family Medicine

## 2024-01-04 ENCOUNTER — Ambulatory Visit: Admitting: Family Medicine

## 2024-01-04 VITALS — BP 135/81 | HR 113 | Ht 69.0 in | Wt 238.0 lb

## 2024-01-04 DIAGNOSIS — M25611 Stiffness of right shoulder, not elsewhere classified: Secondary | ICD-10-CM

## 2024-01-04 DIAGNOSIS — C50312 Malignant neoplasm of lower-inner quadrant of left female breast: Secondary | ICD-10-CM

## 2024-01-04 DIAGNOSIS — Z9013 Acquired absence of bilateral breasts and nipples: Secondary | ICD-10-CM

## 2024-01-04 DIAGNOSIS — R600 Localized edema: Secondary | ICD-10-CM

## 2024-01-04 DIAGNOSIS — R293 Abnormal posture: Secondary | ICD-10-CM

## 2024-01-04 DIAGNOSIS — M25612 Stiffness of left shoulder, not elsewhere classified: Secondary | ICD-10-CM

## 2024-01-04 DIAGNOSIS — Z86000 Personal history of in-situ neoplasm of breast: Secondary | ICD-10-CM

## 2024-01-04 NOTE — Progress Notes (Signed)
 Established Patient Office Visit  Subjective:  Patient ID: [name], female    DOB: [birth year]  Age: 63 y.o. MRN: [id number]  CC:  Chief Complaint  Patient presents with   Hospitalization Follow-up    HPI [name] is a 63 y.o. female with past medical history of  HTN, Hyperlipidemia, prediabetes, presents for f/u of chronic medical conditions.  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 She has a prior history of left breast DCIS (2009) treated with lumpectomy, radiation, and tamoxifen for 5 years.  She underwent bilateral mastectomies with left sentinel lymph node biopsy (0/3 positive) on 11/29/2023. At oncology follow-up on 12/24/2023, no further chemotherapy or radiation was required as all cancer was fully removed. She was started on letrozole (Femara) 2.5 mg daily for 5 years.  Currently, she reports mild fatigue and soreness, more on the right side. The right wound is healing well; the left remains larger and requires more packing.   Past Medical History:  Diagnosis Date   Anxiety    Asthma    Asthma due to seasonal allergies    Breast cancer (HCC) 2009   Tamoxifen, left lumpectomy   Cough    Cramps, muscle, general    Depression    Diarrhea    Diverticulosis    Eczema    Hypertension    Palpitation    Personal history of radiation therapy    Pre-diabetes    Visual disturbance    Wheezing     Past Surgical History:  Procedure Laterality Date   ABDOMINAL HYSTERECTOMY  09/10/2005   still have ovaries   BIOPSY N/A 09/08/2014   Procedure: BIOPSY random colon;  Surgeon: Margo LITTIE Haddock, MD;  Location: [facility];  Service: Endoscopy;  Laterality: N/A;   BREAST BIOPSY Right 10/2020   Breast tissue with fibrosis.  No atypia or malignancy.   BREAST BIOPSY Left 10/26/2023   US  LT BREAST BX W LOC DEV 1ST LESION IMG BX SPEC US   GUIDE 10/26/2023 [facility]MO[facility]   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   COLONOSCOPY WITH PROPOFOL  N/A 09/08/2014   Procedure: COLONOSCOPY WITH PROPOFOL ; in cecum at 0904; withdrawal time 16 minutes;  Surgeon: Margo LITTIE Haddock, MD;  Location: [facility];  Service: Endoscopy;  Laterality: N/A;   MASTECTOMY W/ SENTINEL NODE BIOPSY Left 11/29/2023   Procedure: MASTECTOMY WITH SENTINEL LYMPH NODE BIOPSY;  Surgeon: Vanderbilt Ned, MD;  Location: [facility];  Service: General;  Laterality: Left;  GEN w/PEC BLOCK LEFT SIMPLE MASTECTOMY LEFT SENTINEL LYMPH NODE MAPPING   TOTAL MASTECTOMY Right 11/29/2023   Procedure: MASTECTOMY, SIMPLE;  Surgeon: Vanderbilt Ned, MD;  Location: [facility];  Service: General;  Laterality: Right;  RIGHT RISK REDUCING MASTECTOMY    Family History  Problem Relation Age of Onset   Diabetes Mother    Stroke Mother    Hypertension Mother    Other Mother 55       Smoldering myeloma   Macular degeneration Mother    Congestive Heart Failure Mother    Diabetes Father    Hypertension Sister    Hypertension Brother    Prostate cancer Brother 2   Prostate cancer Brother 32   Prostate cancer Maternal Uncle 49   Ovarian cancer Paternal Aunt  Breast cancer Paternal Aunt 82   Breast cancer Niece 28       bilateral   Lung cancer Paternal Cousin        smoked, first cousin   Breast cancer Paternal Cousin 37       first cousin once removed   Pancreatic cancer Paternal Cousin    Prostate cancer Maternal Cousin 90       first cousin once removed    Social History   Socioeconomic History   Marital status: Divorced    Spouse name: Not on file   Number of children: Not on file   Years of education: Not on file   Highest education level: Not on file  Occupational History   Occupation: Social Services  Tobacco Use   Smoking status: Former     Current packs/day: 0.00    Types: Cigarettes    Quit date: 03/02/1990    Years since quitting: 33.8   Smokeless tobacco: Never  Vaping Use   Vaping status: Never Used  Substance and Sexual Activity   Alcohol use: No    Alcohol/week: 0.0 standard drinks of alcohol   Drug use: No   Sexual activity: Not Currently    Birth control/protection: Surgical    Comment: hyst  Other Topics Concern   Not on file  Social History Narrative   Not on file   Social Drivers of Health   Financial Resource Strain: Low Risk  (10/05/2020)   Overall Financial Resource Strain (CARDIA)    Difficulty of Paying Living Expenses: Not very hard  Food Insecurity: No Food Insecurity (11/07/2023)   Hunger Vital Sign    Worried About Running Out of Food in the Last Year: Never true    Ran Out of Food in the Last Year: Never true  Transportation Needs: No Transportation Needs (11/07/2023)   PRAPARE - Administrator, Civil Service (Medical): No    Lack of Transportation (Non-Medical): No  Physical Activity: Insufficiently Active (10/05/2020)   Exercise Vital Sign    Days of Exercise per Week: 5 days    Minutes of Exercise per Session: 20 min  Stress: Stress Concern Present (10/05/2020)   [employer] of Occupational Health - Occupational Stress Questionnaire    Feeling of Stress : To some extent  Social Connections: Moderately Integrated (10/05/2020)   Social Connection and Isolation Panel    Frequency of Communication with Friends and Family: Twice a week    Frequency of Social Gatherings with Friends and Family: Twice a week    Attends Religious Services: More than 4 times per year    Active Member of [employer] or Organizations: Yes    Attends Engineer, Structural: More than 4 times per year    Marital Status: Divorced  Intimate Partner Violence: Not At Risk (11/07/2023)   Humiliation, Afraid, Rape, and Kick questionnaire    Fear of Current or Ex-Partner: No    Emotionally Abused: No     Physically Abused: No    Sexually Abused: No    Outpatient Medications Prior to Visit  Medication Sig Dispense Refill   albuterol  (PROVENTIL ) (2.5 MG/3ML) 0.083% nebulizer solution Take 3 mLs (2.5 mg total) by nebulization every 6 (six) hours as needed for wheezing or shortness of breath. 75 mL 1   albuterol  (VENTOLIN  HFA) 108 (90 Base) MCG/ACT inhaler Inhale 2 puffs into the lungs every 6 (six) hours as needed for wheezing or shortness of breath. 1 each 1   Albuterol -Budesonide  (AIRSUPRA )  90-80 MCG/ACT AERO Inhale 2 puffs into the lungs every 4 (four) hours as needed. 10.7 g 5   Albuterol -Budesonide  (AIRSUPRA ) 90-80 MCG/ACT AERO Inhale 2 puffs into the lungs every 4 (four) hours as needed.     ALPHA LIPOIC ACID PO Take by mouth.     aspirin  EC 81 MG tablet Take 1 tablet (81 mg total) by mouth daily. Swallow whole. 90 tablet 3   clobetasol  ointment (TEMOVATE ) 0.05 % APPLY 1 APPLICATION TOPICALLY TWO TIMES DAILY. USE FOR 2- 3 WEEKS TOPS. 30 g 0   DULoxetine  (CYMBALTA ) 60 MG capsule Take 1 capsule by mouth once daily 90 capsule 0   ELDERBERRY PO Take by mouth.     EPINEPHrine  0.3 mg/0.3 mL IJ SOAJ injection Inject 0.3 mg into the muscle as needed for anaphylaxis. 2 each 1   Glucosamine HCl (GLUCOSAMINE PO) Take by mouth.     hydrochlorothiazide  (HYDRODIURIL ) 50 MG tablet Take 1 tablet by mouth once daily 90 tablet 0   ibuprofen  (ADVIL ) 800 MG tablet Take 1 tablet (800 mg total) by mouth every 8 (eight) hours as needed. 30 tablet 0   letrozole (FEMARA) 2.5 MG tablet Take 1 tablet (2.5 mg total) by mouth daily. 90 tablet 3   LORazepam  (ATIVAN ) 0.5 MG tablet TAKE 1 TABLET BY MOUTH EVERY 8 HOURS AS NEEDED FOR ANXIETY 90 tablet 0   LYSINE PO Take by mouth.     methocarbamol  (ROBAXIN ) 500 MG tablet Take 1 tablet (500 mg total) by mouth every 8 (eight) hours as needed for muscle spasms. 15 tablet 0   Multiple Vitamins-Minerals (MULTIVITAMIN WITH MINERALS) tablet Take 1 tablet by mouth daily. Vitamin D3  is in it (1,000Ius)     OIL OF OREGANO PO Take by mouth.     Olopatadine  HCl (PATADAY ) 0.2 % SOLN Place 1 drop into both eyes 1 day or 1 dose. (Patient not taking: Reported on 12/31/2023) 2.5 mL 5   OVER THE COUNTER MEDICATION Take by mouth daily. Sugar Blockers     oxyCODONE  (OXY IR/ROXICODONE ) 5 MG immediate release tablet Take 1 tablet (5 mg total) by mouth every 6 (six) hours as needed for severe pain (pain score 7-10). 15 tablet 0   potassium chloride SA (K-DUR,KLOR-CON) 20 MEQ tablet Take 20 mEq by mouth daily.     Probiotic Product (PROBIOTIC-10 PO) Take 10 mg by mouth daily. Prebiotic/Probiotic     rosuvastatin  (CRESTOR ) 20 MG tablet Take 1 tablet (20 mg total) by mouth at bedtime. 90 tablet 0   sulfamethoxazole-trimethoprim (BACTRIM DS) 800-160 MG tablet SMARTSIG:1 Tablet(s) By Mouth Every 12 Hours     tirzepatide  (ZEPBOUND ) 15 MG/0.5ML Pen Inject 15 mg into the skin once a week. 6 mL 1   No facility-administered medications prior to visit.    Allergies  Allergen Reactions   Ceclor [Cefaclor] Other (See Comments)    Causes pain in esophagus   Erythromycin Hives and Itching   Green Tea (Camellia Sinensis) Other (See Comments)    Eyes swell   Latex     Welts    Other Itching    pineapple   Penicillins Hives and Itching   Tetracycline Hives and Itching   Lisinopril Other (See Comments)    Internal pain   Passion Fruit Flavoring Agent (Non-Screening)    Amoxicillin Hives   Pineapple Itching   Tetracyclines & Related Nausea Only    ROS Review of Systems  Constitutional:  Negative for chills and fever.  Eyes:  Negative for  visual disturbance.  Respiratory:  Negative for chest tightness and shortness of breath.   Skin:  Positive for wound.  Neurological:  Negative for dizziness and headaches.      Objective:    Physical Exam HENT:     Head: Normocephalic.     Mouth/Throat:     Mouth: Mucous membranes are moist.  Cardiovascular:     Rate and Rhythm: Normal rate.      Heart sounds: Normal heart sounds.  Pulmonary:     Effort: Pulmonary effort is normal.     Breath sounds: Normal breath sounds.  Skin:    Comments: Wound healing well  Neurological:     Mental Status: She is alert.     BP 135/81   Pulse (!) 113   Ht 5' 9 (1.753 m)   Wt 238 lb (108 kg)   SpO2 97%   BMI 35.15 kg/m  Wt Readings from Last 3 Encounters:  01/04/24 238 lb (108 kg)  12/31/23 236 lb (107 kg)  12/24/23 239 lb 12.8 oz (108.8 kg)    Lab Results  Component Value Date   TSH 0.992 09/24/2023   Lab Results  Component Value Date   WBC 7.1 01/04/2024   HGB 13.7 01/04/2024   HCT 42.2 01/04/2024   MCV 88 01/04/2024   PLT 345 01/04/2024   Lab Results  Component Value Date   NA 142 01/04/2024   K 4.1 01/04/2024   CHLORIDE 106 02/21/2013   CO2 23 01/04/2024   GLUCOSE 87 01/04/2024   BUN 18 01/04/2024   CREATININE 0.86 01/04/2024   BILITOT 0.4 11/07/2023   ALKPHOS 75 11/07/2023   AST 20 11/07/2023   ALT 17 11/07/2023   PROT 7.1 11/07/2023   ALBUMIN 4.1 11/07/2023   CALCIUM  9.7 01/04/2024   ANIONGAP 9 11/07/2023   EGFR 77 01/04/2024   Lab Results  Component Value Date   CHOL 219 (H) 09/24/2023   Lab Results  Component Value Date   HDL 59 09/24/2023   Lab Results  Component Value Date   LDLCALC 148 (H) 09/24/2023   Lab Results  Component Value Date   TRIG 67 09/24/2023   Lab Results  Component Value Date   CHOLHDL 3.7 09/24/2023   Lab Results  Component Value Date   HGBA1C 5.8 (H) 08/03/2023      Assessment & Plan:  S/P mastectomy, bilateral Assessment & Plan: Labs and imaging studies reviewed Discussed Wound Care: Clean with saline solution, pack with plain sterile packing strip using sterile cotton-tipped applicator, apply sterile gauze and brown Telfa, then place a compression bra with maxi pads over the wound sites overnight for drainage. Encouraged to continue follow up with PT and oncology as scheduled   Orders: -     AMB  referral to wound care center -     CBC with Differential/Platelet -     BMP8+EGFR  Note: This chart has been completed using Engineer, Civil (consulting) software, and while attempts have been made to ensure accuracy, certain words and phrases may not be transcribed as intended.    Follow-up: No follow-ups on file.   Krystin Keeven  Z Bacchus, FNP

## 2024-01-04 NOTE — Therapy (Signed)
 OUTPATIENT PHYSICAL THERAPY BREAST CANCER POST OP FOLLOW UP   Patient Name: [name] MRN: [id number] DOB:[birth year], 63 y.o., female Today's Date: 01/04/2024  END OF SESSION:  PT End of Session - 01/04/24 0758     Visit Number 3    Number of Visits 12    Date for Recertification  02/04/24    Authorization Type Medicaid    Authorization Time Period 10/20-12/19/2025    Authorization - Visit Number 2    Authorization - Number of Visits 12    PT Start Time 0801    PT Stop Time 0851    PT Time Calculation (min) 50 min    Activity Tolerance Patient tolerated treatment well    Behavior During Therapy [facility] for tasks assessed/performed          Past Medical History:  Diagnosis Date   Anxiety    Asthma    Asthma due to seasonal allergies    Breast cancer (HCC) 2009   Tamoxifen, left lumpectomy   Cough    Cramps, muscle, general    Depression    Diarrhea    Diverticulosis    Eczema    Hypertension    Palpitation    Personal history of radiation therapy    Pre-diabetes    Visual disturbance    Wheezing    Past Surgical History:  Procedure Laterality Date   ABDOMINAL HYSTERECTOMY  09/10/2005   still have ovaries   BIOPSY N/A 09/08/2014   Procedure: BIOPSY random colon;  Surgeon: Margo LITTIE Haddock, MD;  Location: [facility];  Service: Endoscopy;  Laterality: N/A;   BREAST BIOPSY Right 10/2020   Breast tissue with fibrosis.  No atypia or malignancy.   BREAST BIOPSY Left 10/26/2023   US  LT BREAST BX W LOC DEV 1ST LESION IMG BX SPEC US  GUIDE 10/26/2023 [facility] MAMMOGRAPHY   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   COLONOSCOPY WITH PROPOFOL  N/A 09/08/2014   Procedure: COLONOSCOPY WITH PROPOFOL ; in cecum at 0904; withdrawal time 16 minutes;  Surgeon: Margo LITTIE Haddock, MD;  Location: [facility];  Service: Endoscopy;  Laterality: N/A;   MASTECTOMY W/ SENTINEL NODE BIOPSY Left 11/29/2023    Procedure: MASTECTOMY WITH SENTINEL LYMPH NODE BIOPSY;  Surgeon: Vanderbilt Ned, MD;  Location: [facility];  Service: General;  Laterality: Left;  GEN w/PEC BLOCK LEFT SIMPLE MASTECTOMY LEFT SENTINEL LYMPH NODE MAPPING   TOTAL MASTECTOMY Right 11/29/2023   Procedure: MASTECTOMY, SIMPLE;  Surgeon: Vanderbilt Ned, MD;  Location: [facility];  Service: General;  Laterality: Right;  RIGHT RISK REDUCING MASTECTOMY   Patient Active Problem List   Diagnosis Date Noted   Well woman exam with routine gynecological exam 01/03/2024   Genetic testing 11/19/2023   Malignant neoplasm of lower-inner quadrant of left breast in female, estrogen receptor positive (HCC) 11/05/2023   Non-pitting edema 09/24/2023   Generalized rash 07/04/2023   Morbid obesity (HCC) 05/06/2023   Prediabetes 10/04/2022   Anxiety and depression 09/04/2022   Asthma 09/04/2022   Essential hypertension 09/04/2022   Hyperlipidemia 09/04/2022   Hx of migraines 09/04/2022   Abdominal cramping 09/04/2022   History of breast cancer 10/06/2021   Hot flashes 10/06/2021   Abdominal bloating 10/05/2020   Encounter for screening fecal occult blood testing 10/05/2020   Encounter for gynecological examination with Papanicolaou smear of cervix 10/05/2020   S/P abdominal supracervical subtotal hysterectomy 10/05/2020  Encounter for screening colonoscopy 08/21/2014   Anxiety state 12/13/2009   CHEST PAIN 12/13/2009   Breast cancer (HCC) 2009    PCP:    REFERRING PROVIDER: Dr. Debby Shipper   REFERRING DIAG: Left breast cancer   THERAPY DIAG:  Malignant neoplasm of lower-inner quadrant of left breast in female, estrogen receptor positive (HCC)  Abnormal posture  History of ductal carcinoma in situ (DCIS) of left breast  Stiffness of right shoulder, not elsewhere classified  Stiffness of left shoulder, not elsewhere classified  Localized edema  Rationale for Evaluation and Treatment:  Rehabilitation  ONSET DATE: 10/15/2023   SUBJECTIVE:                                                                                                                                                                                           SUBJECTIVE STATEMENT: The right wound is closing in nicely, the left is still big and I have to pack a lot. I see MD again next Friday. I didn't get to watch the video. We have had some family disrution this week. My birthday is Sunday and I am speaking at the church this weekend.  PERTINENT  HISTORY:  Patient was diagnosed on 10/15/2023 with left grade 2 invasive ductal carcinoma breast cancer. It measures 1.3 cm and is located in the lower inner quadrant. It is ER/PR positive and HER2 negative with a Ki67 of 5%. She has a history of left breast DCIS in 2009 and was treated with a lumpectomy and radiation. She is s/p Bilateral Mastectomies with left SLNB and 0+/3 LN's on 11/29/2023. She has had some difficulties with wound healing and was packing one wound each side.  PATIENT GOALS:  Reassess how my recovery is going related to arm function, pain, and swelling.  PAIN:  Are you having pain? Yes: NPRS scale: 0/10 Pain location: left arm Pain description: slow permeating, achiness Aggravating factors: inactivity Relieving factors: tylenol ,movement  PRECAUTIONS: Recent Surgery, left UE Lymphedema risk,   RED FLAGS: None   ACTIVITY LEVEL / LEISURE: resumed laundry, fixing food for her 20 yr old dad,loading and unloading dishwasher.   OBJECTIVE:   PATIENT SURVEYS:  QUICK DASH: 18%  OBSERVATIONS: 2.4 cm wound left chest area currently packed (see photo in media), right chest multiple scabs and smaller healing wound lightly packed and covered with abd pad.  POSTURE:  Forward head and rounded shoulders posture   LYMPHEDEMA ASSESSMENT:   UPPER EXTREMITY AROM/PROM:   A/PROM RIGHT   eval   RIGHT 12/31/2023  Shoulder extension 43 50  Shoulder  flexion 155 103, tight  Shoulder abduction 157 105 tight  Shoulder internal rotation 77 70  Shoulder external rotation 84 105                          (Blank rows = not tested)   A/PROM LEFT   eval LEFT 12/31/2023  Shoulder extension 46 50  Shoulder flexion 147 132  Shoulder abduction 161 145  Shoulder internal rotation 75 75  Shoulder external rotation 90 105                          (Blank rows = not tested)   CERVICAL AROM: All within normal limits   UPPER EXTREMITY STRENGTH: WNL   LYMPHEDEMA ASSESSMENTS (in cm):    LANDMARK RIGHT   eval RIGHT 12/31/2023  10 cm proximal to olecranon process 38 37  Olecranon process 28.5 28.2  10 cm proximal to ulnar styloid process 24.5 23.3  Just proximal to ulnar styloid process 15.1 15.4  Across hand at thumb web space 17.6 19.5  At base of 2nd digit 6.2 6.3  (Blank rows = not tested)   LANDMARK LEFT   eval LEFT 12/31/2023  10 cm proximal to olecranon process 38.4 38  Olecranon process 28 28.1  10 cm proximal to ulnar styloid process 22.2 22.4  Just proximal to ulnar styloid process 15.4 15.4  Across hand at thumb web space 19.4 19.7  At base of 2nd digit 6.4 6.4  (Blank rows = not tested)     Surgery type/Date: 11/29/2023 Bilateral Mastectomy, 09/25/2007 Left Lumpectomy for DCIs Number of lymph nodes removed: 0+/3 Current/past treatment (chemo, radiation, hormone therapy): Had prior radiation with lumpectomy in 2009, Took Tamoxifen 5 yrs after lumpectomy. Will start Letrozole Other symptoms:  Heaviness/tightness Yes, chest, arm Pain Yes Pitting edema No Infections on antibiotics due to fever 1 day Decreased scar mobility Yes Stemmer sign No    TREATMENT TODAY  01/04/2024 Checked wound area; no significant changes since last visit STM with cocoa butter to bilateral UT, cervicals, bilateral pectorals avoiding any areas close to healing wounds Supine wand flex and scaption x 5 ea, checked wound areas;no change with  ROM Stargazer x 5 ea PROM bilateral shoulders flexion, scaption, abd, IR and ER Updated HEP with wand exs for flex and scaption   PATIENT EDUCATION:  Education details: SOZO screens, need to hold scar massage until healed, have brother check wound area while exercising to be sure no opening of wound area during exs.(none noted today), ABC video watch and we will discuss Person educated: Patient Education method: Explanation, Demonstration, and Handouts Education comprehension: verbalized understanding and returned demonstration  HOME EXERCISE PROGRAM: Reviewed previously given post op HEP. Performed shoulder flexion, and stargazer x 5 in supine while observing wound area, scapular retraction, standing wall slides.   ASSESSMENT:  CLINICAL IMPRESSION:  Pt did well with wand exercises and relaxed well for PROM. The STM felt very good, and ROM is improving. No wound opening with ROM.  EVAL Pt is s/p Bilateral Mastectomies with left SLNB on 11/29/2023. She had a prior left lumpectomy for DCIS in 2009. She presents with limitations in shoulder ROM, Right greater than left.  She has poor wound healing and has a 2.5 cm area on the left chest that she is packing, and a smaller, more superficial wound on the right chest area, with multiple scabs noted full length.  See Photos in Media.Wound area was observed and there was no opening of wound  area noted with Pt performing AAROM for flexion and stargazer. Pt will benefit from skilled therapeutic intervention to improve on the following deficits: Decreased knowledge of precautions, impaired UE functional use, pain, decreased ROM, and postural dysfunction.   PT treatment/interventions: ADL/Self care home management, [phone]- PT Re-evaluation, 97110-Therapeutic exercises, 97530- Therapeutic activity, [id number]- Neuromuscular re-education, 97535- Self Care, 02859- Manual therapy, [id number]- Orthotic Initial, and [id number]- Orthotic/Prosthetic subsequent   GOALS: Goals  reviewed with patient? Yes  GOALS MET AT EVAL:  GOALS Name Target Date Goal status  1 Pt will be able to verbalize understanding of pertinent lymphedema risk reduction practices relevant to her dx specifically related to skin care.  Baseline:  No knowledge Eval Achieved at eval  2 Pt will be able to return demo and/or verbalize understanding of the post op HEP related to regaining shoulder ROM. Baseline:  No knowledge Eval Achieved at eval  3 Pt will be able to verbalize understanding of the importance of viewing the post op After Breast CA Class video for further lymphedema risk reduction education and therapeutic exercise.  Baseline:  No knowledge Eval Achieved at eval   LONG TERM GOALS:  (STG=LTG)  GOALS Name Target Date  Goal status  1 Pt will demonstrate she has regained full shoulder ROM and function post operatively compared to baselines.  Baseline: 02/04/2024 INITIAL  2 Pt will watch ABC video and will have all questions answered 02/04/2024 INITIAL  3 Pt will be independent with HEP to improve ROM and gentle strength of bilateral UE's 02/04/2024 INITIAL     PLAN:  PT FREQUENCY/DURATION: 2x/week x 5 weeks  PLAN FOR NEXT SESSION: recheck wound area and watch for opening with ROM activities, PROM, STM prn, MLD prn   [facility]  [address], [address]  [place] [address]  [phone]  After Breast Cancer Class Video It is recommended you view the ABC class video to be educated on lymphedema risk reduction. This video lasts for about 30 minutes. It can be viewed on our website here: [email or web address]  Scar massage You can begin gentle scar massage to you incision sites. Gently place one hand on the incision and move the skin (without sliding on the skin) in various directions. Do this for a few minutes and then you can gently massage either coconut oil or vitamin E cream into the  scars.  Compression garment You should continue wearing your compression bra until you feel like you no longer have swelling.  Home exercise Program Continue doing the exercises you were given until you feel like you can do them without feeling any tightness at the end.   Walking Program Studies show that 30 minutes of walking per day (fast enough to elevate your heart rate) can significantly reduce the risk of a cancer recurrence. If you can't walk due to other medical reasons, we encourage you to find another activity you could do (like a stationary bike or water  exercise).  Posture After breast cancer surgery, people frequently sit with rounded shoulders posture because it puts their incisions on slack and feels better. If you sit like this and scar tissue forms in that position, you can become very tight and have pain sitting or standing with good posture. Try to be aware of your posture and sit and stand up tall to heal properly.  Follow up PT: It is recommended you return every 3 months for the first 3 years following surgery to be assessed on the SOZO machine for an  L-Dex score. This helps prevent clinically significant lymphedema in 95% of patients. These follow up screens are 10 minute appointments that you are not billed for.  Grayce JINNY Sheldon, PT 01/04/2024, 8:53 AM

## 2024-01-04 NOTE — Patient Instructions (Signed)
 SHOULDER: Flexion - Supine (Cane)        Cancer Rehab [phone]    Hold cane in both hands. Raise arms up overhead. Do not allow back to arch. Hold _5__ seconds. Do __5__ times; __2__ times a day. Hands shoulder width apart Hands slightly wider than shoulder width  Shoulder Blade Stretch    Clasp fingers behind head with elbows touching in front of face. Pull elbows back while pressing shoulder blades together. Relax and hold as tolerated, can place pillow under elbow here for comfort as needed and to allow for prolonged stretch.  Repeat __5__ times. Do __1-2__ sessions per day.    Copyright  VHI. All rights reserved.

## 2024-01-04 NOTE — Patient Instructions (Addendum)
 I appreciate the opportunity to provide care to you today!   Labs: please stop by the lab today to get your blood drawn (CBC, BMP)  Wound Care Instructions: -Clean the wound gently using sterile saline solution. -Pack the wound with a plain packing strip using a sterile cotton-tipped applicator (Q-tip). -Cover the wound with a sterile gauze pad and wrap with additional sterile gauze for protection. -Apply a brown Telfa pad (non-stick dressing) over the gauze. -Put on a compression wrap or compression bra (as applicable) to provide gentle support. -Place maxi pads over the wound area overnight to help absorb any excess drainage. -Keep the dressing clean and dry, and change daily or sooner if it becomes saturated  Please Monitor For: -Increased redness, swelling, or warmth around the wound -Drainage that becomes yellow, green, or foul-smelling -Bleeding that soaks through the dressing -Fever, chills, or body aches -Increased pain at or around the wound site -Changes in skin color (darkening or black areas) near the wound edges -Any signs that the dressing is too tight or cutting off circulation (numbness, tingling, or coolness of the skin)  Please follow up if your symptoms worsen or fail to improve.   Referrals today-  Wound care   Please continue to a heart-healthy diet and increase your physical activities. Try to exercise for at least five days a week.    It was a pleasure to see you and I look forward to continuing to work together on your health and well-being. Please do not hesitate to call the office if you need care or have questions about your care.  In case of emergency, please visit the Emergency Department for urgent care, or contact our clinic at [phone] to schedule an appointment. We're here to help you!   Have a wonderful day and week. With Gratitude, Meade JENEANE Gerlach MSN, FNP-BC, PMHNP-BC

## 2024-01-05 LAB — CBC WITH DIFFERENTIAL/PLATELET
Basophils Absolute: 0.1 x10E3/uL (ref 0.0–0.2)
Basos: 1 %
EOS (ABSOLUTE): 0.5 x10E3/uL — ABNORMAL HIGH (ref 0.0–0.4)
Eos: 8 %
Hematocrit: 42.2 % (ref 34.0–46.6)
Hemoglobin: 13.7 g/dL (ref 11.1–15.9)
Immature Grans (Abs): 0 x10E3/uL (ref 0.0–0.1)
Immature Granulocytes: 0 %
Lymphocytes Absolute: 2.5 x10E3/uL (ref 0.7–3.1)
Lymphs: 35 %
MCH: 28.5 pg (ref 26.6–33.0)
MCHC: 32.5 g/dL (ref 31.5–35.7)
MCV: 88 fL (ref 79–97)
Monocytes Absolute: 0.8 x10E3/uL (ref 0.1–0.9)
Monocytes: 12 %
Neutrophils Absolute: 3.2 x10E3/uL (ref 1.4–7.0)
Neutrophils: 44 %
Platelets: 345 x10E3/uL (ref 150–450)
RBC: 4.81 x10E6/uL (ref 3.77–5.28)
RDW: 13.4 % (ref 11.7–15.4)
WBC: 7.1 x10E3/uL (ref 3.4–10.8)

## 2024-01-05 LAB — BMP8+EGFR
BUN/Creatinine Ratio: 21 (ref 12–28)
BUN: 18 mg/dL (ref 8–27)
CO2: 23 mmol/L (ref 20–29)
Calcium: 9.7 mg/dL (ref 8.7–10.3)
Chloride: 99 mmol/L (ref 96–106)
Creatinine, Ser: 0.86 mg/dL (ref 0.57–1.00)
Glucose: 87 mg/dL (ref 70–99)
Potassium: 4.1 mmol/L (ref 3.5–5.2)
Sodium: 142 mmol/L (ref 134–144)
eGFR: 77 mL/min/1.73 (ref >59)

## 2024-01-06 DIAGNOSIS — Z9013 Acquired absence of bilateral breasts and nipples: Secondary | ICD-10-CM | POA: Insufficient documentation

## 2024-01-06 NOTE — Assessment & Plan Note (Addendum)
 Labs and imaging studies reviewed Discussed Wound Care: Clean with saline solution, pack with plain sterile packing strip using sterile cotton-tipped applicator, apply sterile gauze and brown Telfa, then place a compression bra with maxi pads over the wound sites overnight for drainage. Encouraged to continue follow up with PT and oncology as scheduled

## 2024-01-08 ENCOUNTER — Telehealth: Payer: Self-pay

## 2024-01-08 NOTE — Telephone Encounter (Unsigned)
 Copied from CRM #[id number]. Topic: Clinical - Prescription Issue >> Jan 08, 2024  3:07 PM Lonell PEDLAR wrote: Reason for CRM: Patient called requesting an update on PA for Zepbound , please c/b [phone]

## 2024-01-09 ENCOUNTER — Ambulatory Visit: Payer: Self-pay

## 2024-01-09 ENCOUNTER — Other Ambulatory Visit: Payer: Self-pay

## 2024-01-09 ENCOUNTER — Telehealth: Payer: Self-pay

## 2024-01-09 DIAGNOSIS — J309 Allergic rhinitis, unspecified: Secondary | ICD-10-CM

## 2024-01-09 NOTE — Telephone Encounter (Signed)
 Patient calling to check on status of prior authorization. Advised of Laura's message. Patient verbalized understanding.

## 2024-01-09 NOTE — Telephone Encounter (Signed)
 PA request has been Received. New Encounter has been or will be created for follow up. For additional info see Pharmacy telephone encounter from 01/01/2024.  Patient must have a diagnosis of moderate to severe OSA confirmed by a sleep study and a BMI of 40 of above. She does not meet the new coverage criteria effective 12/12/2023.

## 2024-01-09 NOTE — Telephone Encounter (Signed)
 Copied from CRM #[id number]. Topic: Clinical - Medication Question >> Dec 31, 2023  1:50 PM [name] wrote: Reason for CRM: Patient called in stating she is currently taking Zepbound  12.5 mg. She believes it is time to increase her dosage and is requesting that her provider send the next higher dosage to her pharmacy. Patient reported that [facility], her preferred pharmacy, advised her to call in to request a refill. However, since she is requesting an increased dosage, she would like the higher dosage prescription to be sent instead. Patient also requested confirmation through MyChart once the updated prescription has been sent.   [facility] [address] [place] - [address] [place] [address] Phone: [phone] Fax: [phone]

## 2024-01-09 NOTE — Telephone Encounter (Signed)
 Please inform patient that as of October 1st her insurance will no longer cover Zepbound .  In order for them to cover it, she has to have the following:    Patient must have a diagnosis of moderate to severe OSA confirmed by a sleep study and a BMI of 40 of above. She does not meet the new coverage criteria effective 12/12/2023.

## 2024-01-09 NOTE — Telephone Encounter (Signed)
 noted

## 2024-01-09 NOTE — Telephone Encounter (Signed)
 Patient reached out to [facility] about the referral, however was informed they are waiting for an okay from [facility]. Patient is wanting an appointment for wound care as soon as possible.    Requesting call back: [phone]

## 2024-01-09 NOTE — Telephone Encounter (Signed)
 Copied from CRM [id number]. Topic: Referral - Status >> Jan 09, 2024  9:50 AM [name] wrote: Reason for CRM: Pt is calling to check the status of a referral for her mastectomy incision. Referral is in a pending status. Please all pt with an update

## 2024-01-09 NOTE — Telephone Encounter (Signed)
 Higher dose was sent in last week for Zepbound  15 mg.

## 2024-01-10 ENCOUNTER — Other Ambulatory Visit: Payer: Self-pay

## 2024-01-10 ENCOUNTER — Telehealth: Payer: Self-pay

## 2024-01-10 NOTE — Telephone Encounter (Signed)
-----   Message from [place] sent at 12/24/2023 11:39 AM EDT ----- Leita  Can we fax the bone density order to [facility]?  Thanks,

## 2024-01-10 NOTE — Telephone Encounter (Signed)
 Order and demo/insurance faxed to [facility] for DEXA. Confirmation received.

## 2024-01-10 NOTE — Telephone Encounter (Signed)
 Referral has been placed for wound care, but this status still says  pending, so I do not know what sort of approval they need from us  since there is a referral in place

## 2024-01-11 ENCOUNTER — Ambulatory Visit

## 2024-01-11 DIAGNOSIS — M25611 Stiffness of right shoulder, not elsewhere classified: Secondary | ICD-10-CM

## 2024-01-11 DIAGNOSIS — R600 Localized edema: Secondary | ICD-10-CM

## 2024-01-11 DIAGNOSIS — Z170 Estrogen receptor positive status [ER+]: Secondary | ICD-10-CM

## 2024-01-11 DIAGNOSIS — C50312 Malignant neoplasm of lower-inner quadrant of left female breast: Secondary | ICD-10-CM | POA: Diagnosis not present

## 2024-01-11 DIAGNOSIS — Z86000 Personal history of in-situ neoplasm of breast: Secondary | ICD-10-CM

## 2024-01-11 DIAGNOSIS — R293 Abnormal posture: Secondary | ICD-10-CM

## 2024-01-11 DIAGNOSIS — M25612 Stiffness of left shoulder, not elsewhere classified: Secondary | ICD-10-CM

## 2024-01-11 NOTE — Therapy (Signed)
 OUTPATIENT PHYSICAL THERAPY BREAST CANCER TREATMENT   Patient Name: [name] MRN: [id number] DOB:[birth year], 63 y.o., female Today's Date: 01/11/2024  END OF SESSION:  PT End of Session - 01/11/24 0803     Visit Number 4    Number of Visits 12    Date for Recertification  02/04/24    Authorization Type Medicaid    Authorization Time Period 10/20-12/19/2025    Authorization - Visit Number 3    Authorization - Number of Visits 12    PT Start Time 0801    PT Stop Time 0858    PT Time Calculation (min) 57 min    Activity Tolerance Patient tolerated treatment well    Behavior During Therapy [facility] for tasks assessed/performed          Past Medical History:  Diagnosis Date   Anxiety    Asthma    Asthma due to seasonal allergies    Breast cancer (HCC) 2009   Tamoxifen, left lumpectomy   Cough    Cramps, muscle, general    Depression    Diarrhea    Diverticulosis    Eczema    Hypertension    Palpitation    Personal history of radiation therapy    Pre-diabetes    Visual disturbance    Wheezing    Past Surgical History:  Procedure Laterality Date   ABDOMINAL HYSTERECTOMY  09/10/2005   still have ovaries   BIOPSY N/A 09/08/2014   Procedure: BIOPSY random colon;  Surgeon: Margo LITTIE Haddock, MD;  Location: [facility];  Service: Endoscopy;  Laterality: N/A;   BREAST BIOPSY Right 10/2020   Breast tissue with fibrosis.  No atypia or malignancy.   BREAST BIOPSY Left 10/26/2023   US  LT BREAST BX W LOC DEV 1ST LESION IMG BX SPEC US  GUIDE 10/26/2023 [facility] MAMMOGRAPHY   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   COLONOSCOPY WITH PROPOFOL  N/A 09/08/2014   Procedure: COLONOSCOPY WITH PROPOFOL ; in cecum at 0904; withdrawal time 16 minutes;  Surgeon: Margo LITTIE Haddock, MD;  Location: [facility];  Service: Endoscopy;  Laterality: N/A;   MASTECTOMY W/ SENTINEL NODE BIOPSY Left 11/29/2023   Procedure:  MASTECTOMY WITH SENTINEL LYMPH NODE BIOPSY;  Surgeon: Vanderbilt Ned, MD;  Location: [facility];  Service: General;  Laterality: Left;  GEN w/PEC BLOCK LEFT SIMPLE MASTECTOMY LEFT SENTINEL LYMPH NODE MAPPING   TOTAL MASTECTOMY Right 11/29/2023   Procedure: MASTECTOMY, SIMPLE;  Surgeon: Vanderbilt Ned, MD;  Location: [facility];  Service: General;  Laterality: Right;  RIGHT RISK REDUCING MASTECTOMY   Patient Active Problem List   Diagnosis Date Noted   S/P mastectomy, bilateral 01/06/2024   Well woman exam with routine gynecological exam 01/03/2024   Genetic testing 11/19/2023   Malignant neoplasm of lower-inner quadrant of left breast in female, estrogen receptor positive (HCC) 11/05/2023   Non-pitting edema 09/24/2023   Generalized rash 07/04/2023   Morbid obesity (HCC) 05/06/2023   Prediabetes 10/04/2022   Anxiety and depression 09/04/2022   Asthma 09/04/2022   Essential hypertension 09/04/2022   Hyperlipidemia 09/04/2022   Hx of migraines 09/04/2022   Abdominal cramping 09/04/2022   History of breast cancer 10/06/2021   Hot flashes 10/06/2021   Abdominal bloating 10/05/2020   Encounter for screening fecal occult blood testing 10/05/2020   Encounter for gynecological examination with Papanicolaou smear of cervix 10/05/2020   S/P abdominal supracervical subtotal  hysterectomy 10/05/2020   Encounter for screening colonoscopy 08/21/2014   Anxiety state 12/13/2009   CHEST PAIN 12/13/2009   Breast cancer (HCC) 2009    PCP:    REFERRING PROVIDER: Dr. Debby Shipper   REFERRING DIAG: Left breast cancer   THERAPY DIAG:  Malignant neoplasm of lower-inner quadrant of left breast in female, estrogen receptor positive (HCC)  Abnormal posture  History of ductal carcinoma in situ (DCIS) of left breast  Stiffness of right shoulder, not elsewhere classified  Stiffness of left shoulder, not elsewhere classified  Localized edema  Rationale for  Evaluation and Treatment: Rehabilitation  ONSET DATE: 10/15/2023   SUBJECTIVE:                                                                                                                                                                                           SUBJECTIVE STATEMENT: I felt really good after last visit and the exercises are going well. The Rt wound is closing really well, it looks like it's filled in. The Lt one I do think looks like it's trying to close. I'm supposed to be getting into the wound center but I haven't heard from them yet. I'm seeing Dr. Shipper later this morning so hopefully they can tell me how the wound is looking and get me going with the wound center because my brother is helping me right now and that's just not ideal.   PERTINENT  HISTORY:  Patient was diagnosed on 10/15/2023 with left grade 2 invasive ductal carcinoma breast cancer. It measures 1.3 cm and is located in the lower inner quadrant. It is ER/PR positive and HER2 negative with a Ki67 of 5%. She has a history of left breast DCIS in 2009 and was treated with a lumpectomy and radiation. She is s/p Bilateral Mastectomies with left SLNB and 0+/3 LN's on 11/29/2023. She has had some difficulties with wound healing and was packing one wound each side.  PATIENT GOALS:  Reassess how my recovery is going related to arm function, pain, and swelling.  PAIN:  Are you having pain? No, feeling good as far as pain  PRECAUTIONS: Recent Surgery, left UE Lymphedema risk,   RED FLAGS: None   ACTIVITY LEVEL / LEISURE: resumed laundry, fixing food for her 26 yr old dad,loading and unloading dishwasher.   OBJECTIVE:   PATIENT SURVEYS:  QUICK DASH: 18%  OBSERVATIONS: 2.4 cm wound left chest area currently packed (see photo in media), right chest multiple scabs and smaller healing wound lightly packed and covered with abd pad.  POSTURE:  Forward head and rounded shoulders posture   LYMPHEDEMA ASSESSMENT:  UPPER EXTREMITY AROM/PROM:   A/PROM RIGHT   eval   RIGHT 12/31/2023  Shoulder extension 43 50  Shoulder flexion 155 103, tight  Shoulder abduction 157 105 tight  Shoulder internal rotation 77 70  Shoulder external rotation 84 105                          (Blank rows = not tested)   A/PROM LEFT   eval LEFT 12/31/2023  Shoulder extension 46 50  Shoulder flexion 147 132  Shoulder abduction 161 145  Shoulder internal rotation 75 75  Shoulder external rotation 90 105                          (Blank rows = not tested)   CERVICAL AROM: All within normal limits   UPPER EXTREMITY STRENGTH: WNL   LYMPHEDEMA ASSESSMENTS (in cm):    LANDMARK RIGHT   eval RIGHT 12/31/2023  10 cm proximal to olecranon process 38 37  Olecranon process 28.5 28.2  10 cm proximal to ulnar styloid process 24.5 23.3  Just proximal to ulnar styloid process 15.1 15.4  Across hand at thumb web space 17.6 19.5  At base of 2nd digit 6.2 6.3  (Blank rows = not tested)   LANDMARK LEFT   eval LEFT 12/31/2023  10 cm proximal to olecranon process 38.4 38  Olecranon process 28 28.1  10 cm proximal to ulnar styloid process 22.2 22.4  Just proximal to ulnar styloid process 15.4 15.4  Across hand at thumb web space 19.4 19.7  At base of 2nd digit 6.4 6.4  (Blank rows = not tested)     Surgery type/Date: 11/29/2023 Bilateral Mastectomy, 09/25/2007 Left Lumpectomy for DCIs Number of lymph nodes removed: 0+/3 Current/past treatment (chemo, radiation, hormone therapy): Had prior radiation with lumpectomy in 2009, Took Tamoxifen 5 yrs after lumpectomy. Will start Letrozole Other symptoms:  Heaviness/tightness Yes, chest, arm Pain Yes Pitting edema No Infections on antibiotics due to fever 1 day Decreased scar mobility Yes Stemmer sign No    TREATMENT TODAY  01/11/24: Therapeutic Exercise Pulleys into flexion and abduction x 3 mins each with demo and then VC's throughout to relax UE and decrease  scapular compensations Roll large green ball on mat table into flex x 10, then abd x 5 each cautioning pt to be mindful not to feel pulling in Lt wound. Manual Therapy P/ROM to bil shoulders into flex, abd and D2 with scapular depression by therapist throughout STM to bil pectoralis tendons during P/ROM  01/04/2024 Checked wound area; no significant changes since last visit STM with cocoa butter to bilateral UT, cervicals, bilateral pectorals avoiding any areas close to healing wounds Supine wand flex and scaption x 5 ea, checked wound areas;no change with ROM Stargazer x 5 ea PROM bilateral shoulders flexion, scaption, abd, IR and ER Updated HEP with wand exs for flex and scaption   PATIENT EDUCATION:  Education details: SOZO screens, need to hold scar massage until healed, have brother check wound area while exercising to be sure no opening of wound area during exs.(none noted today), ABC video watch and we will discuss Person educated: Patient Education method: Explanation, Demonstration, and Handouts Education comprehension: verbalized understanding and returned demonstration  HOME EXERCISE PROGRAM: Reviewed previously given post op HEP. Performed shoulder flexion, and stargazer x 5 in supine while observing wound area, scapular retraction, standing wall slides.   ASSESSMENT:  CLINICAL IMPRESSION: Progressed  pt to include AA/ROM stretches today. She reported feeling very good stretches with these. Then continued with manual therapy working to decrease bil upper quadrant tightness. She has an appt with Dr. Lorence office this morning to reassess wound healing.   EVAL Pt is s/p Bilateral Mastectomies with left SLNB on 11/29/2023. She had a prior left lumpectomy for DCIS in 2009. She presents with limitations in shoulder ROM, Right greater than left.  She has poor wound healing and has a 2.5 cm area on the left chest that she is packing, and a smaller, more superficial wound on the  right chest area, with multiple scabs noted full length.  See Photos in Media.Wound area was observed and there was no opening of wound area noted with Pt performing AAROM for flexion and stargazer. Pt will benefit from skilled therapeutic intervention to improve on the following deficits: Decreased knowledge of precautions, impaired UE functional use, pain, decreased ROM, and postural dysfunction.   PT treatment/interventions: ADL/Self care home management, [phone]- PT Re-evaluation, 97110-Therapeutic exercises, 97530- Therapeutic activity, [id number]- Neuromuscular re-education, 97535- Self Care, [id number]- Manual therapy, [id number]- Orthotic Initial, and [id number]- Orthotic/Prosthetic subsequent   GOALS: Goals reviewed with patient? Yes  GOALS MET AT EVAL:  GOALS Name Target Date Goal status  1 Pt will be able to verbalize understanding of pertinent lymphedema risk reduction practices relevant to her dx specifically related to skin care.  Baseline:  No knowledge Eval Achieved at eval  2 Pt will be able to return demo and/or verbalize understanding of the post op HEP related to regaining shoulder ROM. Baseline:  No knowledge Eval Achieved at eval  3 Pt will be able to verbalize understanding of the importance of viewing the post op After Breast CA Class video for further lymphedema risk reduction education and therapeutic exercise.  Baseline:  No knowledge Eval Achieved at eval   LONG TERM GOALS:  (STG=LTG)  GOALS Name Target Date  Goal status  1 Pt will demonstrate she has regained full shoulder ROM and function post operatively compared to baselines.  Baseline: 02/04/2024 INITIAL  2 Pt will watch ABC video and will have all questions answered 02/04/2024 INITIAL  3 Pt will be independent with HEP to improve ROM and gentle strength of bilateral UE's 02/04/2024 INITIAL     PLAN:  PT FREQUENCY/DURATION: 2x/week x 5 weeks  PLAN FOR NEXT SESSION: How was appt with Dr. Vanderbilt? Cont to assess Lt>Rt wound  area with stretches and watch for opening with ROM activities, PROM, STM prn, MLD prn   [facility]  [address]  [place] [address]  [phone]    Aden Berwyn Caldron, PTA 01/11/2024, 9:17 AM

## 2024-01-11 NOTE — Telephone Encounter (Signed)
 Pt advised seeing her Surgeon today

## 2024-01-14 ENCOUNTER — Ambulatory Visit: Payer: Self-pay | Admitting: Family Medicine

## 2024-01-14 ENCOUNTER — Ambulatory Visit

## 2024-01-20 ENCOUNTER — Encounter: Payer: Self-pay | Admitting: Family Medicine

## 2024-01-21 ENCOUNTER — Other Ambulatory Visit: Payer: Self-pay | Admitting: Family Medicine

## 2024-01-25 ENCOUNTER — Other Ambulatory Visit: Payer: Self-pay

## 2024-01-25 ENCOUNTER — Ambulatory Visit: Attending: Surgery

## 2024-01-25 ENCOUNTER — Ambulatory Visit: Admitting: Nurse Practitioner

## 2024-01-25 DIAGNOSIS — Z86000 Personal history of in-situ neoplasm of breast: Secondary | ICD-10-CM | POA: Diagnosis present

## 2024-01-25 DIAGNOSIS — R600 Localized edema: Secondary | ICD-10-CM | POA: Insufficient documentation

## 2024-01-25 DIAGNOSIS — R293 Abnormal posture: Secondary | ICD-10-CM | POA: Insufficient documentation

## 2024-01-25 DIAGNOSIS — C50312 Malignant neoplasm of lower-inner quadrant of left female breast: Secondary | ICD-10-CM | POA: Insufficient documentation

## 2024-01-25 DIAGNOSIS — Z170 Estrogen receptor positive status [ER+]: Secondary | ICD-10-CM | POA: Diagnosis present

## 2024-01-25 DIAGNOSIS — M25611 Stiffness of right shoulder, not elsewhere classified: Secondary | ICD-10-CM | POA: Diagnosis present

## 2024-01-25 DIAGNOSIS — M25612 Stiffness of left shoulder, not elsewhere classified: Secondary | ICD-10-CM | POA: Insufficient documentation

## 2024-01-25 MED ORDER — PHENTERMINE HCL 37.5 MG PO CAPS: 37.5000 mg | ORAL_CAPSULE | ORAL | 2 refills | Status: AC

## 2024-01-25 NOTE — Therapy (Signed)
 OUTPATIENT PHYSICAL THERAPY BREAST CANCER TREATMENT   Patient Name: [name] MRN: [id number] DOB:[birth year], 63 y.o., female Today's Date: 01/25/2024  END OF SESSION:  PT End of Session - 01/25/24 0906     Visit Number 5    Number of Visits 12    Date for Recertification  02/04/24    Authorization Type Medicaid    Authorization Time Period 10/20-12/19/2025    Authorization - Visit Number 4    Authorization - Number of Visits 12    PT Start Time 0902    PT Stop Time 1002    PT Time Calculation (min) 60 min    Activity Tolerance Patient tolerated treatment well    Behavior During Therapy [facility] for tasks assessed/performed          Past Medical History:  Diagnosis Date   Anxiety    Asthma    Asthma due to seasonal allergies    Breast cancer (HCC) 2009   Tamoxifen, left lumpectomy   Cough    Cramps, muscle, general    Depression    Diarrhea    Diverticulosis    Eczema    Hypertension    Palpitation    Personal history of radiation therapy    Pre-diabetes    Visual disturbance    Wheezing    Past Surgical History:  Procedure Laterality Date   ABDOMINAL HYSTERECTOMY  09/10/2005   still have ovaries   BIOPSY N/A 09/08/2014   Procedure: BIOPSY random colon;  Surgeon: Margo LITTIE Haddock, MD;  Location: [facility];  Service: Endoscopy;  Laterality: N/A;   BREAST BIOPSY Right 10/2020   Breast tissue with fibrosis.  No atypia or malignancy.   BREAST BIOPSY Left 10/26/2023   US  LT BREAST BX W LOC DEV 1ST LESION IMG BX SPEC US  GUIDE 10/26/2023 [facility] MAMMOGRAPHY   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   COLONOSCOPY WITH PROPOFOL  N/A 09/08/2014   Procedure: COLONOSCOPY WITH PROPOFOL ; in cecum at 0904; withdrawal time 16 minutes;  Surgeon: Margo LITTIE Haddock, MD;  Location: [facility];  Service: Endoscopy;  Laterality: N/A;   MASTECTOMY W/ SENTINEL NODE BIOPSY Left 11/29/2023   Procedure:  MASTECTOMY WITH SENTINEL LYMPH NODE BIOPSY;  Surgeon: Vanderbilt Ned, MD;  Location: [facility];  Service: General;  Laterality: Left;  GEN w/PEC BLOCK LEFT SIMPLE MASTECTOMY LEFT SENTINEL LYMPH NODE MAPPING   TOTAL MASTECTOMY Right 11/29/2023   Procedure: MASTECTOMY, SIMPLE;  Surgeon: Vanderbilt Ned, MD;  Location: [facility];  Service: General;  Laterality: Right;  RIGHT RISK REDUCING MASTECTOMY   Patient Active Problem List   Diagnosis Date Noted   S/P mastectomy, bilateral 01/06/2024   Well woman exam with routine gynecological exam 01/03/2024   Genetic testing 11/19/2023   Malignant neoplasm of lower-inner quadrant of left breast in female, estrogen receptor positive (HCC) 11/05/2023   Non-pitting edema 09/24/2023   Generalized rash 07/04/2023   Morbid obesity (HCC) 05/06/2023   Prediabetes 10/04/2022   Anxiety and depression 09/04/2022   Asthma 09/04/2022   Essential hypertension 09/04/2022   Hyperlipidemia 09/04/2022   Hx of migraines 09/04/2022   Abdominal cramping 09/04/2022   History of breast cancer 10/06/2021   Hot flashes 10/06/2021   Abdominal bloating 10/05/2020   Encounter for screening fecal occult blood testing 10/05/2020   Encounter for gynecological examination with Papanicolaou smear of cervix 10/05/2020   S/P abdominal supracervical subtotal  hysterectomy 10/05/2020   Encounter for screening colonoscopy 08/21/2014   Anxiety state 12/13/2009   CHEST PAIN 12/13/2009   Breast cancer (HCC) 2009    PCP:    REFERRING PROVIDER: Dr. Debby Shipper   REFERRING DIAG: Left breast cancer   THERAPY DIAG:  Malignant neoplasm of lower-inner quadrant of left breast in female, estrogen receptor positive (HCC)  Abnormal posture  History of ductal carcinoma in situ (DCIS) of left breast  Stiffness of right shoulder, not elsewhere classified  Stiffness of left shoulder, not elsewhere classified  Localized edema  Rationale for  Evaluation and Treatment: Rehabilitation  ONSET DATE: 10/15/2023   SUBJECTIVE:                                                                                                                                                                                           SUBJECTIVE STATEMENT: I saw Dr. Shipper after I was here last. He was really bothered that wound center hadn't reached out to me yet so he said just don't worry about them because my brother and I were doing ok. Now the Rt side has closed up and the Lt side is closing. I did have a blister pop up right under the Lt incision but within 2 days it opened and the fluid was clear. So I'm just treating it with the incision.    PERTINENT  HISTORY:  Patient was diagnosed on 10/15/2023 with left grade 2 invasive ductal carcinoma breast cancer. It measures 1.3 cm and is located in the lower inner quadrant. It is ER/PR positive and HER2 negative with a Ki67 of 5%. She has a history of left breast DCIS in 2009 and was treated with a lumpectomy and radiation. She is s/p Bilateral Mastectomies with left SLNB and 0+/3 LN's on 11/29/2023. She has had some difficulties with wound healing and was packing one wound each side.  PATIENT GOALS:  Reassess how my recovery is going related to arm function, pain, and swelling.  PAIN:  Are you having pain? No, feeling good as far as pain  PRECAUTIONS: Recent Surgery, left UE Lymphedema risk,   RED FLAGS: None   ACTIVITY LEVEL / LEISURE: resumed laundry, fixing food for her 44 yr old dad,loading and unloading dishwasher.   OBJECTIVE:   PATIENT SURVEYS:  QUICK DASH: 18%  OBSERVATIONS: 2.4 cm wound left chest area currently packed (see photo in media), right chest multiple scabs and smaller healing wound lightly packed and covered with abd pad.  POSTURE:  Forward head and rounded shoulders posture   LYMPHEDEMA ASSESSMENT:   UPPER EXTREMITY AROM/PROM:   A/PROM RIGHT   eval  RIGHT 12/31/2023   Shoulder extension 43 50  Shoulder flexion 155 103, tight  Shoulder abduction 157 105 tight  Shoulder internal rotation 77 70  Shoulder external rotation 84 105                          (Blank rows = not tested)   A/PROM LEFT   eval LEFT 12/31/2023  Shoulder extension 46 50  Shoulder flexion 147 132  Shoulder abduction 161 145  Shoulder internal rotation 75 75  Shoulder external rotation 90 105                          (Blank rows = not tested)   CERVICAL AROM: All within normal limits   UPPER EXTREMITY STRENGTH: WNL   LYMPHEDEMA ASSESSMENTS (in cm):    LANDMARK RIGHT   eval RIGHT 12/31/2023  10 cm proximal to olecranon process 38 37  Olecranon process 28.5 28.2  10 cm proximal to ulnar styloid process 24.5 23.3  Just proximal to ulnar styloid process 15.1 15.4  Across hand at thumb web space 17.6 19.5  At base of 2nd digit 6.2 6.3  (Blank rows = not tested)   LANDMARK LEFT   eval LEFT 12/31/2023  10 cm proximal to olecranon process 38.4 38  Olecranon process 28 28.1  10 cm proximal to ulnar styloid process 22.2 22.4  Just proximal to ulnar styloid process 15.4 15.4  Across hand at thumb web space 19.4 19.7  At base of 2nd digit 6.4 6.4  (Blank rows = not tested)     Surgery type/Date: 11/29/2023 Bilateral Mastectomy, 09/25/2007 Left Lumpectomy for DCIs Number of lymph nodes removed: 0+/3 Current/past treatment (chemo, radiation, hormone therapy): Had prior radiation with lumpectomy in 2009, Took Tamoxifen 5 yrs after lumpectomy. Will start Letrozole Other symptoms:  Heaviness/tightness Yes, chest, arm Pain Yes Pitting edema No Infections on antibiotics due to fever 1 day Decreased scar mobility Yes Stemmer sign No    TREATMENT TODAY  01/25/24: Therapeutic Exercise Pulleys into flexion and abduction x 3 mins each with demo and then VC's throughout to relax UE though pt did better with this today. Roll yellow ball up wall into flex x 10, and then bil  UE abd x 5 each returning therapist demo and VC's to decrease scap compensation Modified downward dog on wall x 5 reps, 5 sec holds returning therapist demo Manual Therapy P/ROM to bil shoulders into flex, abd and D2 with scapular depression by therapist throughout STM to bil pectoralis tendons during P/ROM, also to bil pect origins where pt reports feeling tightness MFR pressing down bil shoulders for chest stretch x 5 reps with 10 sec holds Assessed healing incisions: Rt mastectomy incision mostly closed and now just has appearance of a skin tear. Encouraged pt to stop putting bandaid on this to allow scar to form. Lt side still with small opening that she is packing. She is still using bandaids over area repotring that the paper tape seemed to irritate her skin. Today placed Tegaderm with nonadhesive gauze over Lt incision and new blister that is scabbed over and healing well inferior and lateral to incision. Issued second set of tegaderm and nonadjexive gauze for pt to use later.    01/11/24: Therapeutic Exercise Pulleys into flexion and abduction x 3 mins each with demo and then VC's throughout to relax UE and decrease scapular compensations Roll large green ball on mat  table into flex x 10, then abd x 5 each cautioning pt to be mindful not to feel pulling in Lt wound. Manual Therapy P/ROM to bil shoulders into flex, abd and D2 with scapular depression by therapist throughout STM to bil pectoralis tendons during P/ROM  01/04/2024 Checked wound area; no significant changes since last visit STM with cocoa butter to bilateral UT, cervicals, bilateral pectorals avoiding any areas close to healing wounds Supine wand flex and scaption x 5 ea, checked wound areas;no change with ROM Stargazer x 5 ea PROM bilateral shoulders flexion, scaption, abd, IR and ER Updated HEP with wand exs for flex and scaption   PATIENT EDUCATION:  Education details: SOZO screens, need to hold scar massage until  healed, have brother check wound area while exercising to be sure no opening of wound area during exs.(none noted today), ABC video watch and we will discuss Person educated: Patient Education method: Explanation, Demonstration, and Handouts Education comprehension: verbalized understanding and returned demonstration  HOME EXERCISE PROGRAM: Reviewed previously given post op HEP. Performed shoulder flexion, and stargazer x 5 in supine while observing wound area, scapular retraction, standing wall slides.   ASSESSMENT:  CLINICAL IMPRESSION: Continued with AA/ROM stretches. Pt reports not feeling a pull in Lt healing mastectomy incision, just axillary and shoulder tightness. Then continued with manual therapy working to decrease bil upper quadrant tightness. Assessed incision during manual therapy, see above.   EVAL Pt is s/p Bilateral Mastectomies with left SLNB on 11/29/2023. She had a prior left lumpectomy for DCIS in 2009. She presents with limitations in shoulder ROM, Right greater than left.  She has poor wound healing and has a 2.5 cm area on the left chest that she is packing, and a smaller, more superficial wound on the right chest area, with multiple scabs noted full length.  See Photos in Media.Wound area was observed and there was no opening of wound area noted with Pt performing AAROM for flexion and stargazer. Pt will benefit from skilled therapeutic intervention to improve on the following deficits: Decreased knowledge of precautions, impaired UE functional use, pain, decreased ROM, and postural dysfunction.   PT treatment/interventions: ADL/Self care home management, [phone]- PT Re-evaluation, 97110-Therapeutic exercises, 97530- Therapeutic activity, [id number]- Neuromuscular re-education, 97535- Self Care, [id number]- Manual therapy, [id number]- Orthotic Initial, and [id number]- Orthotic/Prosthetic subsequent   GOALS: Goals reviewed with patient? Yes  GOALS MET AT EVAL:  GOALS Name Target Date Goal  status  1 Pt will be able to verbalize understanding of pertinent lymphedema risk reduction practices relevant to her dx specifically related to skin care.  Baseline:  No knowledge Eval Achieved at eval  2 Pt will be able to return demo and/or verbalize understanding of the post op HEP related to regaining shoulder ROM. Baseline:  No knowledge Eval Achieved at eval  3 Pt will be able to verbalize understanding of the importance of viewing the post op After Breast CA Class video for further lymphedema risk reduction education and therapeutic exercise.  Baseline:  No knowledge Eval Achieved at eval   LONG TERM GOALS:  (STG=LTG)  GOALS Name Target Date  Goal status  1 Pt will demonstrate she has regained full shoulder ROM and function post operatively compared to baselines.  Baseline: 02/04/2024 INITIAL  2 Pt will watch ABC video and will have all questions answered 02/04/2024 INITIAL  3 Pt will be independent with HEP to improve ROM and gentle strength of bilateral UE's 02/04/2024 INITIAL     PLAN:  PT FREQUENCY/DURATION:  2x/week x 5 weeks  PLAN FOR NEXT SESSION: Add supine scapular series, Cont to assess Lt wound area with stretches and watch for opening with ROM activities, PROM, STM prn, MLD prn   [facility]  [address]  [place] [address]  [phone]    Aden Berwyn Caldron, PTA 01/25/2024, 10:42 AM

## 2024-01-28 ENCOUNTER — Other Ambulatory Visit: Payer: Self-pay

## 2024-01-28 DIAGNOSIS — F419 Anxiety disorder, unspecified: Secondary | ICD-10-CM

## 2024-02-01 ENCOUNTER — Ambulatory Visit

## 2024-02-01 DIAGNOSIS — M25612 Stiffness of left shoulder, not elsewhere classified: Secondary | ICD-10-CM

## 2024-02-01 DIAGNOSIS — C50312 Malignant neoplasm of lower-inner quadrant of left female breast: Secondary | ICD-10-CM | POA: Diagnosis not present

## 2024-02-01 DIAGNOSIS — Z86000 Personal history of in-situ neoplasm of breast: Secondary | ICD-10-CM

## 2024-02-01 DIAGNOSIS — R600 Localized edema: Secondary | ICD-10-CM

## 2024-02-01 DIAGNOSIS — M25611 Stiffness of right shoulder, not elsewhere classified: Secondary | ICD-10-CM

## 2024-02-01 DIAGNOSIS — R293 Abnormal posture: Secondary | ICD-10-CM

## 2024-02-01 NOTE — Therapy (Signed)
 OUTPATIENT PHYSICAL THERAPY BREAST CANCER TREATMENT   Patient Name: [name] MRN: [id number] DOB:[birth year], 63 y.o., female Today's Date: 02/01/2024  END OF SESSION:  PT End of Session - 02/01/24 0804     Visit Number 6    Number of Visits 12    Date for Recertification  02/04/24    Authorization Type Medicaid    Authorization Time Period 10/20-12/19/2025    Authorization - Visit Number 5    Authorization - Number of Visits 12    PT Start Time 0801    PT Stop Time 0901    PT Time Calculation (min) 60 min    Activity Tolerance Patient tolerated treatment well    Behavior During Therapy [facility] for tasks assessed/performed          Past Medical History:  Diagnosis Date   Anxiety    Asthma    Asthma due to seasonal allergies    Breast cancer (HCC) 2009   Tamoxifen, left lumpectomy   Cough    Cramps, muscle, general    Depression    Diarrhea    Diverticulosis    Eczema    Hypertension    Palpitation    Personal history of radiation therapy    Pre-diabetes    Visual disturbance    Wheezing    Past Surgical History:  Procedure Laterality Date   ABDOMINAL HYSTERECTOMY  09/10/2005   still have ovaries   BIOPSY N/A 09/08/2014   Procedure: BIOPSY random colon;  Surgeon: Margo LITTIE Haddock, MD;  Location: [facility];  Service: Endoscopy;  Laterality: N/A;   BREAST BIOPSY Right 10/2020   Breast tissue with fibrosis.  No atypia or malignancy.   BREAST BIOPSY Left 10/26/2023   US  LT BREAST BX W LOC DEV 1ST LESION IMG BX SPEC US  GUIDE 10/26/2023 [facility] MAMMOGRAPHY   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   COLONOSCOPY WITH PROPOFOL  N/A 09/08/2014   Procedure: COLONOSCOPY WITH PROPOFOL ; in cecum at 0904; withdrawal time 16 minutes;  Surgeon: Margo LITTIE Haddock, MD;  Location: [facility];  Service: Endoscopy;  Laterality: N/A;   MASTECTOMY W/ SENTINEL NODE BIOPSY Left 11/29/2023   Procedure:  MASTECTOMY WITH SENTINEL LYMPH NODE BIOPSY;  Surgeon: Vanderbilt Ned, MD;  Location: [facility];  Service: General;  Laterality: Left;  GEN w/PEC BLOCK LEFT SIMPLE MASTECTOMY LEFT SENTINEL LYMPH NODE MAPPING   TOTAL MASTECTOMY Right 11/29/2023   Procedure: MASTECTOMY, SIMPLE;  Surgeon: Vanderbilt Ned, MD;  Location: [facility];  Service: General;  Laterality: Right;  RIGHT RISK REDUCING MASTECTOMY   Patient Active Problem List   Diagnosis Date Noted   S/P mastectomy, bilateral 01/06/2024   Well woman exam with routine gynecological exam 01/03/2024   Genetic testing 11/19/2023   Malignant neoplasm of lower-inner quadrant of left breast in female, estrogen receptor positive (HCC) 11/05/2023   Non-pitting edema 09/24/2023   Generalized rash 07/04/2023   Morbid obesity (HCC) 05/06/2023   Prediabetes 10/04/2022   Anxiety and depression 09/04/2022   Asthma 09/04/2022   Essential hypertension 09/04/2022   Hyperlipidemia 09/04/2022   Hx of migraines 09/04/2022   Abdominal cramping 09/04/2022   History of breast cancer 10/06/2021   Hot flashes 10/06/2021   Abdominal bloating 10/05/2020   Encounter for screening fecal occult blood testing 10/05/2020   Encounter for gynecological examination with Papanicolaou smear of cervix 10/05/2020   S/P abdominal supracervical subtotal  hysterectomy 10/05/2020   Encounter for screening colonoscopy 08/21/2014   Anxiety state 12/13/2009   CHEST PAIN 12/13/2009   Breast cancer (HCC) 2009    PCP:    REFERRING PROVIDER: Dr. Debby Shipper   REFERRING DIAG: Left breast cancer   THERAPY DIAG:  Malignant neoplasm of lower-inner quadrant of left breast in female, estrogen receptor positive (HCC)  Abnormal posture  History of ductal carcinoma in situ (DCIS) of left breast  Stiffness of right shoulder, not elsewhere classified  Stiffness of left shoulder, not elsewhere classified  Localized edema  Rationale for  Evaluation and Treatment: Rehabilitation  ONSET DATE: 10/15/2023   SUBJECTIVE:                                                                                                                                                                                           SUBJECTIVE STATEMENT: My wounds are healing so well. The Rt side looks great and the Lt side is doing so well I couldn't get the Q-tip in very far to pack it. I see Dr. Cornett again this morning for another check up. And I have an appt [facility] next Wed.    PERTINENT  HISTORY:  Patient was diagnosed on 10/15/2023 with left grade 2 invasive ductal carcinoma breast cancer. It measures 1.3 cm and is located in the lower inner quadrant. It is ER/PR positive and HER2 negative with a Ki67 of 5%. She has a history of left breast DCIS in 2009 and was treated with a lumpectomy and radiation. She is s/p Bilateral Mastectomies with left SLNB and 0+/3 LN's on 11/29/2023. She has had some difficulties with wound healing and was packing one wound each side.  PATIENT GOALS:  Reassess how my recovery is going related to arm function, pain, and swelling.  PAIN:  Are you having pain? No, feeling good as far as pain  PRECAUTIONS: Recent Surgery, left UE Lymphedema risk,   RED FLAGS: None   ACTIVITY LEVEL / LEISURE: resumed laundry, fixing food for her 24 yr old dad,loading and unloading dishwasher.   OBJECTIVE:   PATIENT SURVEYS:  QUICK DASH: 18%  OBSERVATIONS: 2.4 cm wound left chest area currently packed (see photo in media), right chest multiple scabs and smaller healing wound lightly packed and covered with abd pad.  POSTURE:  Forward head and rounded shoulders posture   LYMPHEDEMA ASSESSMENT:   UPPER EXTREMITY AROM/PROM:   A/PROM RIGHT   eval   RIGHT 12/31/2023  Shoulder extension 43 50  Shoulder flexion 155 103, tight  Shoulder abduction 157 105 tight  Shoulder internal rotation 77 70  Shoulder external  rotation  84 105                          (Blank rows = not tested)   A/PROM LEFT   eval LEFT 12/31/2023  Shoulder extension 46 50  Shoulder flexion 147 132  Shoulder abduction 161 145  Shoulder internal rotation 75 75  Shoulder external rotation 90 105                          (Blank rows = not tested)   CERVICAL AROM: All within normal limits   UPPER EXTREMITY STRENGTH: WNL   LYMPHEDEMA ASSESSMENTS (in cm):    LANDMARK RIGHT   eval RIGHT 12/31/2023  10 cm proximal to olecranon process 38 37  Olecranon process 28.5 28.2  10 cm proximal to ulnar styloid process 24.5 23.3  Just proximal to ulnar styloid process 15.1 15.4  Across hand at thumb web space 17.6 19.5  At base of 2nd digit 6.2 6.3  (Blank rows = not tested)   LANDMARK LEFT   eval LEFT 12/31/2023  10 cm proximal to olecranon process 38.4 38  Olecranon process 28 28.1  10 cm proximal to ulnar styloid process 22.2 22.4  Just proximal to ulnar styloid process 15.4 15.4  Across hand at thumb web space 19.4 19.7  At base of 2nd digit 6.4 6.4  (Blank rows = not tested)     Surgery type/Date: 11/29/2023 Bilateral Mastectomy, 09/25/2007 Left Lumpectomy for DCIs Number of lymph nodes removed: 0+/3 Current/past treatment (chemo, radiation, hormone therapy): Had prior radiation with lumpectomy in 2009, Took Tamoxifen 5 yrs after lumpectomy. Will start Letrozole  Other symptoms:  Heaviness/tightness Yes, chest, arm Pain Yes Pitting edema No Infections on antibiotics due to fever 1 day Decreased scar mobility Yes Stemmer sign No    TREATMENT TODAY 02/01/24: Therapeutic Exercise Pulleys into flexion and abduction x 3 mins each with VC's to remind pt to relax during Roll yellow ball up wall into flex x 10, and then bil UE abd x 10 each returning therapist demo and VC's to decrease scap compensation Therapeutic Activities Free Motion Machine for following: Scap Retract 7# x 10, Bil UE ext (machine arms at 5) 3# x 10 Supine  over half foam roll for following: Bil UE horz abd x 10, bil UE scaption in a V x 10, and then bil UE abd in a snow angel x 10, 5 sec holds returning therapist demo for each Supine Scapular Series with yellow theraband x 5 reps each returning therapist demo, held off on Lt UE D2 due to still healing incision and did not want to create unnecessary pull here.  Manual Therapy P/ROM to Rt shoulder only today as time allowed with adding new exercises, into flex, abd and D2 with scapular depression by therapist throughout STM to Rt pectoralis tendon during P/ROM Issued nonadhesive gauze and Tegaderm for pt to place on Lt incision after she sees Dr. Vanderbilt this morning.   01/25/24: Therapeutic Exercise Pulleys into flexion and abduction x 3 mins each with demo and then VC's throughout to relax UE though pt did better with this today. Roll yellow ball up wall into flex x 10, and then bil UE abd x 5 each returning therapist demo and VC's to decrease scap compensation Modified downward dog on wall x 5 reps, 5 sec holds returning therapist demo Manual Therapy P/ROM to bil shoulders into flex, abd and  D2 with scapular depression by therapist throughout STM to bil pectoralis tendons during P/ROM, also to bil pect origins where pt reports feeling tightness MFR pressing down bil shoulders for chest stretch x 5 reps with 10 sec holds Assessed healing incisions: Rt mastectomy incision mostly closed and now just has appearance of a skin tear. Encouraged pt to stop putting bandaid on this to allow scar to form. Lt side still with small opening that she is packing. She is still using bandaids over area repotring that the paper tape seemed to irritate her skin. Today placed Tegaderm with nonadhesive gauze over Lt incision and new blister that is scabbed over and healing well inferior and lateral to incision. Issued second set of tegaderm and nonadjexive gauze for pt to use later.    01/11/24: Therapeutic  Exercise Pulleys into flexion and abduction x 3 mins each with demo and then VC's throughout to relax UE and decrease scapular compensations Roll large green ball on mat table into flex x 10, then abd x 5 each cautioning pt to be mindful not to feel pulling in Lt wound. Manual Therapy P/ROM to bil shoulders into flex, abd and D2 with scapular depression by therapist throughout STM to bil pectoralis tendons during P/ROM     PATIENT EDUCATION:  Education details: SOZO screens, need to hold scar massage until healed, have brother check wound area while exercising to be sure no opening of wound area during exs.(none noted today), ABC video watch and we will discuss Person educated: Patient Education method: Explanation, Demonstration, and Handouts Education comprehension: verbalized understanding and returned demonstration  HOME EXERCISE PROGRAM: Reviewed previously given post op HEP. Performed shoulder flexion, and stargazer x 5 in supine while observing wound area, scapular retraction, standing wall slides.   ASSESSMENT:  CLINICAL IMPRESSION: Pts incision are beginning to heal well, Rt>Lt, so was able to progress exercises lightly today. Added gentle postural strength with light weights and low reps. Then added A/ROM stretches supine over half foam roll which she tolerated well and reports feeling good, gentle stretches with these. Also added supine scapular series with yellow theraband with light reps. Pt was instructed to only perform these every other day at the most for now until Lt incision is more healed. She verbalized good understanding. Then continued Rt shoulder P/ROM/STM as time allowed for today to help further decrease muscle tightness. Pt has another small skin tear at Lt chest wall that seems to be from repetitive adhesive placement. New one from last week is healing well.   EVAL Pt is s/p Bilateral Mastectomies with left SLNB on 11/29/2023. She had a prior left lumpectomy for  DCIS in 2009. She presents with limitations in shoulder ROM, Right greater than left.  She has poor wound healing and has a 2.5 cm area on the left chest that she is packing, and a smaller, more superficial wound on the right chest area, with multiple scabs noted full length.  See Photos in Media.Wound area was observed and there was no opening of wound area noted with Pt performing AAROM for flexion and stargazer. Pt will benefit from skilled therapeutic intervention to improve on the following deficits: Decreased knowledge of precautions, impaired UE functional use, pain, decreased ROM, and postural dysfunction.   PT treatment/interventions: ADL/Self care home management, [phone]- PT Re-evaluation, 97110-Therapeutic exercises, 97530- Therapeutic activity, [id number]- Neuromuscular re-education, 97535- Self Care, [id number]- Manual therapy, [id number]- Orthotic Initial, and [id number]- Orthotic/Prosthetic subsequent   GOALS: Goals reviewed with patient? Yes  GOALS MET  AT EVAL:  GOALS Name Target Date Goal status  1 Pt will be able to verbalize understanding of pertinent lymphedema risk reduction practices relevant to her dx specifically related to skin care.  Baseline:  No knowledge Eval Achieved at eval  2 Pt will be able to return demo and/or verbalize understanding of the post op HEP related to regaining shoulder ROM. Baseline:  No knowledge Eval Achieved at eval  3 Pt will be able to verbalize understanding of the importance of viewing the post op After Breast CA Class video for further lymphedema risk reduction education and therapeutic exercise.  Baseline:  No knowledge Eval Achieved at eval   LONG TERM GOALS:  (STG=LTG)  GOALS Name Target Date  Goal status  1 Pt will demonstrate she has regained full shoulder ROM and function post operatively compared to baselines.  Baseline: 02/04/2024 INITIAL  2 Pt will watch ABC video and will have all questions answered 02/04/2024 INITIAL  3 Pt will be independent  with HEP to improve ROM and gentle strength of bilateral UE's 02/04/2024 INITIAL     PLAN:  PT FREQUENCY/DURATION: 2x/week x 5 weeks  PLAN FOR NEXT SESSION: MD renewal next session/review and update goals. Review supine scapular series, Cont to assess Lt wound area with stretches and watch for opening with ROM activities, PROM, STM and add scar tissue mobs once incisions well healed, MLD prn   [facility]  [address]  [place] [address]  [phone]    Aden Berwyn Caldron, PTA 02/01/2024, 11:17 AM   Over Head Pull: Narrow and Wide Grip   Cancer Rehab [phone]   On back, knees bent, feet flat, band across thighs, elbows straight but relaxed. Pull hands apart (start). Keeping elbows straight, bring arms up and over head, hands toward floor. Keep pull steady on band. Hold momentarily. Return slowly, keeping pull steady, back to start. Then do same with a wider grip on the band (past shoulder width) Repeat _5-10__ times. Band color __yellow____   Side Pull: Double Arm   On back, knees bent, feet flat. Arms perpendicular to body, shoulder level, elbows straight but relaxed. Pull arms out to sides, elbows straight. Resistance band comes across collarbones, hands toward floor. Hold momentarily. Slowly return to starting position. Repeat _5-10__ times. Band color _yellow____   Sword   On back, knees bent, feet flat, left hand on left hip, right hand above left. Pull right arm DIAGONALLY (hip to shoulder) across chest. Bring right arm along head toward floor. Hold momentarily. Slowly return to starting position. Repeat _5-10__ times. Do with left arm. Band color _yellow_____   Shoulder Rotation: Double Arm   On back, knees bent, feet flat, elbows tucked at sides, bent 90, hands palms up. Pull hands apart and down toward floor, keeping elbows near sides. Hold momentarily. Slowly return to starting position. Repeat _5-10__ times. Band color  __yellow____

## 2024-02-01 NOTE — Patient Instructions (Addendum)
 SABRA

## 2024-02-06 ENCOUNTER — Ambulatory Visit (INDEPENDENT_AMBULATORY_CARE_PROVIDER_SITE_OTHER)

## 2024-02-06 DIAGNOSIS — J309 Allergic rhinitis, unspecified: Secondary | ICD-10-CM | POA: Diagnosis not present

## 2024-02-13 ENCOUNTER — Ambulatory Visit

## 2024-02-13 DIAGNOSIS — J309 Allergic rhinitis, unspecified: Secondary | ICD-10-CM

## 2024-02-19 ENCOUNTER — Encounter: Payer: Self-pay | Admitting: Allergy & Immunology

## 2024-02-20 ENCOUNTER — Other Ambulatory Visit: Payer: Self-pay

## 2024-02-20 ENCOUNTER — Ambulatory Visit (HOSPITAL_COMMUNITY): Admitting: Physical Therapy

## 2024-02-20 ENCOUNTER — Ambulatory Visit: Payer: Self-pay

## 2024-02-20 DIAGNOSIS — Z9013 Acquired absence of bilateral breasts and nipples: Secondary | ICD-10-CM

## 2024-02-20 DIAGNOSIS — S21102S Unspecified open wound of left front wall of thorax without penetration into thoracic cavity, sequela: Secondary | ICD-10-CM | POA: Insufficient documentation

## 2024-02-20 DIAGNOSIS — Z86000 Personal history of in-situ neoplasm of breast: Secondary | ICD-10-CM

## 2024-02-20 NOTE — Telephone Encounter (Signed)
 This RN left a vm with call back number. Routing high priority to clinic.   Copied from CRM #[id number]. Topic: Clinical - Red Word Triage >> Feb 20, 2024  4:12 PM Victoria B wrote: [facility] that prompted transfer to Nurse Triage: caller , cindy from outpatient rehab called about patient with wound says the skin is coming out of it and wants to know where she should get it carterized

## 2024-02-20 NOTE — Therapy (Signed)
 OUTPATIENT PHYSICAL THERAPY NEURO EVALUATION   Patient Name: [name] MRN: [id number] DOB:03-24-1960, 63 y.o., female Today's Date: 02/20/2024   PCP: Leita Longs REFERRING PROVIDER: Edman Meade PEDLAR, FNP  END OF SESSION:  PT End of Session - 02/20/24 1536     Visit Number 1    Number of Visits 1    Date for Recertification  02/04/24    Authorization Type Medicaid    PT Start Time 1500    PT Stop Time 1532    PT Time Calculation (min) 32 min          Past Medical History:  Diagnosis Date   Anxiety    Asthma    Asthma due to seasonal allergies    Breast cancer (HCC) 2009   Tamoxifen, left lumpectomy   Cough    Cramps, muscle, general    Depression    Diarrhea    Diverticulosis    Eczema    Hypertension    Palpitation    Personal history of radiation therapy    Pre-diabetes    Visual disturbance    Wheezing    Past Surgical History:  Procedure Laterality Date   ABDOMINAL HYSTERECTOMY  09/10/2005   still have ovaries   BIOPSY N/A 09/08/2014   Procedure: BIOPSY random colon;  Surgeon: Margo LITTIE Haddock, MD;  Location: [facility];  Service: Endoscopy;  Laterality: N/A;   BREAST BIOPSY Right 10/2020   Breast tissue with fibrosis.  No atypia or malignancy.   BREAST BIOPSY Left 10/26/2023   US  LT BREAST BX W LOC DEV 1ST LESION IMG BX SPEC US  GUIDE 10/26/2023 [facility] MAMMOGRAPHY   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   COLONOSCOPY WITH PROPOFOL  N/A 09/08/2014   Procedure: COLONOSCOPY WITH PROPOFOL ; in cecum at 0904; withdrawal time 16 minutes;  Surgeon: Margo LITTIE Haddock, MD;  Location: [facility];  Service: Endoscopy;  Laterality: N/A;   MASTECTOMY W/ SENTINEL NODE BIOPSY Left 11/29/2023   Procedure: MASTECTOMY WITH SENTINEL LYMPH NODE BIOPSY;  Surgeon: Vanderbilt Ned, MD;  Location: [facility];  Service: General;  Laterality: Left;  GEN w/PEC BLOCK LEFT  SIMPLE MASTECTOMY LEFT SENTINEL LYMPH NODE MAPPING   TOTAL MASTECTOMY Right 11/29/2023   Procedure: MASTECTOMY, SIMPLE;  Surgeon: Vanderbilt Ned, MD;  Location:  [facility];  Service: General;  Laterality: Right;  RIGHT RISK REDUCING MASTECTOMY   Patient Active Problem List   Diagnosis Date Noted   S/P mastectomy, bilateral 01/06/2024   Well woman exam with routine gynecological exam 01/03/2024   Genetic testing 11/19/2023   Malignant neoplasm of lower-inner quadrant of left breast in female, estrogen receptor positive (HCC) 11/05/2023   Non-pitting edema 09/24/2023   Generalized rash 07/04/2023   Morbid obesity (HCC) 05/06/2023   Prediabetes 10/04/2022   Anxiety and depression 09/04/2022   Asthma 09/04/2022   Essential hypertension 09/04/2022   Hyperlipidemia 09/04/2022   Hx of migraines 09/04/2022   Abdominal cramping 09/04/2022   History of breast cancer 10/06/2021   Hot flashes 10/06/2021   Abdominal bloating 10/05/2020   Encounter for screening fecal occult blood testing 10/05/2020   Encounter for gynecological examination with Papanicolaou smear of cervix 10/05/2020   S/P abdominal supracervical subtotal hysterectomy 10/05/2020   Encounter for screening colonoscopy 08/21/2014   Anxiety state 12/13/2009   CHEST PAIN 12/13/2009   Breast cancer (HCC) 2009    ONSET DATE: 11/29/23  REFERRING  DIAG:  Diagnosis  Z90.13 (ICD-10-CM) - S/P mastectomy, bilateral    THERAPY DIAG:  Diagnosis  Z90.13 (ICD-10-CM) - S/P mastectomy, bilateral  Open wound of left chest wall, sequela [S21.102S]   Rationale for Evaluation and Treatment: Rehabilitation Subjective:  Pt states that she had a double mastectomy on 9.18.25.  Her family was taking care of the incisions and everything has healed except a small area on her left side.  She is concerned that it has not healed as it is now December.    Objective:  Observation:  there is a small opening along the incisional line that  is 0.5x0.3 cm in length.   There is tissue that is protruding from this opening that the therapist is unable to retract.  Therapist explained to the patient that she most likely needs this tissue to be cauterized to remove the tissue.  This procedure is not performed here as we only have physical therapist who specialize in wounds.  Once the tissue is removed this wound should heal up very nicely.        PATIENT EDUCATION: Education details: PT will need to return to her surgeon Person educated: Patient Education method: Explanation Education comprehension: verbalized understanding   HOME EXERCISE PROGRAM: N/A pt is receiving lymphedema therapy at a different location    GOALS: Goals reviewed with patient? Yes  SHORT TERM GOALS: Target date: 02/23/24  PT to contact surgeon for possible cauterization Baseline: Goal status: INITIAL   ASSESSMENT:  CLINICAL IMPRESSION: Patient is a 63 y.o. female who was seen today for physical therapy evaluation and treatment for a non healing surgical wound.  The opening of the wound has a piece of tissue coming out of it that most likely needs to be cauterized.  This procedure is not performed in this setting. PT referred back to surgeon or primary.    OBJECTIVE IMPAIRMENTS: non healing wound.   ACTIVITY LIMITATIONS: decreased skin integrity  REHAB POTENTIAL: Good  CLINICAL DECISION MAKING: Stable/uncomplicated  EVALUATION COMPLEXITY: Low  PLAN: PT FREQUENCY: 1x/week  PT DURATION: 1 week  PLANNED INTERVENTIONS: 97535- Self Care and Patient/Family education  PLAN FOR NEXT SESSION: one time visit  Montie Metro, PT CLT [phone]  02/20/2024, 3:38 PM

## 2024-02-21 NOTE — Telephone Encounter (Signed)
 This is not a procedure that we do in primary care, therefore she needs to follow-up with her surgeon.

## 2024-02-21 NOTE — Telephone Encounter (Signed)
 Cindy with [facility] calling back in today, states she missed a call this morning from RN Arissa while she was in with a patient. Unable to located documentation of call made this AM, RN documented missed call yesterday evening and voicemail was left at that time. Message was routed to PCP this AM by clinic staff. Dorthea states: the patient's mastectomy wound has tissue protruding along the incisional line. She has attempted retraction and it has not been successful. The wound requires cauterization and Dorthea would like PCP aware. If PCP is able to cauterize the wound or if the patient needs to follow up with the surgeon. Please advise. 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

## 2024-02-21 NOTE — Telephone Encounter (Signed)
 Message sent to patient

## 2024-02-22 ENCOUNTER — Ambulatory Visit: Attending: Surgery

## 2024-02-22 ENCOUNTER — Ambulatory Visit

## 2024-02-22 DIAGNOSIS — Z86000 Personal history of in-situ neoplasm of breast: Secondary | ICD-10-CM

## 2024-02-22 DIAGNOSIS — R293 Abnormal posture: Secondary | ICD-10-CM | POA: Insufficient documentation

## 2024-02-22 DIAGNOSIS — C50312 Malignant neoplasm of lower-inner quadrant of left female breast: Secondary | ICD-10-CM | POA: Diagnosis present

## 2024-02-22 DIAGNOSIS — M25612 Stiffness of left shoulder, not elsewhere classified: Secondary | ICD-10-CM | POA: Diagnosis present

## 2024-02-22 DIAGNOSIS — J309 Allergic rhinitis, unspecified: Secondary | ICD-10-CM | POA: Diagnosis not present

## 2024-02-22 DIAGNOSIS — M25611 Stiffness of right shoulder, not elsewhere classified: Secondary | ICD-10-CM

## 2024-02-22 DIAGNOSIS — Z170 Estrogen receptor positive status [ER+]: Secondary | ICD-10-CM | POA: Insufficient documentation

## 2024-02-22 DIAGNOSIS — R600 Localized edema: Secondary | ICD-10-CM | POA: Diagnosis present

## 2024-02-22 NOTE — Therapy (Signed)
 OUTPATIENT PHYSICAL THERAPY BREAST CANCER TREATMENT   Patient Name: [name] MRN: [id number] DOB:1960/11/04, 63 y.o., female Today's Date: 02/22/2024  END OF SESSION:  PT End of Session - 02/22/24 0905     Visit Number 7    Number of Visits 12    Date for Recertification  04/04/24    Authorization Type Medicaid    Authorization Time Period 10/20-12/19/2025; date extension requested    Authorization - Visit Number 6    Authorization - Number of Visits 12    PT Start Time 0902    PT Stop Time 1010    PT Time Calculation (min) 68 min    Activity Tolerance Patient tolerated treatment well    Behavior During Therapy [facility] for tasks assessed/performed          Past Medical History:  Diagnosis Date   Anxiety    Asthma    Asthma due to seasonal allergies    Breast cancer (HCC) 2009   Tamoxifen, left lumpectomy   Cough    Cramps, muscle, general    Depression    Diarrhea    Diverticulosis    Eczema    Hypertension    Palpitation    Personal history of radiation therapy    Pre-diabetes    Visual disturbance    Wheezing    Past Surgical History:  Procedure Laterality Date   ABDOMINAL HYSTERECTOMY  09/10/2005   still have ovaries   BIOPSY N/A 09/08/2014   Procedure: BIOPSY random colon;  Surgeon: Margo LITTIE Haddock, MD;  Location: [facility];  Service: Endoscopy;  Laterality: N/A;   BREAST BIOPSY Right 10/2020   Breast tissue with fibrosis.  No atypia or malignancy.   BREAST BIOPSY Left 10/26/2023   US  LT BREAST BX W LOC DEV 1ST LESION IMG BX SPEC US  GUIDE 10/26/2023 [facility] MAMMOGRAPHY   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   COLONOSCOPY WITH PROPOFOL  N/A 09/08/2014   Procedure: COLONOSCOPY WITH PROPOFOL ; in cecum at 0904; withdrawal time 16 minutes;  Surgeon: Margo LITTIE Haddock, MD;  Location: [facility];  Service: Endoscopy;  Laterality: N/A;   MASTECTOMY W/ SENTINEL NODE BIOPSY  Left 11/29/2023   Procedure: MASTECTOMY WITH SENTINEL LYMPH NODE BIOPSY;  Surgeon: Vanderbilt Ned, MD;  Location: [facility];  Service: General;  Laterality: Left;  GEN w/PEC BLOCK LEFT SIMPLE MASTECTOMY LEFT SENTINEL LYMPH NODE MAPPING   TOTAL MASTECTOMY Right 11/29/2023   Procedure: MASTECTOMY, SIMPLE;  Surgeon: Vanderbilt Ned, MD;  Location: [facility];  Service: General;  Laterality: Right;  RIGHT RISK REDUCING MASTECTOMY   Patient Active Problem List   Diagnosis Date Noted   S/P mastectomy, bilateral 01/06/2024   Well woman exam with routine gynecological exam 01/03/2024   Genetic testing 11/19/2023   Malignant neoplasm of lower-inner quadrant of left breast in female, estrogen receptor positive (HCC) 11/05/2023   Non-pitting edema 09/24/2023   Generalized rash 07/04/2023   Morbid obesity (HCC) 05/06/2023   Prediabetes 10/04/2022   Anxiety and depression 09/04/2022   Asthma 09/04/2022   Essential hypertension 09/04/2022   Hyperlipidemia 09/04/2022   Hx of migraines 09/04/2022   Abdominal cramping 09/04/2022   History of breast cancer 10/06/2021   Hot flashes 10/06/2021   Abdominal bloating 10/05/2020   Encounter for screening fecal occult blood testing 10/05/2020   Encounter for gynecological examination with Papanicolaou smear of cervix 10/05/2020   S/P  abdominal supracervical subtotal hysterectomy 10/05/2020   Encounter for screening colonoscopy 08/21/2014   Anxiety state 12/13/2009   CHEST PAIN 12/13/2009   Breast cancer (HCC) 2009    PCP:    REFERRING PROVIDER: Dr. Debby Shipper   REFERRING DIAG: Left breast cancer   THERAPY DIAG:  Malignant neoplasm of lower-inner quadrant of left breast in female, estrogen receptor positive (HCC)  Abnormal posture  History of ductal carcinoma in situ (DCIS) of left breast  Stiffness of right shoulder, not elsewhere classified  Stiffness of left shoulder, not elsewhere classified  Localized  edema  Rationale for Evaluation and Treatment: Rehabilitation  ONSET DATE: 10/15/2023   SUBJECTIVE:                                                                                                                                                                                           SUBJECTIVE STATEMENT: I saw a wound PT on 12/10. I have one area on my Lt incision that there is some tissue protruding so they sent me to her and she said everything has healed well except the area where the tissue is protruding. She said the doctor will have to repair that so I've called them and hope they can have me come by today. I've been keeping up with my stretches but my chest feels so tight. I don't really have pain except I did have a sharp, radiating pain along my Rt intercostals Sat morning. It lasted less than 30 min but it did wake me up.    PERTINENT  HISTORY:  Patient was diagnosed on 10/15/2023 with left grade 2 invasive ductal carcinoma breast cancer. It measures 1.3 cm and is located in the lower inner quadrant. It is ER/PR positive and HER2 negative with a Ki67 of 5%. She has a history of left breast DCIS in 2009 and was treated with a lumpectomy and radiation. She is s/p Bilateral Mastectomies with left SLNB and 0+/3 LN's on 11/29/2023. She has had some difficulties with wound healing and was packing one wound each side.  PATIENT GOALS:  Reassess how my recovery is going related to arm function, pain, and swelling.  PAIN:  Are you having pain? No, just really tight in my chest, Rt>Lt  PRECAUTIONS: Recent Surgery, left UE Lymphedema risk,   RED FLAGS: None   ACTIVITY LEVEL / LEISURE: resumed laundry, fixing food for her 14 yr old dad,loading and unloading dishwasher.   OBJECTIVE:   PATIENT SURVEYS:  QUICK DASH: 18%  OBSERVATIONS: 2.4 cm wound left chest area currently packed (see photo in media), right chest multiple scabs and smaller healing wound lightly packed  and covered with abd  pad.  POSTURE:  Forward head and rounded shoulders posture   LYMPHEDEMA ASSESSMENT:   UPPER EXTREMITY AROM/PROM:   A/PROM RIGHT   eval   RIGHT 12/31/2023 Right 02/22/24  Shoulder extension 43 50   Shoulder flexion 155 103, tight 151  Shoulder abduction 157 105 tight 143  Shoulder internal rotation 77 70   Shoulder external rotation 84 105                           (Blank rows = not tested)   A/PROM LEFT   eval LEFT 12/31/2023 Left 02/22/24  Shoulder extension 46 50   Shoulder flexion 147 132 158  Shoulder abduction 161 145 146  Shoulder internal rotation 75 75   Shoulder external rotation 90 105                           (Blank rows = not tested)   CERVICAL AROM: All within normal limits   UPPER EXTREMITY STRENGTH: WNL   LYMPHEDEMA ASSESSMENTS (in cm):    LANDMARK RIGHT   eval RIGHT 12/31/2023  10 cm proximal to olecranon process 38 37  Olecranon process 28.5 28.2  10 cm proximal to ulnar styloid process 24.5 23.3  Just proximal to ulnar styloid process 15.1 15.4  Across hand at thumb web space 17.6 19.5  At base of 2nd digit 6.2 6.3  (Blank rows = not tested)   LANDMARK LEFT   eval LEFT 12/31/2023  10 cm proximal to olecranon process 38.4 38  Olecranon process 28 28.1  10 cm proximal to ulnar styloid process 22.2 22.4  Just proximal to ulnar styloid process 15.4 15.4  Across hand at thumb web space 19.4 19.7  At base of 2nd digit 6.4 6.4  (Blank rows = not tested)     Surgery type/Date: 11/29/2023 Bilateral Mastectomy, 09/25/2007 Left Lumpectomy for DCIs Number of lymph nodes removed: 0+/3 Current/past treatment (chemo, radiation, hormone therapy): Had prior radiation with lumpectomy in 2009, Took Tamoxifen 5 yrs after lumpectomy. Will start Letrozole  Other symptoms:  Heaviness/tightness Yes, chest, arm Pain Yes Pitting edema No Infections on antibiotics due to fever 1 day Decreased scar mobility Yes Stemmer sign No    TREATMENT  TODAY 02/22/24: Therapeutic Exercise Pulleys into flexion and abduction x 3 mins each with VC's to remind pt hot to decrease scapular compensations Roll yellow ball up wall into flex x 10, and then bil UE abd x 10 each returning therapist demo and VC's to decrease scap compensation Therapeutic Activities Free Motion Machine for following: Scap Retract 7# x 10, Bil UE ext (machine arms at 5) 3# x 10 Supine over half foam roll for following: Bil UE horz abd x 10, bil UE scaption in a V x 10, and then bil UE abd in a snow angel x 10, 5 sec holds returning therapist demo for each Supine Scapular Series with red theraband x 5 each, pt tolerated increased resistance well so issued red band for home use. Min tactile and VC's to remind of correct technique Manual Therapy P/ROM to bil shoulders, into flex, abd and D2 with scapular depression by therapist throughout STM to Rt pectoralis tendon during P/ROM MFR to Rt chest wall now that this incision is closed, still not to Lt with tissue protrusion and slight drainage still present  02/01/24: Therapeutic Exercise Pulleys into flexion and abduction x 3 mins each  with VC's to remind pt to relax during Roll yellow ball up wall into flex x 10, and then bil UE abd x 10 each returning therapist demo and VC's to decrease scap compensation Therapeutic Activities Free Motion Machine for following: Scap Retract 7# x 10, Bil UE ext (machine arms at 5) 3# x 10 Supine over half foam roll for following: Bil UE horz abd x 10, bil UE scaption in a V x 10, and then bil UE abd in a snow angel x 10, 5 sec holds returning therapist demo for each Supine Scapular Series with yellow theraband x 5 reps each returning therapist demo, held off on Lt UE D2 due to still healing incision and did not want to create unnecessary pull here.  Manual Therapy P/ROM to Rt shoulder only today as time allowed with adding new exercises, into flex, abd and D2 with scapular depression  by therapist throughout STM to Rt pectoralis tendon during P/ROM Issued nonadhesive gauze and Tegaderm for pt to place on Lt incision after she sees Dr. Vanderbilt this morning.   01/25/24: Therapeutic Exercise Pulleys into flexion and abduction x 3 mins each with demo and then VC's throughout to relax UE though pt did better with this today. Roll yellow ball up wall into flex x 10, and then bil UE abd x 5 each returning therapist demo and VC's to decrease scap compensation Modified downward dog on wall x 5 reps, 5 sec holds returning therapist demo Manual Therapy P/ROM to bil shoulders into flex, abd and D2 with scapular depression by therapist throughout STM to bil pectoralis tendons during P/ROM, also to bil pect origins where pt reports feeling tightness MFR pressing down bil shoulders for chest stretch x 5 reps with 10 sec holds Assessed healing incisions: Rt mastectomy incision mostly closed and now just has appearance of a skin tear. Encouraged pt to stop putting bandaid on this to allow scar to form. Lt side still with small opening that she is packing. She is still using bandaids over area repotring that the paper tape seemed to irritate her skin. Today placed Tegaderm with nonadhesive gauze over Lt incision and new blister that is scabbed over and healing well inferior and lateral to incision. Issued second set of tegaderm and nonadjexive gauze for pt to use later.    01/11/24: Therapeutic Exercise Pulleys into flexion and abduction x 3 mins each with demo and then VC's throughout to relax UE and decrease scapular compensations Roll large green ball on mat table into flex x 10, then abd x 5 each cautioning pt to be mindful not to feel pulling in Lt wound. Manual Therapy P/ROM to bil shoulders into flex, abd and D2 with scapular depression by therapist throughout STM to bil pectoralis tendons during P/ROM     PATIENT EDUCATION:  Education details: SOZO screens, need to hold scar  massage until healed, have brother check wound area while exercising to be sure no opening of wound area during exs.(none noted today), ABC video watch and we will discuss Person educated: Patient Education method: Explanation, Demonstration, and Handouts Education comprehension: verbalized understanding and returned demonstration  HOME EXERCISE PROGRAM: Reviewed previously given post op HEP. Performed shoulder flexion, and stargazer x 5 in supine while observing wound area, scapular retraction, standing wall slides.   ASSESSMENT:  CLINICAL IMPRESSION: Pt returns after awaiting incisions to heal more. She saw wound PT Wednesday and they said overall she is healed well except one area of protruding tissue at [facility]  incision. She has already reached out to her surgeon to see if they can cauterize this. Today resumed exercises with AA/ROM stretching and strengthening. Progressed HEP to red theraband and then also continued with manual therapy to Rt side where she feels most tightness. MD renewal done and request sent to Medicaid for more time, not visits as we have 5 left.   EVAL Pt is s/p Bilateral Mastectomies with left SLNB on 11/29/2023. She had a prior left lumpectomy for DCIS in 2009. She presents with limitations in shoulder ROM, Right greater than left.  She has poor wound healing and has a 2.5 cm area on the left chest that she is packing, and a smaller, more superficial wound on the right chest area, with multiple scabs noted full length.  See Photos in Media.Wound area was observed and there was no opening of wound area noted with Pt performing AAROM for flexion and stargazer. Pt will benefit from skilled therapeutic intervention to improve on the following deficits: Decreased knowledge of precautions, impaired UE functional use, pain, decreased ROM, and postural dysfunction.   PT treatment/interventions: ADL/Self care home management, [phone]- PT Re-evaluation, 97110-Therapeutic exercises, 97530-  Therapeutic activity, [id number]- Neuromuscular re-education, 97535- Self Care, [id number]- Manual therapy, [id number]- Orthotic Initial, and [id number]- Orthotic/Prosthetic subsequent   GOALS: Goals reviewed with patient? Yes  GOALS MET AT EVAL:  GOALS Name Target Date Goal status  1 Pt will be able to verbalize understanding of pertinent lymphedema risk reduction practices relevant to her dx specifically related to skin care.  Baseline:  No knowledge Eval Achieved at eval  2 Pt will be able to return demo and/or verbalize understanding of the post op HEP related to regaining shoulder ROM. Baseline:  No knowledge Eval Achieved at eval  3 Pt will be able to verbalize understanding of the importance of viewing the post op After Breast CA Class video for further lymphedema risk reduction education and therapeutic exercise.  Baseline:  No knowledge Eval Achieved at eval   LONG TERM GOALS:  (STG=LTG)  GOALS Name Target Date  Goal status  1 Pt will demonstrate she has regained full shoulder ROM and function post operatively compared to baselines.  Baseline: 04/04/2024 PARTIALLY MET 02/22/24 see above measurements  2 Pt will watch ABC video and will have all questions answered 04/04/2024 ONGOING Pt to watch this soon, she had forgotten about video  3 Pt will be independent with HEP to improve ROM and gentle strength of bilateral UE's 02/04/2024 ONGOING 02/22/24 - pt is independent with current HEP, will be able to progress her now that incisions are mostly healed     PLAN:  PT FREQUENCY/DURATION: 1x/week x 6 weeks  PLAN FOR NEXT SESSION: MD renewal done today and date extension requested from Medicaid; Review supine scapular series, Cont to assess Lt wound area with stretches and watch for opening with ROM activities, PROM, STM and add scar tissue mobs once incisions well healed, MLD prn   [facility]  [address], [address]  [place] [address]  [phone]    Aden Berwyn Caldron, PTA 02/22/2024, 10:17 AM   Over Head Pull: Narrow and Wide Grip   [facility] [phone]   On back, knees bent, feet flat, band across thighs, elbows straight but relaxed. Pull hands apart (start). Keeping elbows straight, bring arms up and over head, hands toward floor. Keep pull steady on band. Hold momentarily. Return slowly, keeping pull steady, back to start. Then do same with a  wider grip on the band (past shoulder width) Repeat _5-10__ times. Band color __yellow____   Side Pull: Double Arm   On back, knees bent, feet flat. Arms perpendicular to body, shoulder level, elbows straight but relaxed. Pull arms out to sides, elbows straight. Resistance band comes across collarbones, hands toward floor. Hold momentarily. Slowly return to starting position. Repeat _5-10__ times. Band color _yellow____   Sword   On back, knees bent, feet flat, left hand on left hip, right hand above left. Pull right arm DIAGONALLY (hip to shoulder) across chest. Bring right arm along head toward floor. Hold momentarily. Slowly return to starting position. Repeat _5-10__ times. Do with left arm. Band color _yellow_____   Shoulder Rotation: Double Arm   On back, knees bent, feet flat, elbows tucked at sides, bent 90, hands palms up. Pull hands apart and down toward floor, keeping elbows near sides. Hold momentarily. Slowly return to starting position. Repeat _5-10__ times. Band color __yellow____

## 2024-02-25 ENCOUNTER — Ambulatory Visit (HOSPITAL_COMMUNITY)

## 2024-02-28 ENCOUNTER — Other Ambulatory Visit: Payer: Self-pay

## 2024-02-28 ENCOUNTER — Ambulatory Visit (HOSPITAL_COMMUNITY): Admitting: Physical Therapy

## 2024-02-28 DIAGNOSIS — F419 Anxiety disorder, unspecified: Secondary | ICD-10-CM

## 2024-02-29 ENCOUNTER — Ambulatory Visit

## 2024-02-29 DIAGNOSIS — Z170 Estrogen receptor positive status [ER+]: Secondary | ICD-10-CM

## 2024-02-29 DIAGNOSIS — M25611 Stiffness of right shoulder, not elsewhere classified: Secondary | ICD-10-CM

## 2024-02-29 DIAGNOSIS — Z86000 Personal history of in-situ neoplasm of breast: Secondary | ICD-10-CM

## 2024-02-29 DIAGNOSIS — R600 Localized edema: Secondary | ICD-10-CM

## 2024-02-29 DIAGNOSIS — R293 Abnormal posture: Secondary | ICD-10-CM

## 2024-02-29 DIAGNOSIS — C50312 Malignant neoplasm of lower-inner quadrant of left female breast: Secondary | ICD-10-CM | POA: Diagnosis not present

## 2024-02-29 DIAGNOSIS — M25612 Stiffness of left shoulder, not elsewhere classified: Secondary | ICD-10-CM

## 2024-02-29 NOTE — Therapy (Addendum)
 " OUTPATIENT PHYSICAL THERAPY BREAST CANCER TREATMENT   Patient Name: [name] MRN: [id number] DOB:02-Sep-1960, 63 y.o., female Today's Date: 02/29/2024  END OF SESSION:  PT End of Session - 02/29/24 0810     Visit Number 8    Number of Visits 12    Date for Recertification  04/04/24    Authorization Type Medicaid    Authorization Time Period 10/20-12/19/2025; [facility] authorized a date extension 12/31/23-03/30/24    Authorization - Visit Number 7    Authorization - Number of Visits 12    PT Start Time 0802    PT Stop Time 0858    PT Time Calculation (min) 56 min    Activity Tolerance Patient tolerated treatment well    Behavior During Therapy [facility] for tasks assessed/performed          Past Medical History:  Diagnosis Date   Anxiety    Asthma    Asthma due to seasonal allergies    Breast cancer (HCC) 2009   Tamoxifen, left lumpectomy   Cough    Cramps, muscle, general    Depression    Diarrhea    Diverticulosis    Eczema    Hypertension    Palpitation    Personal history of radiation therapy    Pre-diabetes    Visual disturbance    Wheezing    Past Surgical History:  Procedure Laterality Date   ABDOMINAL HYSTERECTOMY  09/10/2005   still have ovaries   BIOPSY N/A 09/08/2014   Procedure: BIOPSY random colon;  Surgeon: Margo LITTIE Haddock, MD;  Location: [facility];  Service: Endoscopy;  Laterality: N/A;   BREAST BIOPSY Right 10/2020   Breast tissue with fibrosis.  No atypia or malignancy.   BREAST BIOPSY Left 10/26/2023   US  LT BREAST BX W LOC DEV 1ST LESION IMG BX SPEC US  GUIDE 10/26/2023 [facility] MAMMOGRAPHY   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   COLONOSCOPY WITH PROPOFOL  N/A 09/08/2014   Procedure: COLONOSCOPY WITH PROPOFOL ; in cecum at 0904; withdrawal time 16 minutes;  Surgeon: Margo LITTIE Haddock, MD;  Location: [facility];  Service: Endoscopy;  Laterality: N/A;   MASTECTOMY  W/ SENTINEL NODE BIOPSY Left 11/29/2023   Procedure: MASTECTOMY WITH SENTINEL LYMPH NODE BIOPSY;  Surgeon: Vanderbilt Ned, MD;  Location: [facility];  Service: General;  Laterality: Left;  GEN w/PEC BLOCK LEFT SIMPLE MASTECTOMY LEFT SENTINEL LYMPH NODE MAPPING   TOTAL MASTECTOMY Right 11/29/2023   Procedure: MASTECTOMY, SIMPLE;  Surgeon: Vanderbilt Ned, MD;  Location: [facility];  Service: General;  Laterality: Right;  RIGHT RISK REDUCING MASTECTOMY   Patient Active Problem List   Diagnosis Date Noted   S/P mastectomy, bilateral 01/06/2024   Well woman exam with routine gynecological exam 01/03/2024   Genetic testing 11/19/2023   Malignant neoplasm of lower-inner quadrant of left breast in female, estrogen receptor positive (HCC) 11/05/2023   Non-pitting edema 09/24/2023   Generalized rash 07/04/2023   Morbid obesity (HCC) 05/06/2023   Prediabetes 10/04/2022   Anxiety and depression 09/04/2022   Asthma 09/04/2022   Essential hypertension 09/04/2022   Hyperlipidemia 09/04/2022   Hx of migraines 09/04/2022   Abdominal cramping 09/04/2022   History of breast cancer 10/06/2021   Hot flashes 10/06/2021   Abdominal bloating 10/05/2020   Encounter for screening fecal occult blood testing 10/05/2020   Encounter for gynecological examination with Papanicolaou smear of cervix  10/05/2020   S/P abdominal supracervical subtotal hysterectomy 10/05/2020   Encounter for screening colonoscopy 08/21/2014   Anxiety state 12/13/2009   CHEST PAIN 12/13/2009   Breast cancer (HCC) 2009    PCP:    REFERRING PROVIDER: Dr. Debby Shipper   REFERRING DIAG: Left breast cancer   THERAPY DIAG:  Malignant neoplasm of lower-inner quadrant of left breast in female, estrogen receptor positive (HCC)  Abnormal posture  History of ductal carcinoma in situ (DCIS) of left breast  Stiffness of right shoulder, not elsewhere classified  Stiffness of left shoulder, not elsewhere  classified  Localized edema  Rationale for Evaluation and Treatment: Rehabilitation  ONSET DATE: 10/15/2023   SUBJECTIVE:                                                                                                                                                                                           SUBJECTIVE STATEMENT: I saw the surgeon Monday and Dr. Shipper used silver nitrate to cauterize the extra tissue at the Lt incision. That is healing really well, just looks a bit burnt.    PERTINENT  HISTORY:  Patient was diagnosed on 10/15/2023 with left grade 2 invasive ductal carcinoma breast cancer. It measures 1.3 cm and is located in the lower inner quadrant. It is ER/PR positive and HER2 negative with a Ki67 of 5%. She has a history of left breast DCIS in 2009 and was treated with a lumpectomy and radiation. She is s/p Bilateral Mastectomies with left SLNB and 0+/3 LN's on 11/29/2023. She has had some difficulties with wound healing and was packing one wound each side.  PATIENT GOALS:  Reassess how my recovery is going related to arm function, pain, and swelling.  PAIN:  Are you having pain? No, just tight in my chest, Rt>Lt  PRECAUTIONS: Recent Surgery, left UE Lymphedema risk,   RED FLAGS: None   ACTIVITY LEVEL / LEISURE: resumed laundry, fixing food for her 81 yr old dad,loading and unloading dishwasher.   OBJECTIVE:   PATIENT SURVEYS:  QUICK DASH: 18%  OBSERVATIONS: 2.4 cm wound left chest area currently packed (see photo in media), right chest multiple scabs and smaller healing wound lightly packed and covered with abd pad.  POSTURE:  Forward head and rounded shoulders posture   LYMPHEDEMA ASSESSMENT:   UPPER EXTREMITY AROM/PROM:   A/PROM RIGHT   eval   RIGHT 12/31/2023 Right 02/22/24  Shoulder extension 43 50   Shoulder flexion 155 103, tight 151  Shoulder abduction 157 105 tight 143  Shoulder internal rotation 77 70   Shoulder external rotation 84 105                            (  Blank rows = not tested)   A/PROM LEFT   eval LEFT 12/31/2023 Left 02/22/24  Shoulder extension 46 50   Shoulder flexion 147 132 158  Shoulder abduction 161 145 146  Shoulder internal rotation 75 75   Shoulder external rotation 90 105                           (Blank rows = not tested)   CERVICAL AROM: All within normal limits   UPPER EXTREMITY STRENGTH: WNL   LYMPHEDEMA ASSESSMENTS (in cm):    LANDMARK RIGHT   eval RIGHT 12/31/2023  10 cm proximal to olecranon process 38 37  Olecranon process 28.5 28.2  10 cm proximal to ulnar styloid process 24.5 23.3  Just proximal to ulnar styloid process 15.1 15.4  Across hand at thumb web space 17.6 19.5  At base of 2nd digit 6.2 6.3  (Blank rows = not tested)   LANDMARK LEFT   eval LEFT 12/31/2023  10 cm proximal to olecranon process 38.4 38  Olecranon process 28 28.1  10 cm proximal to ulnar styloid process 22.2 22.4  Just proximal to ulnar styloid process 15.4 15.4  Across hand at thumb web space 19.4 19.7  At base of 2nd digit 6.4 6.4  (Blank rows = not tested)     Surgery type/Date: 11/29/2023 Bilateral Mastectomy, 09/25/2007 Left Lumpectomy for DCIs Number of lymph nodes removed: 0+/3 Current/past treatment (chemo, radiation, hormone therapy): Had prior radiation with lumpectomy in 2009, Took Tamoxifen 5 yrs after lumpectomy. Will start Letrozole  Other symptoms:  Heaviness/tightness Yes, chest, arm Pain Yes Pitting edema No Infections on antibiotics due to fever 1 day Decreased scar mobility Yes Stemmer sign No    TREATMENT TODAY 02/29/24: Therapeutic Exercise Pulleys into flexion and abduction x 3 mins each with VC's to remind pt hot to decrease scapular compensations Roll yellow ball up wall into flex x 10, and then bil UE abd x 10 each returning therapist demo and VC's to decrease scap compensation Therapeutic Activities Free Motion Machine for following: Scap Retract 7# 2 x 10,  Bil UE ext (machine arms at 5) 3# x 10 Supine over half foam roll for following: Bil UE horz abd x 10, bil UE scaption in a V x 10, and then bil UE abd in a snow angel x 10, 5 sec holds returning therapist demo for each, alt UE flexion x 10 each Manual Therapy P/ROM to bil shoulders, into flex, abd and D2 with scapular depression by therapist throughout STM to Rt pectoralis tendon during P/ROM, gently to Lt being mindful of healing Lt incision; cupping to palpable cord in Rt forearm during MFR superior and inferior to cup on cord MFR to Rt chest wall now that this incision is closed, still not to Lt with tissue protrusion and slight drainage still present After session applied Tegaderm with nonadhesive gauze applied where cauterization to Lt incision Monday  02/22/24: Therapeutic Exercise Pulleys into flexion and abduction x 3 mins each with VC's to remind pt hot to decrease scapular compensations Roll yellow ball up wall into flex x 10, and then bil UE abd x 10 each returning therapist demo and VC's to decrease scap compensation Therapeutic Activities Free Motion Machine for following: Scap Retract 7# x 10, Bil UE ext (machine arms at 5) 3# x 10 Supine over half foam roll for following: Bil UE horz abd x 10, bil UE scaption in a V x 10, and  then bil UE abd in a snow angel x 10, 5 sec holds returning therapist demo for each Supine Scapular Series with red theraband x 5 each, pt tolerated increased resistance well so issued red band for home use. Min tactile and VC's to remind of correct technique Manual Therapy P/ROM to bil shoulders, into flex, abd and D2 with scapular depression by therapist throughout STM to Rt pectoralis tendon during P/ROM MFR to Rt chest wall now that this incision is closed, still not to Lt with tissue protrusion and slight drainage still present  02/01/24: Therapeutic Exercise Pulleys into flexion and abduction x 3 mins each with VC's to remind pt to relax  during Roll yellow ball up wall into flex x 10, and then bil UE abd x 10 each returning therapist demo and VC's to decrease scap compensation Therapeutic Activities Free Motion Machine for following: Scap Retract 7# x 10, Bil UE ext (machine arms at 5) 3# x 10 Supine over half foam roll for following: Bil UE horz abd x 10, bil UE scaption in a V x 10, and then bil UE abd in a snow angel x 10, 5 sec holds returning therapist demo for each Supine Scapular Series with yellow theraband x 5 reps each returning therapist demo, held off on Lt UE D2 due to still healing incision and did not want to create unnecessary pull here.  Manual Therapy P/ROM to Rt shoulder only today as time allowed with adding new exercises, into flex, abd and D2 with scapular depression by therapist throughout STM to Rt pectoralis tendon during P/ROM Issued nonadhesive gauze and Tegaderm for pt to place on Lt incision after she sees Dr. Vanderbilt this morning.         PATIENT EDUCATION:  Education details: SOZO screens, need to hold scar massage until healed, have brother check wound area while exercising to be sure no opening of wound area during exs.(none noted today), ABC video watch and we will discuss Person educated: Patient Education method: Explanation, Demonstration, and Handouts Education comprehension: verbalized understanding and returned demonstration  HOME EXERCISE PROGRAM: Reviewed previously given post op HEP. Performed shoulder flexion, and stargazer x 5 in supine while observing wound area, scapular retraction, standing wall slides.   ASSESSMENT:  CLINICAL IMPRESSION: Pt approved for date extension with Medicaid. Continued with postural strength and manual therapy. New cording noted in Rt forearm so added cupping to forearm over this and pt reports feeling a good stretch here with this.   EVAL Pt is s/p Bilateral Mastectomies with left SLNB on 11/29/2023. She had a prior left lumpectomy for DCIS  in 2009. She presents with limitations in shoulder ROM, Right greater than left.  She has poor wound healing and has a 2.5 cm area on the left chest that she is packing, and a smaller, more superficial wound on the right chest area, with multiple scabs noted full length.  See Photos in Media.Wound area was observed and there was no opening of wound area noted with Pt performing AAROM for flexion and stargazer. Pt will benefit from skilled therapeutic intervention to improve on the following deficits: Decreased knowledge of precautions, impaired UE functional use, pain, decreased ROM, and postural dysfunction.   PT treatment/interventions: ADL/Self care home management, [id number]- PT Re-evaluation, 97110-Therapeutic exercises, 97530- Therapeutic activity, [id number]- Neuromuscular re-education, 97535- Self Care, [id number]- Manual therapy, [id number]- Orthotic Initial, and [id number]- Orthotic/Prosthetic subsequent   GOALS: Goals reviewed with patient? Yes  GOALS MET AT EVAL:  GOALS Name Target Date  Goal status  1 Pt will be able to verbalize understanding of pertinent lymphedema risk reduction practices relevant to her dx specifically related to skin care.  Baseline:  No knowledge Eval Achieved at eval  2 Pt will be able to return demo and/or verbalize understanding of the post op HEP related to regaining shoulder ROM. Baseline:  No knowledge Eval Achieved at eval  3 Pt will be able to verbalize understanding of the importance of viewing the post op After Breast CA Class video for further lymphedema risk reduction education and therapeutic exercise.  Baseline:  No knowledge Eval Achieved at eval   LONG TERM GOALS:  (STG=LTG)  GOALS Name Target Date  Goal status  1 Pt will demonstrate she has regained full shoulder ROM and function post operatively compared to baselines.  Baseline: 04/04/2024 PARTIALLY MET 02/22/24 see above measurements  2 Pt will watch ABC video and will have all questions answered 04/04/2024  ONGOING Pt to watch this soon, she had forgotten about video  3 Pt will be independent with HEP to improve ROM and gentle strength of bilateral UE's 02/04/2024 ONGOING 02/22/24 - pt is independent with current HEP, will be able to progress her now that incisions are mostly healed     PLAN:  PT FREQUENCY/DURATION: 1x/week x 6 weeks  PLAN FOR NEXT SESSION: Cont to assess Lt wound area with stretches and watch for opening with ROM activities, PROM, STM and add scar tissue mobs once incisions well healed, MLD prn   [facility]  [address]  [place] [address]  [phone]    Aden Berwyn Caldron, PTA 02/29/2024, 9:28 AM   Over Head Pull: Narrow and Wide Grip   Cancer Rehab [phone]   On back, knees bent, feet flat, band across thighs, elbows straight but relaxed. Pull hands apart (start). Keeping elbows straight, bring arms up and over head, hands toward floor. Keep pull steady on band. Hold momentarily. Return slowly, keeping pull steady, back to start. Then do same with a wider grip on the band (past shoulder width) Repeat _5-10__ times. Band color __yellow____   Side Pull: Double Arm   On back, knees bent, feet flat. Arms perpendicular to body, shoulder level, elbows straight but relaxed. Pull arms out to sides, elbows straight. Resistance band comes across collarbones, hands toward floor. Hold momentarily. Slowly return to starting position. Repeat _5-10__ times. Band color _yellow____   Sword   On back, knees bent, feet flat, left hand on left hip, right hand above left. Pull right arm DIAGONALLY (hip to shoulder) across chest. Bring right arm along head toward floor. Hold momentarily. Slowly return to starting position. Repeat _5-10__ times. Do with left arm. Band color _yellow_____   Shoulder Rotation: Double Arm   On back, knees bent, feet flat, elbows tucked at sides, bent 90, hands palms up. Pull hands apart and down toward  floor, keeping elbows near sides. Hold momentarily. Slowly return to starting position. Repeat _5-10__ times. Band color __yellow____  "

## 2024-03-03 ENCOUNTER — Ambulatory Visit (HOSPITAL_COMMUNITY): Admitting: Physical Therapy

## 2024-03-11 ENCOUNTER — Ambulatory Visit (HOSPITAL_COMMUNITY): Admitting: Physical Therapy

## 2024-03-11 ENCOUNTER — Other Ambulatory Visit: Payer: Self-pay

## 2024-03-11 MED ORDER — OSELTAMIVIR PHOSPHATE 75 MG PO CAPS: 75.0000 mg | ORAL_CAPSULE | Freq: Two times a day (BID) | ORAL | 0 refills | Status: AC

## 2024-03-19 ENCOUNTER — Ambulatory Visit

## 2024-03-19 DIAGNOSIS — J302 Other seasonal allergic rhinitis: Secondary | ICD-10-CM | POA: Diagnosis not present

## 2024-03-20 ENCOUNTER — Other Ambulatory Visit: Payer: Self-pay | Admitting: Family Medicine

## 2024-03-20 ENCOUNTER — Other Ambulatory Visit: Payer: Self-pay

## 2024-03-27 ENCOUNTER — Inpatient Hospital Stay: Attending: Hematology and Oncology | Admitting: Adult Health

## 2024-03-28 ENCOUNTER — Other Ambulatory Visit: Payer: Self-pay

## 2024-03-28 ENCOUNTER — Ambulatory Visit: Attending: Surgery

## 2024-03-28 DIAGNOSIS — C50312 Malignant neoplasm of lower-inner quadrant of left female breast: Secondary | ICD-10-CM | POA: Diagnosis present

## 2024-03-28 DIAGNOSIS — M25611 Stiffness of right shoulder, not elsewhere classified: Secondary | ICD-10-CM | POA: Insufficient documentation

## 2024-03-28 DIAGNOSIS — Z170 Estrogen receptor positive status [ER+]: Secondary | ICD-10-CM | POA: Insufficient documentation

## 2024-03-28 DIAGNOSIS — M25612 Stiffness of left shoulder, not elsewhere classified: Secondary | ICD-10-CM | POA: Insufficient documentation

## 2024-03-28 DIAGNOSIS — R600 Localized edema: Secondary | ICD-10-CM | POA: Diagnosis present

## 2024-03-28 DIAGNOSIS — R0681 Apnea, not elsewhere classified: Secondary | ICD-10-CM

## 2024-03-28 DIAGNOSIS — R293 Abnormal posture: Secondary | ICD-10-CM | POA: Insufficient documentation

## 2024-03-28 DIAGNOSIS — Z86000 Personal history of in-situ neoplasm of breast: Secondary | ICD-10-CM | POA: Diagnosis present

## 2024-03-28 DIAGNOSIS — E669 Obesity, unspecified: Secondary | ICD-10-CM

## 2024-03-28 NOTE — Therapy (Signed)
 " OUTPATIENT PHYSICAL THERAPY BREAST CANCER TREATMENT   Patient Name: [name] MRN: [id number] DOB:11/29/1960, 64 y.o., female Today's Date: 03/28/2024  END OF SESSION:  PT End of Session - 03/28/24 0823     Visit Number 9    Number of Visits 12    Date for Recertification  04/04/24    Authorization Type Medicaid    Authorization Time Period 10/20-12/19/2025; [facility] authorized a date extension 12/31/23-03/30/24    Authorization - Visit Number 8    Authorization - Number of Visits 12    PT Start Time [phone]    PT Stop Time 0900    PT Time Calculation (min) 49 min    Activity Tolerance Patient tolerated treatment well    Behavior During Therapy [facility] for tasks assessed/performed          Past Medical History:  Diagnosis Date   Anxiety    Asthma    Asthma due to seasonal allergies    Breast cancer (HCC) 2009   Tamoxifen, left lumpectomy   Cough    Cramps, muscle, general    Depression    Diarrhea    Diverticulosis    Eczema    Hypertension    Palpitation    Personal history of radiation therapy    Pre-diabetes    Visual disturbance    Wheezing    Past Surgical History:  Procedure Laterality Date   ABDOMINAL HYSTERECTOMY  09/10/2005   still have ovaries   BIOPSY N/A 09/08/2014   Procedure: BIOPSY random colon;  Surgeon: Margo LITTIE Haddock, MD;  Location: [facility];  Service: Endoscopy;  Laterality: N/A;   BREAST BIOPSY Right 10/2020   Breast tissue with fibrosis.  No atypia or malignancy.   BREAST BIOPSY Left 10/26/2023   US  LT BREAST BX W LOC DEV 1ST LESION IMG BX SPEC US  GUIDE 10/26/2023 [facility] MAMMOGRAPHY   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   COLONOSCOPY WITH PROPOFOL  N/A 09/08/2014   Procedure: COLONOSCOPY WITH PROPOFOL ; in cecum at 0904; withdrawal time 16 minutes;  Surgeon: Margo LITTIE Haddock, MD;  Location: [facility];  Service: Endoscopy;  Laterality: N/A;   MASTECTOMY W/  SENTINEL NODE BIOPSY Left 11/29/2023   Procedure: MASTECTOMY WITH SENTINEL LYMPH NODE BIOPSY;  Surgeon: Vanderbilt Ned, MD;  Location: [facility];  Service: General;  Laterality: Left;  GEN w/PEC BLOCK LEFT SIMPLE MASTECTOMY LEFT SENTINEL LYMPH NODE MAPPING   TOTAL MASTECTOMY Right 11/29/2023   Procedure: MASTECTOMY, SIMPLE;  Surgeon: Vanderbilt Ned, MD;  Location: [facility];  Service: General;  Laterality: Right;  RIGHT RISK REDUCING MASTECTOMY   Patient Active Problem List   Diagnosis Date Noted   S/P mastectomy, bilateral 01/06/2024   Well woman exam with routine gynecological exam 01/03/2024   Genetic testing 11/19/2023   Malignant neoplasm of lower-inner quadrant of left breast in female, estrogen receptor positive (HCC) 11/05/2023   Non-pitting edema 09/24/2023   Generalized rash 07/04/2023   Morbid obesity (HCC) 05/06/2023   Prediabetes 10/04/2022   Anxiety and depression 09/04/2022   Asthma 09/04/2022   Essential hypertension 09/04/2022   Hyperlipidemia 09/04/2022   Hx of migraines 09/04/2022   Abdominal cramping 09/04/2022   History of breast cancer 10/06/2021   Hot flashes 10/06/2021   Abdominal bloating 10/05/2020   Encounter for screening fecal occult blood testing 10/05/2020   Encounter for gynecological examination with Papanicolaou smear of cervix  10/05/2020   S/P abdominal supracervical subtotal hysterectomy 10/05/2020   Encounter for screening colonoscopy 08/21/2014   Anxiety state 12/13/2009   CHEST PAIN 12/13/2009   Breast cancer (HCC) 2009    PCP:    REFERRING PROVIDER: Dr. Debby Shipper   REFERRING DIAG: Left breast cancer   THERAPY DIAG:  Malignant neoplasm of lower-inner quadrant of left breast in female, estrogen receptor positive (HCC)  Abnormal posture  History of ductal carcinoma in situ (DCIS) of left breast  Stiffness of right shoulder, not elsewhere classified  Stiffness of left shoulder, not elsewhere  classified  Localized edema  Rationale for Evaluation and Treatment: Rehabilitation  ONSET DATE: 10/15/2023   SUBJECTIVE:                                                                                                                                                                                           SUBJECTIVE STATEMENT: I had the flu since I was here last and felt horrible. I had a fever up to 102 and was just exhausted. I still feel like I have swelling across my chest under the incisions and along the Rt lateral trunk. I have another check up today with the surgeon this morning.    PERTINENT  HISTORY:  Patient was diagnosed on 10/15/2023 with left grade 2 invasive ductal carcinoma breast cancer. It measures 1.3 cm and is located in the lower inner quadrant. It is ER/PR positive and HER2 negative with a Ki67 of 5%. She has a history of left breast DCIS in 2009 and was treated with a lumpectomy and radiation. She is s/p Bilateral Mastectomies with left SLNB and 0+/3 LN's on 11/29/2023. She has had some difficulties with wound healing and was packing one wound each side.  PATIENT GOALS:  Reassess how my recovery is going related to arm function, pain, and swelling.  PAIN:  Are you having pain? No, just tight in my chest, Rt>Lt  PRECAUTIONS: Recent Surgery, left UE Lymphedema risk,   RED FLAGS: None   ACTIVITY LEVEL / LEISURE: resumed laundry, fixing food for her 20 yr old dad,loading and unloading dishwasher.   OBJECTIVE:   PATIENT SURVEYS:  QUICK DASH: 18%  OBSERVATIONS: 2.4 cm wound left chest area currently packed (see photo in media), right chest multiple scabs and smaller healing wound lightly packed and covered with abd pad.  POSTURE:  Forward head and rounded shoulders posture   LYMPHEDEMA ASSESSMENT:   UPPER EXTREMITY AROM/PROM:   A/PROM RIGHT   eval   RIGHT 12/31/2023 Right 02/22/24 Right 03/28/24  Shoulder extension 43 50    Shoulder flexion 155 103,  tight  151 163  Shoulder abduction 157 105 tight 143 168  Shoulder internal rotation 77 70    Shoulder external rotation 84 105                            (Blank rows = not tested)   A/PROM LEFT   eval LEFT 12/31/2023 Left 02/22/24 Left 03/28/24  Shoulder extension 46 50    Shoulder flexion 147 132 158 162  Shoulder abduction 161 145 146 172  Shoulder internal rotation 75 75    Shoulder external rotation 90 105                            (Blank rows = not tested)   CERVICAL AROM: All within normal limits   UPPER EXTREMITY STRENGTH: WNL   LYMPHEDEMA ASSESSMENTS (in cm):    LANDMARK RIGHT   eval RIGHT 12/31/2023  10 cm proximal to olecranon process 38 37  Olecranon process 28.5 28.2  10 cm proximal to ulnar styloid process 24.5 23.3  Just proximal to ulnar styloid process 15.1 15.4  Across hand at thumb web space 17.6 19.5  At base of 2nd digit 6.2 6.3  (Blank rows = not tested)   LANDMARK LEFT   eval LEFT 12/31/2023  10 cm proximal to olecranon process 38.4 38  Olecranon process 28 28.1  10 cm proximal to ulnar styloid process 22.2 22.4  Just proximal to ulnar styloid process 15.4 15.4  Across hand at thumb web space 19.4 19.7  At base of 2nd digit 6.4 6.4  (Blank rows = not tested)     Surgery type/Date: 11/29/2023 Bilateral Mastectomy, 09/25/2007 Left Lumpectomy for DCIs Number of lymph nodes removed: 0+/3 Current/past treatment (chemo, radiation, hormone therapy): Had prior radiation with lumpectomy in 2009, Took Tamoxifen 5 yrs after lumpectomy. Will start Letrozole  Other symptoms:  Heaviness/tightness Yes, chest, arm Pain Yes Pitting edema No Infections on antibiotics due to fever 1 day Decreased scar mobility Yes Stemmer sign No    TREATMENT TODAY 03/28/24: Therapeutic Exercise Pulleys into flexion x 2  min and abduction x 1 mins each with VC's to remind pt not to decrease scapular compensations Roll yellow ball up wall into flex x 10, and then bil UE  abd x 10 each returning therapist demo and VC's to decrease scap compensation Therapeutic Activities Supine on mat table for following: Bil UE horz abd x 10, bil UE scaption in a V x 10, and then bil UE abd in a snow angel x 10, 5 sec holds returning therapist demo for each, alt UE flexion x 10 each Free Motion Machine for following: Scap Retract 7# 2 x 10, Bil UE ext (machine arms at 5) 7# x 10, pt tolerated increased weight well Wall Push  Ups x 10 Doorway Pectoralis Stretch x 7 reps, 5 sec holds returning therapist demo Manual Therapy MFR across bil chest with cross hands technique horizontally and then vertically along with diagonal stretches unilaterally  STM along bil pect tendons with pts UE's in end ROM stretch with hands behind head.  02/29/24: Therapeutic Exercise Pulleys into flexion and abduction x 3 mins each with VC's to remind pt not to decrease scapular compensations Roll yellow ball up wall into flex x 10, and then bil UE abd x 10 each returning therapist demo and VC's to decrease scap compensation Therapeutic Activities Free Motion Machine for following: Scap Retract 7#  2 x 10, Bil UE ext (machine arms at 5) 3# x 10 Supine over half foam roll for following: Bil UE horz abd x 10, bil UE scaption in a V x 10, and then bil UE abd in a snow angel x 10, 5 sec holds returning therapist demo for each, alt UE flexion x 10 each Manual Therapy P/ROM to bil shoulders, into flex, abd and D2 with scapular depression by therapist throughout STM to Rt pectoralis tendon during P/ROM, gently to Lt being mindful of healing Lt incision; cupping to palpable cord in Rt forearm during MFR superior and inferior to cup on cord MFR to Rt chest wall now that this incision is closed, still not to Lt with tissue protrusion and slight drainage still present After session applied Tegaderm with nonadhesive gauze applied where cauterization to Lt incision Monday  02/22/24: Therapeutic  Exercise Pulleys into flexion and abduction x 3 mins each with VC's to remind pt hot to decrease scapular compensations Roll yellow ball up wall into flex x 10, and then bil UE abd x 10 each returning therapist demo and VC's to decrease scap compensation Therapeutic Activities Free Motion Machine for following: Scap Retract 7# x 10, Bil UE ext (machine arms at 5) 3# x 10 Supine over half foam roll for following: Bil UE horz abd x 10, bil UE scaption in a V x 10, and then bil UE abd in a snow angel x 10, 5 sec holds returning therapist demo for each Supine Scapular Series with red theraband x 5 each, pt tolerated increased resistance well so issued red band for home use. Min tactile and VC's to remind of correct technique Manual Therapy P/ROM to bil shoulders, into flex, abd and D2 with scapular depression by therapist throughout STM to Rt pectoralis tendon during P/ROM MFR to Rt chest wall now that this incision is closed, still not to Lt with tissue protrusion and slight drainage still present  02/01/24: Therapeutic Exercise Pulleys into flexion and abduction x 3 mins each with VC's to remind pt to relax during Roll yellow ball up wall into flex x 10, and then bil UE abd x 10 each returning therapist demo and VC's to decrease scap compensation Therapeutic Activities Free Motion Machine for following: Scap Retract 7# x 10, Bil UE ext (machine arms at 5) 3# x 10 Supine over half foam roll for following: Bil UE horz abd x 10, bil UE scaption in a V x 10, and then bil UE abd in a snow angel x 10, 5 sec holds returning therapist demo for each Supine Scapular Series with yellow theraband x 5 reps each returning therapist demo, held off on Lt UE D2 due to still healing incision and did not want to create unnecessary pull here.  Manual Therapy P/ROM to Rt shoulder only today as time allowed with adding new exercises, into flex, abd and D2 with scapular depression by therapist throughout STM to  Rt pectoralis tendon during P/ROM Issued nonadhesive gauze and Tegaderm for pt to place on Lt incision after she sees Dr. Vanderbilt this morning.         PATIENT EDUCATION:  Education details: SOZO screens, need to hold scar massage until healed, have brother check wound area while exercising to be sure no opening of wound area during exs.(none noted today), ABC video watch and we will discuss Person educated: Patient Education method: Explanation, Demonstration, and Handouts Education comprehension: verbalized understanding and returned demonstration  HOME EXERCISE PROGRAM: Reviewed previously  given post op HEP. Performed shoulder flexion, and stargazer x 5 in supine while observing wound area, scapular retraction, standing wall slides.   ASSESSMENT:  CLINICAL IMPRESSION: Pt got sick over the holiday with the flu so has missed some appts due to this However, her incisions are now finally completely healed and she is doing well with her recovery at this point and has met all goals. She is ready for D/C. Forgot to do SOZO with pt today but called her and let her know that next time she is in [place] to call us  and we can get her in to reassess this.   EVAL Pt is s/p Bilateral Mastectomies with left SLNB on 11/29/2023. She had a prior left lumpectomy for DCIS in 2009. She presents with limitations in shoulder ROM, Right greater than left.  She has poor wound healing and has a 2.5 cm area on the left chest that she is packing, and a smaller, more superficial wound on the right chest area, with multiple scabs noted full length.  See Photos in Media.Wound area was observed and there was no opening of wound area noted with Pt performing AAROM for flexion and stargazer. Pt will benefit from skilled therapeutic intervention to improve on the following deficits: Decreased knowledge of precautions, impaired UE functional use, pain, decreased ROM, and postural dysfunction.   PT  treatment/interventions: ADL/Self care home management, [phone]- PT Re-evaluation, 97110-Therapeutic exercises, 97530- Therapeutic activity, [id number]- Neuromuscular re-education, 97535- Self Care, [id number]- Manual therapy, [id number]- Orthotic Initial, and [id number]- Orthotic/Prosthetic subsequent   GOALS: Goals reviewed with patient? Yes  GOALS MET AT EVAL:  GOALS Name Target Date Goal status  1 Pt will be able to verbalize understanding of pertinent lymphedema risk reduction practices relevant to her dx specifically related to skin care.  Baseline:  No knowledge Eval Achieved at eval  2 Pt will be able to return demo and/or verbalize understanding of the post op HEP related to regaining shoulder ROM. Baseline:  No knowledge Eval Achieved at eval  3 Pt will be able to verbalize understanding of the importance of viewing the post op After Breast CA Class video for further lymphedema risk reduction education and therapeutic exercise.  Baseline:  No knowledge Eval Achieved at eval   LONG TERM GOALS:  (STG=LTG)  GOALS Name Target Date  Goal status  1 Pt will demonstrate she has regained full shoulder ROM and function post operatively compared to baselines.  Baseline: 04/04/2024  MET 02/22/24 see above measurements; 03/28/24 see measurements  2 Pt will watch ABC video and will have all questions answered 04/04/2024 ONGOING Pt to watch this soon, she had forgotten about video  3 Pt will be independent with HEP to improve ROM and gentle strength of bilateral UE's 02/04/2024 MET 02/22/24 - pt is independent with current HEP, will be able to progress her now that incisions are mostly healed     PLAN:  PT FREQUENCY/DURATION: 1x/week x 6 weeks  PLAN FOR NEXT SESSION: D/C this visit. Cont every 3 month L-Dex screen. Until 11/2025, then transition to every 6 months until 11/2027.    [facility]  [address], [address]  [place] [address]  [phone]    Aden Berwyn Caldron,  PTA 03/28/2024, 12:29 PM   Over Head Pull: Narrow and Wide Grip   Cancer Rehab [phone]   On back, knees bent, feet flat, band across thighs, elbows straight but relaxed. Pull hands apart (start). Keeping elbows straight, bring arms up  and over head, hands toward floor. Keep pull steady on band. Hold momentarily. Return slowly, keeping pull steady, back to start. Then do same with a wider grip on the band (past shoulder width) Repeat _5-10__ times. Band color __yellow____   Side Pull: Double Arm   On back, knees bent, feet flat. Arms perpendicular to body, shoulder level, elbows straight but relaxed. Pull arms out to sides, elbows straight. Resistance band comes across collarbones, hands toward floor. Hold momentarily. Slowly return to starting position. Repeat _5-10__ times. Band color _yellow____   Sword   On back, knees bent, feet flat, left hand on left hip, right hand above left. Pull right arm DIAGONALLY (hip to shoulder) across chest. Bring right arm along head toward floor. Hold momentarily. Slowly return to starting position. Repeat _5-10__ times. Do with left arm. Band color _yellow_____   Shoulder Rotation: Double Arm   On back, knees bent, feet flat, elbows tucked at sides, bent 90, hands palms up. Pull hands apart and down toward floor, keeping elbows near sides. Hold momentarily. Slowly return to starting position. Repeat _5-10__ times. Band color __yellow____  "

## 2024-04-03 ENCOUNTER — Encounter: Payer: Self-pay | Admitting: Hematology and Oncology

## 2024-04-09 ENCOUNTER — Inpatient Hospital Stay: Attending: Adult Health | Admitting: Adult Health

## 2024-04-09 ENCOUNTER — Other Ambulatory Visit: Payer: Self-pay

## 2024-04-09 ENCOUNTER — Encounter: Payer: Self-pay | Admitting: Adult Health

## 2024-04-09 VITALS — BP 136/85 | HR 110 | Temp 99.3°F | Resp 18 | Ht 69.0 in | Wt 246.5 lb

## 2024-04-09 DIAGNOSIS — C50312 Malignant neoplasm of lower-inner quadrant of left female breast: Secondary | ICD-10-CM

## 2024-04-09 DIAGNOSIS — Z170 Estrogen receptor positive status [ER+]: Secondary | ICD-10-CM

## 2024-04-09 MED ORDER — CLOBETASOL PROPIONATE 0.05 % EX OINT: TOPICAL_OINTMENT | CUTANEOUS | 0 refills | Status: AC

## 2024-04-09 NOTE — Progress Notes (Signed)
 Pharmacy sent of a clarification request to be sent back with new prescription for Clobetasol  propionate 0.05% external ointment.

## 2024-04-09 NOTE — Progress Notes (Signed)
 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) (Resolved)  09/25/2007 Surgery   Left breast lumpectomy: DCIS ER/PR positive   11/04/2007 - 12/17/2007 Radiation Therapy   Adjuvant radiation therapy   03/26/2008 - 03/27/2013 Anti-estrogen oral therapy   Tamoxifen 20 mg daily   10/26/2023 Pathology Results   LEFT BREAST BIOPSY: grade 2 IDC; ER/PR+   12/2023 -  Anti-estrogen oral therapy   Letrozole  daily   Malignant neoplasm of lower-inner quadrant of left breast in female, estrogen receptor positive (HCC)  10/26/2023 Pathology Results   LEFT BREAST BIOPSY: grade 2 IDC; ER/PR+   11/07/2023 Cancer Staging   Staging form: Breast, AJCC 8th Edition - Clinical stage from 11/07/2023: Stage IA (cT1c, cN0, cM0, G2, ER+, PR+, HER2-) - Signed by Loretha Ash, MD on 11/07/2023 Stage prefix: Initial diagnosis Histologic grading system: 3 grade system Laterality: Left Staged by: Pathologist and managing physician Stage used in treatment planning: Yes National guidelines used in treatment planning: Yes Type of national guideline used in treatment planning: NCCN    Genetic Testing   Negative CancerNext-Expanded +RNAinsight. VUS in POLE. The CancerNext-Expanded gene panel offered by [facility] and includes sequencing, rearrangement, and RNA analysis for the following 77 genes: AIP, ALK, APC, ATM, AXIN2, BAP1, BARD1, BMPR1A, BRCA1, BRCA2, BRIP1, CDC73, CDH1, CDK4, CDKN1B, CDKN2A, CEBPA, CHEK2, CTNNA1, DDX41, DICER1, EGFR, EPCAM, ETV6, FH, FLCN, GATA2, GREM1, HOXB13, KIT, LZTR1, MAX, MBD4, MEN1, MET, MITF, MLH1, MSH2, MSH3, MSH6, MUTYH, NF1, NF2, NTHL1, PALB2, PDGFRA, PHOX2B, PMS2, POLD1, POLE, POT1, PRKAR1A, PTCH1, PTEN, RAD51C, RAD51D, RB1, RET, RPS20, RUNX1, SDHA, SDHAF2, SDHB, SDHC, SDHD, SMAD4, SMARCA4, SMARCB1, SMARCE1, STK11, SUFU, TMEM127, TP53, TSC1, TSC2, VHL, WT1. Report date 11/15/23.    11/29/2023 Surgery    Bilateral mastectomies: right breast benign, left breast, IDC,3.2 cm, grade 2, margins negative, 3 SLN negative.    11/29/2023 Oncotype testing   21/7%   11/29/2023 Cancer Staging   Staging form: Breast, AJCC 8th Edition - Pathologic stage from 11/29/2023: Stage IA (pT2, pN0, cM0, G2, ER+, PR+, HER2-) - Signed by Crawford Morna Pickle, NP on 04/09/2024 Histologic grading system: 3 grade system   12/2023 -  Anti-estrogen oral therapy   Letrozole  daily     INTERVAL HISTORY:   Discussed the use of AI scribe software for clinical note transcription with the patient, who gave verbal consent to proceed.  History of Present Illness [name] is a 64 year old female with recurrent stage IA, ER/PR-positive left breast cancer who presents for oncology follow-up.  She has recurrent ER/PR-positive left breast cancer, initially stage IA, treated with bilateral mastectomies and excision of three lymph nodes, all negative. Genetic testing was negative for known cancer genes, with a variant of uncertain significance that she still finds worrisome. She started letrozole  in September 2025 after prior tamoxifen in 2009 and is aware of side effects. She has new facial hair growth and melasma since starting letrozole .  She had let me know that she experienced a setback after her November 29, 2023 surgery, with two deep wounds and one slow-healing site that required chemical cauterization. This delayed return to physical activity. She has now resumed exercise including kickboxing and gym workouts. She is distressed about her weight, recovery course, and related mood symptoms.  She gained weight after stopping tirzepatide  in October 2025 when insurance stopped coverage, after previously losing over 40 pounds on it. She is motivated to lose weight and has a sleep  study scheduled on April 23, 2024 for possible sleep apnea. She is exploring compounded weight loss medications and lifestyle changes and  wants to understand how weight loss may affect recurrence risk for her estrogen-driven cancer.  She has high anxiety about current and future insurance coverage and access to cancer treatments and medications, including tirzepatide . She requested more intensive surveillance and agreed to Guardant Reveal ctDNA testing for early detection of recurrence, with mobile phlebotomy arranged because of insurance and access concerns.  She has extensive paternal family history of breast and other cancers, including an aunt who died of breast cancer at 64 and a niece with breast cancer at 106 on chemotherapy and radiation. She is worried about her personal risk in this context. She quit tobacco about 35 years ago.    REVIEW OF SYSTEMS:  Review of Systems  Constitutional:  Positive for fatigue. Negative for appetite change, chills, fever and unexpected weight change.  HENT:   Negative for hearing loss, lump/mass and trouble swallowing.   Eyes:  Negative for eye problems and icterus.  Respiratory:  Negative for chest tightness, cough and shortness of breath.   Cardiovascular:  Negative for chest pain, leg swelling and palpitations.  Gastrointestinal:  Negative for abdominal distention, abdominal pain, constipation, diarrhea, nausea and vomiting.  Endocrine: Negative for hot flashes.  Genitourinary:  Negative for difficulty urinating.   Musculoskeletal:  Negative for arthralgias.  Skin:  Negative for itching and rash.  Neurological:  Negative for dizziness, extremity weakness, headaches and numbness.  Hematological:  Negative for adenopathy. Does not bruise/bleed easily.  Psychiatric/Behavioral:  Negative for depression. The patient is nervous/anxious.   Breast: Denies any new nodularity, masses, tenderness, nipple changes, or nipple discharge.       PAST MEDICAL/SURGICAL HISTORY:  Past Medical History:  Diagnosis Date   Anxiety    Asthma    Asthma due to seasonal allergies    Breast cancer (HCC)  2009   Tamoxifen, left lumpectomy   Cough    Cramps, muscle, general    Depression    Diarrhea    Diverticulosis    Eczema    Hypertension    Palpitation    Personal history of radiation therapy    Pre-diabetes    Visual disturbance    Wheezing    Past Surgical History:  Procedure Laterality Date   ABDOMINAL HYSTERECTOMY  09/10/2005   still have ovaries   BIOPSY N/A 09/08/2014   Procedure: BIOPSY random colon;  Surgeon: Margo LITTIE Haddock, MD;  Location: [facility];  Service: Endoscopy;  Laterality: N/A;   BREAST BIOPSY Right 10/2020   Breast tissue with fibrosis.  No atypia or malignancy.   BREAST BIOPSY Left 10/26/2023   US  LT BREAST BX W LOC DEV 1ST LESION IMG BX SPEC US  GUIDE 10/26/2023 [facility] MAMMOGRAPHY   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   COLONOSCOPY WITH PROPOFOL  N/A 09/08/2014   Procedure: COLONOSCOPY WITH PROPOFOL ; in cecum at 0904; withdrawal time 16 minutes;  Surgeon: Margo LITTIE Haddock, MD;  Location: [facility];  Service: Endoscopy;  Laterality: N/A;   MASTECTOMY W/ SENTINEL NODE BIOPSY Left 11/29/2023   Procedure: MASTECTOMY WITH SENTINEL LYMPH NODE BIOPSY;  Surgeon: Vanderbilt Ned, MD;  Location: [facility];  Service: General;  Laterality: Left;  GEN w/PEC BLOCK LEFT SIMPLE MASTECTOMY LEFT SENTINEL LYMPH NODE MAPPING   TOTAL MASTECTOMY Right 11/29/2023   Procedure: MASTECTOMY, SIMPLE;  Surgeon: Vanderbilt Ned, MD;  Location: [facility];  Service: General;  Laterality: Right;  RIGHT RISK REDUCING MASTECTOMY     ALLERGIES:  Allergies[1]   CURRENT MEDICATIONS:  Outpatient Encounter Medications as of 04/09/2024  Medication Sig   albuterol  (PROVENTIL ) (2.5 MG/3ML) 0.083% nebulizer solution Take 3 mLs (2.5 mg total) by nebulization every 6 (six) hours as needed for wheezing or shortness of breath.   albuterol  (VENTOLIN  HFA) 108 (90 Base) MCG/ACT inhaler  Inhale 2 puffs into the lungs every 6 (six) hours as needed for wheezing or shortness of breath.   Albuterol -Budesonide  (AIRSUPRA ) 90-80 MCG/ACT AERO Inhale 2 puffs into the lungs every 4 (four) hours as needed.   Albuterol -Budesonide  (AIRSUPRA ) 90-80 MCG/ACT AERO Inhale 2 puffs into the lungs every 4 (four) hours as needed.   ALPHA LIPOIC ACID PO Take by mouth.   aspirin  EC 81 MG tablet Take 1 tablet (81 mg total) by mouth daily. Swallow whole.   DULoxetine  (CYMBALTA ) 60 MG capsule Take 1 capsule by mouth once daily   ELDERBERRY PO Take by mouth.   EPINEPHrine  0.3 mg/0.3 mL IJ SOAJ injection Inject 0.3 mg into the muscle as needed for anaphylaxis.   Glucosamine HCl (GLUCOSAMINE PO) Take by mouth.   hydrochlorothiazide  (HYDRODIURIL ) 50 MG tablet Take 1 tablet by mouth once daily   ibuprofen  (ADVIL ) 800 MG tablet Take 1 tablet (800 mg total) by mouth every 8 (eight) hours as needed.   letrozole  (FEMARA ) 2.5 MG tablet Take 1 tablet (2.5 mg total) by mouth daily.   LORazepam  (ATIVAN ) 0.5 MG tablet TAKE 1 TABLET BY MOUTH EVERY 8 HOURS AS NEEDED FOR ANXIETY   LYSINE PO Take by mouth.   methocarbamol  (ROBAXIN ) 500 MG tablet Take 1 tablet (500 mg total) by mouth every 8 (eight) hours as needed for muscle spasms.   Multiple Vitamins-Minerals (MULTIVITAMIN WITH MINERALS) tablet Take 1 tablet by mouth daily. Vitamin D3 is in it (1,000Ius)   OIL OF OREGANO PO Take by mouth.   OVER THE COUNTER MEDICATION Take by mouth daily. Sugar Blockers   oxyCODONE  (OXY IR/ROXICODONE ) 5 MG immediate release tablet Take 1 tablet (5 mg total) by mouth every 6 (six) hours as needed for severe pain (pain score 7-10).   phentermine  37.5 MG capsule Take 1 capsule (37.5 mg total) by mouth every morning.   potassium chloride SA (K-DUR,KLOR-CON) 20 MEQ tablet Take 20 mEq by mouth daily.   Probiotic Product (PROBIOTIC-10 PO) Take 10 mg by mouth daily. Prebiotic/Probiotic   rosuvastatin  (CRESTOR ) 20 MG tablet TAKE 1 TABLET BY  MOUTH AT BEDTIME   sulfamethoxazole-trimethoprim (BACTRIM DS) 800-160 MG tablet SMARTSIG:1 Tablet(s) By Mouth Every 12 Hours   tirzepatide  (ZEPBOUND ) 15 MG/0.5ML Pen Inject 15 mg into the skin once a week.   [DISCONTINUED] clobetasol  ointment (TEMOVATE ) 0.05 % APPLY 1 APPLICATION TOPICALLY TWO TIMES DAILY. USE FOR 2- 3 WEEKS TOPS.   No facility-administered encounter medications on file as of 04/09/2024.     ONCOLOGIC FAMILY HISTORY:  Family History  Problem Relation Age of Onset   Diabetes Mother    Stroke Mother    Hypertension Mother    Other Mother 28       Smoldering myeloma   Macular degeneration Mother    Congestive Heart Failure Mother    Diabetes Father    Hypertension Sister    Hypertension Brother    Prostate cancer Brother 52   Prostate cancer Brother 33   Prostate cancer Maternal Uncle 19  Ovarian cancer Paternal Aunt    Breast cancer Paternal Aunt 81   Breast cancer Niece 22       bilateral   Lung cancer Paternal Cousin        smoked, first cousin   Breast cancer Paternal Cousin 56       first cousin once removed   Pancreatic cancer Paternal Cousin    Prostate cancer Maternal Cousin 42       first cousin once removed     SOCIAL HISTORY:  Social History   Socioeconomic History   Marital status: Divorced    Spouse name: Not on file   Number of children: Not on file   Years of education: Not on file   Highest education level: Not on file  Occupational History   Occupation: Social Services  Tobacco Use   Smoking status: Former    Current packs/day: 0.00    Types: Cigarettes    Quit date: 03/02/1990    Years since quitting: 34.1   Smokeless tobacco: Never  Vaping Use   Vaping status: Never Used  Substance and Sexual Activity   Alcohol use: No    Alcohol/week: 0.0 standard drinks of alcohol   Drug use: No   Sexual activity: Not Currently    Birth control/protection: Surgical    Comment: hyst  Other Topics Concern   Not on file  Social  History Narrative   Not on file   Social Drivers of Health   Tobacco Use: Medium Risk (04/09/2024)   Patient History    Smoking Tobacco Use: Former    Smokeless Tobacco Use: Never    Passive Exposure: Not on Actuary Strain: Not on file  Food Insecurity: No Food Insecurity (11/07/2023)   Epic    Worried About Programme Researcher, Broadcasting/film/video in the Last Year: Never true    Ran Out of Food in the Last Year: Never true  Transportation Needs: No Transportation Needs (11/07/2023)   Epic    Lack of Transportation (Medical): No    Lack of Transportation (Non-Medical): No  Physical Activity: Not on file  Stress: Not on file  Social Connections: Not on file  Intimate Partner Violence: Not At Risk (11/07/2023)   Epic    Fear of Current or Ex-Partner: No    Emotionally Abused: No    Physically Abused: No    Sexually Abused: No  Depression (PHQ2-9): Low Risk (01/04/2024)   Depression (PHQ2-9)    PHQ-2 Score: 3  Alcohol Screen: Not on file  Housing: Low Risk (11/07/2023)   Epic    Unable to Pay for Housing in the Last Year: No    Number of Times Moved in the Last Year: 0    Homeless in the Last Year: No  Utilities: Not At Risk (11/07/2023)   Epic    Threatened with loss of utilities: No  Health Literacy: Not on file     OBSERVATIONS/OBJECTIVE:  BP 136/85 (BP Location: Left Arm, Patient Position: Sitting)   Pulse (!) 110   Temp 99.3 F (37.4 C) (Tympanic)   Resp 18   Ht 5' 9 (1.753 m)   Wt 246 lb 8 oz (111.8 kg)   SpO2 100%   BMI 36.40 kg/m  GENERAL: Patient is a well appearing female in no acute distress HEENT:  Sclerae anicteric.  Oropharynx clear and moist. No ulcerations or evidence of oropharyngeal candidiasis. Neck is supple.  NODES:  No cervical, supraclavicular, or axillary lymphadenopathy palpated.  BREAST EXAM:  s/p bilateral mastectomies, no sign of local recurrence LUNGS:  Clear to auscultation bilaterally.  No wheezes or rhonchi. HEART:  Regular rate and  rhythm. No murmur appreciated. ABDOMEN:  Soft, nontender.  Positive, normoactive bowel sounds. No organomegaly palpated. MSK:  No focal spinal tenderness to palpation. Full range of motion bilaterally in the upper extremities. EXTREMITIES:  No peripheral edema.   SKIN:  Clear with no obvious rashes or skin changes. No nail dyscrasia. NEURO:  Nonfocal. Well oriented.  Appropriate affect.   LABORATORY DATA:  None for this visit.  DIAGNOSTIC IMAGING:  None for this visit.      ASSESSMENT AND PLAN:  Ms.. Noyes is a pleasant 64 y.o. female with Stage IA left breast invasive ductal carcinoma, ER+/PR+/HER2-, diagnosed in 10/2023, treated with bilateral mastectomies and anti-estrogen therapy with Letrozole  beginning in 12/2023.  She presents to the Survivorship Clinic for our initial meeting and routine follow-up post-completion of treatment for breast cancer.    1. Stage IA left breast cancer:  Ms. Omara is continuing to recover from definitive treatment for breast cancer. She will follow-up with her medical oncologist, Dr. Loretha in 6 months  with history and physical exam per surveillance protocol.  She will continue her anti-estrogen therapy with Letrozole . Thus far, she is tolerating the Letrozole  well, with minimal side effects.  We discussed Guardant Reveal testing today and I asked my nurse to send in orders for testing.  Today, a comprehensive survivorship care plan and treatment summary was reviewed with the patient today detailing her breast cancer diagnosis, treatment course, potential late/long-term effects of treatment, appropriate follow-up care with recommendations for the future, and patient education resources.  A copy of this summary, along with a letter will be sent to the patients primary care provider via mail/fax/In Basket message after todays visit.    2. Bone health:  Given Ms. Counce's age/history of breast cancer and her current treatment regimen including anti-estrogen  therapy with Letrozole , she is at risk for bone demineralization.  Her last DEXA scan was 03/2024 and was normal.  I recommended repeat in 03/2026.  She was given education on specific activities to promote bone health.  3. Cancer screening:  Due to Ms. Whirley's history and her age, she should receive screening for skin cancers, colon cancer, and gynecologic cancers.  The information and recommendations are listed on the patient's comprehensive care plan/treatment summary and were reviewed in detail with the patient.    4. Health maintenance and wellness promotion: Ms. Mccole was encouraged to consume 5-7 servings of fruits and vegetables per day. We reviewed the Nutrition Rainbow handout.  She was also encouraged to engage in moderate to vigorous exercise for 30 minutes per day most days of the week.  She was instructed to limit her alcohol consumption and continue to abstain from tobacco use.     5. Support services/counseling: It is not uncommon for this period of the patient's cancer care trajectory to be one of many emotions and stressors.   She was given information regarding our available services and encouraged to contact me with any questions or for help enrolling in any of our support group/programs.    Follow up instructions:    -Return to cancer center in 6 months for f/u with Dr. Loretha  Vickey reveal testing every 6 months -DEXA in 03/2026 -She is welcome to return back to the Survivorship Clinic at any time; no additional follow-up needed at this time.  -Consider referral back to survivorship as  a long-term survivor for continued surveillance  The patient was provided an opportunity to ask questions and all were answered. The patient agreed with the plan and demonstrated an understanding of the instructions.   Total encounter time:60 minutes*in face-to-face visit time, chart review, lab review, care coordination, order entry, and documentation of the encounter  time.    Morna Kendall, NP 04/14/24 10:43 AM Medical Oncology and Hematology [facility] [address] [place], [place] [address] Tel. [phone]    Fax. [phone]  *Total Encounter Time as defined by the Centers for Medicare and Medicaid Services includes, in addition to the face-to-face time of a patient visit (documented in the note above) non-face-to-face time: obtaining and reviewing outside history, ordering and reviewing medications, tests or procedures, care coordination (communications with other health care professionals or caregivers) and documentation in the medical record.     [1]  Allergies Allergen Reactions   Ceclor [Cefaclor] Other (See Comments)    Causes pain in esophagus   Erythromycin Hives and Itching   Green Tea (Camellia Sinensis) Other (See Comments)    Eyes swell   Latex     Welts    Other Itching    pineapple   Penicillins Hives and Itching   Tetracycline Hives and Itching   Lisinopril Other (See Comments)    Internal pain   Passion Fruit Flavoring Agent (Non-Screening)    Amoxicillin Hives   Pineapple Itching   Tetracyclines & Related Nausea Only

## 2024-04-18 ENCOUNTER — Other Ambulatory Visit: Payer: Self-pay

## 2024-04-18 DIAGNOSIS — F32A Depression, unspecified: Secondary | ICD-10-CM

## 2024-04-18 MED ORDER — LORAZEPAM 0.5 MG PO TABS: 0.5000 mg | ORAL_TABLET | Freq: Three times a day (TID) | ORAL | 0 refills | Status: AC | PRN

## 2024-04-18 NOTE — Telephone Encounter (Signed)
 We cannot put refills on controlled medications.

## 2024-04-18 NOTE — Telephone Encounter (Signed)
Lorazepam sent to [facility] pharmacy.

## 2024-04-23 ENCOUNTER — Institutional Professional Consult (permissible substitution): Admitting: Neurology

## 2024-05-16 ENCOUNTER — Ambulatory Visit: Admitting: Allergy & Immunology

## 2024-10-07 ENCOUNTER — Inpatient Hospital Stay: Admitting: Hematology and Oncology
